# Patient Record
Sex: Male | Born: 1935 | Race: White | Hispanic: No | Smoking: Current every day smoker
Health system: Southern US, Community
[De-identification: ages and names within clinical notes are randomized; demographics above are authoritative.]

## PROBLEM LIST (undated history)

## (undated) DIAGNOSIS — M1990 Unspecified osteoarthritis, unspecified site: Secondary | ICD-10-CM

## (undated) DIAGNOSIS — I10 Essential (primary) hypertension: Secondary | ICD-10-CM

## (undated) DIAGNOSIS — K219 Gastro-esophageal reflux disease without esophagitis: Secondary | ICD-10-CM

## (undated) DIAGNOSIS — F449 Dissociative and conversion disorder, unspecified: Secondary | ICD-10-CM

## (undated) DIAGNOSIS — F442 Dissociative stupor: Secondary | ICD-10-CM

## (undated) DIAGNOSIS — F419 Anxiety disorder, unspecified: Secondary | ICD-10-CM

## (undated) DIAGNOSIS — I2510 Atherosclerotic heart disease of native coronary artery without angina pectoris: Secondary | ICD-10-CM

## (undated) DIAGNOSIS — G4730 Sleep apnea, unspecified: Secondary | ICD-10-CM

## (undated) DIAGNOSIS — R569 Unspecified convulsions: Secondary | ICD-10-CM

## (undated) DIAGNOSIS — I639 Cerebral infarction, unspecified: Secondary | ICD-10-CM

## (undated) HISTORY — PX: TONSILLECTOMY: SUR1361

## (undated) HISTORY — PX: OTHER SURGICAL HISTORY: SHX169

---

## 1992-02-17 DIAGNOSIS — G47411 Narcolepsy with cataplexy: Secondary | ICD-10-CM | POA: Insufficient documentation

## 1992-02-17 DIAGNOSIS — F602 Antisocial personality disorder: Secondary | ICD-10-CM | POA: Insufficient documentation

## 2000-03-03 DIAGNOSIS — M779 Enthesopathy, unspecified: Secondary | ICD-10-CM | POA: Insufficient documentation

## 2011-10-07 ENCOUNTER — Encounter (HOSPITAL_COMMUNITY): Payer: Self-pay | Admitting: Emergency Medicine

## 2011-10-07 ENCOUNTER — Emergency Department (HOSPITAL_COMMUNITY)
Admission: EM | Admit: 2011-10-07 | Discharge: 2011-10-08 | Disposition: A | Discharge status: Home / Self Care | Payer: Medicare Other | Attending: Emergency Medicine | Admitting: Emergency Medicine

## 2011-10-07 DIAGNOSIS — R10819 Abdominal tenderness, unspecified site: Secondary | ICD-10-CM | POA: Insufficient documentation

## 2011-10-07 DIAGNOSIS — R339 Retention of urine, unspecified: Secondary | ICD-10-CM | POA: Insufficient documentation

## 2011-10-07 HISTORY — DX: Dissociative and conversion disorder, unspecified: F44.9

## 2011-10-07 HISTORY — DX: Gastro-esophageal reflux disease without esophagitis: K21.9

## 2011-10-07 HISTORY — DX: Dissociative stupor: F44.2

## 2011-10-07 HISTORY — DX: Sleep apnea, unspecified: G47.30

## 2011-10-07 HISTORY — DX: Anxiety disorder, unspecified: F41.9

## 2011-10-07 LAB — RAPID URINE DRUG SCREEN, HOSP PERFORMED
Amphetamines: NOT DETECTED
Barbiturates: NOT DETECTED
Benzodiazepines: NOT DETECTED
Cocaine: NOT DETECTED
Opiates: NOT DETECTED
Tetrahydrocannabinol: NOT DETECTED

## 2011-10-07 MED ORDER — TAMSULOSIN HCL 0.4 MG PO CAPS: 0.4000 mg | ORAL_CAPSULE | Freq: Every day | ORAL | Status: DC

## 2011-10-07 NOTE — ED Notes (Signed)
Pt stated that in his urine is cloudy and it is d/t the particles in his couch where his dog sleeps.  States he should just shoot his dog, but won't b/c he is closer to his dog than any human being.

## 2011-10-07 NOTE — ED Notes (Signed)
Pt acting very angry, verbally abusing the undersigned about not knowing about the history he is describing. This includes "cataplectic attacks", narcolepsy, and sleep apnea.  Apparently the "cataplectic attack" is what the pt was having when he was being assessed.  Pt seems to be oriented but goes into describing how he is a disabled vet, how he has been put through so much, and that "no one gives a damn"  That "my penis has been put through this so much and that the [place] and everyone else in the medical field is just there to get a paycheck"

## 2011-10-07 NOTE — Discharge Instructions (Signed)
Acute Urinary Retention, Male You have been seen by a caregiver today because of your inability to urinate (pass your water). This is a common problem in elderly males. As men age their prostates become larger and block the flow of urine from the bladder. This is usually a problem that has come on gradually. It is often first noticed by having to get up at night to urinate. This is because as the prostate enlarges it is more difficult to empty the bladder completely. Treatment may involve a one time catheterization to empty the bladder. This is putting in a tube to drain your urine. Then you and your personal caregiver can decide at your earliest convenience how to handle this problem in the future. It may also be a problem that may not recur for years. Sometimes this problem can be caused by medications. In this case, all that is often necessary is to discontinue the offending agent. If you are to leave the foley catheter (a long, narrow, hollow tube) in and go home with a drainage system, you will need to discuss the best course of action with your caregiver. While the catheter is in, maintain a good intake of fluids. Keep the drainage bag emptied and lower than your catheter. This is so contaminated (infected) urine will not be flowing back into your bladder. This could lead to a urinary tract infection. Only take over-the-counter or prescription medicines for pain, discomfort, or fever as directed by your caregiver.  SEEK IMMEDIATE MEDICAL CARE IF:  You develop chills, fever, or show signs of generalized illness that occurs prior to seeing your caregiver. Document Released: 10/06/2000 Document Revised: 06/19/2011 Document Reviewed: 06/21/2008 Exit[employer] Patient Information 2012 [employer]. 

## 2011-10-07 NOTE — ED Provider Notes (Signed)
History    46 male with urinary retention. Ongoing issue for past 2 weeks. Has had foley catheter placed at times. Most recently removed himself today around 1900 because he was frustrated with it. Pt states that he has had care for this at multiple different facilities including ER in [place], [place] [facility] and  [facility]. Has not noticed any blood in urine. No back pain. No fever or chills. No n/v.  CSN: [id number]  Arrival date & time 10/07/11  2131   First MD Initiated Contact with Patient 10/07/11 2233      Chief Complaint  Patient presents with  . Urinary Retention    (Consider location/radiation/quality/duration/timing/severity/associated sxs/prior treatment) HPI  Past Medical History  Diagnosis Date  . Acid reflux   . Anxiety   . Sleep apnea   . Narcolepsy     Past Surgical History  Procedure Date  . Arm surgery      History reviewed. No pertinent family history.  History  Substance Use Topics  . Smoking status: Never Smoker   . Smokeless tobacco: Not on file  . Alcohol Use: No      Review of Systems   Review of symptoms negative unless otherwise noted in HPI.   Allergies  Review of patient's allergies indicates no known allergies.  Home Medications  No current outpatient prescriptions on file.  BP 149/93  Pulse 110  Temp(Src) 98.1 F (36.7 C) (Oral)  Resp 20  SpO2 98%  Physical Exam  Nursing note and vitals reviewed. Constitutional: He appears well-developed and well-nourished. No distress.  HENT:  Head: Normocephalic and atraumatic.  Eyes: Conjunctivae are normal. Right eye exhibits no discharge. Left eye exhibits no discharge.  Neck: Neck supple.  Cardiovascular: Normal rate, regular rhythm and normal heart sounds.  Exam reveals no gallop and no friction rub.   No murmur heard. Pulmonary/Chest: Effort normal and breath sounds normal. No respiratory distress.  Abdominal: Soft. He exhibits no distension. There is tenderness.        Mild lower abdominal tenderness without rebound or guarding.  Genitourinary:       Normal appearing external male genitalia. No penile discharge. No inguinal adenopathy. No external lesions noted. No testicular tenderness. No scrotal edema.  Musculoskeletal: He exhibits no edema and no tenderness.  Neurological: He is alert.  Skin: Skin is warm and dry.  Psychiatric:       Very bizarre affect. Will become very quiet at times and speak softly. Says having "cataplectic attacks."    ED Course  Procedures (including critical care time)   Labs Reviewed  URINALYSIS, ROUTINE W REFLEX MICROSCOPIC  URINE RAPID DRUG SCREEN (HOSP PERFORMED)   No results found.   1. Urinary retention       MDM  76yM with urinary retention. Foley placed. Pt reports seeing [facility] 2 weeks ago although no recent notes seen in system. Question reliability of pt as historian. Catheter care discussed. Referral for urology provided. Tamsulosin. If UA suggestive of infection will also tx with abx.         Raeford Razor, MD 10/07/11 2351

## 2011-10-07 NOTE — ED Notes (Signed)
Pt states he is having urinary retention  Pt states he had a catheter in place but it was not draining so he removed it himself and has not been able to urinate since

## 2011-10-08 LAB — URINALYSIS, ROUTINE W REFLEX MICROSCOPIC
Bilirubin Urine: NEGATIVE
Glucose, UA: NEGATIVE mg/dL
Hgb urine dipstick: NEGATIVE
Ketones, ur: NEGATIVE mg/dL
Nitrite: NEGATIVE
Protein, ur: NEGATIVE mg/dL
Specific Gravity, Urine: 1.024 (ref 1.005–1.030)
Urobilinogen, UA: 1 mg/dL (ref 0.0–1.0)
pH: 6 (ref 5.0–8.0)

## 2011-10-08 LAB — URINE MICROSCOPIC-ADD ON

## 2013-02-11 HISTORY — PX: ABDOMINAL AORTIC ANEURYSM REPAIR: SUR1152

## 2013-08-27 ENCOUNTER — Emergency Department (HOSPITAL_COMMUNITY): Payer: Medicare Other

## 2013-08-27 ENCOUNTER — Inpatient Hospital Stay (HOSPITAL_COMMUNITY)
Admission: EM | Admit: 2013-08-27 | Discharge: 2013-09-01 | DRG: 981 | Disposition: A | Discharge status: Home Health | Payer: Medicare Other | Attending: Family Medicine | Admitting: Family Medicine

## 2013-08-27 ENCOUNTER — Encounter (HOSPITAL_COMMUNITY): Payer: Self-pay | Admitting: Emergency Medicine

## 2013-08-27 DIAGNOSIS — H35039 Hypertensive retinopathy, unspecified eye: Secondary | ICD-10-CM | POA: Diagnosis present

## 2013-08-27 DIAGNOSIS — G47419 Narcolepsy without cataplexy: Secondary | ICD-10-CM | POA: Diagnosis present

## 2013-08-27 DIAGNOSIS — H3410 Central retinal artery occlusion, unspecified eye: Principal | ICD-10-CM | POA: Diagnosis present

## 2013-08-27 DIAGNOSIS — H5319 Other subjective visual disturbances: Secondary | ICD-10-CM | POA: Diagnosis present

## 2013-08-27 DIAGNOSIS — F419 Anxiety disorder, unspecified: Secondary | ICD-10-CM | POA: Diagnosis present

## 2013-08-27 DIAGNOSIS — F17200 Nicotine dependence, unspecified, uncomplicated: Secondary | ICD-10-CM | POA: Diagnosis present

## 2013-08-27 DIAGNOSIS — K219 Gastro-esophageal reflux disease without esophagitis: Secondary | ICD-10-CM | POA: Diagnosis present

## 2013-08-27 DIAGNOSIS — I2510 Atherosclerotic heart disease of native coronary artery without angina pectoris: Secondary | ICD-10-CM | POA: Diagnosis present

## 2013-08-27 DIAGNOSIS — I4891 Unspecified atrial fibrillation: Secondary | ICD-10-CM | POA: Diagnosis present

## 2013-08-27 DIAGNOSIS — Z9889 Other specified postprocedural states: Secondary | ICD-10-CM

## 2013-08-27 DIAGNOSIS — I639 Cerebral infarction, unspecified: Secondary | ICD-10-CM

## 2013-08-27 DIAGNOSIS — Z79899 Other long term (current) drug therapy: Secondary | ICD-10-CM

## 2013-08-27 DIAGNOSIS — I6350 Cerebral infarction due to unspecified occlusion or stenosis of unspecified cerebral artery: Secondary | ICD-10-CM | POA: Diagnosis present

## 2013-08-27 DIAGNOSIS — H3530 Unspecified macular degeneration: Secondary | ICD-10-CM | POA: Diagnosis present

## 2013-08-27 DIAGNOSIS — H5460 Unqualified visual loss, one eye, unspecified: Secondary | ICD-10-CM | POA: Diagnosis present

## 2013-08-27 DIAGNOSIS — E669 Obesity, unspecified: Secondary | ICD-10-CM | POA: Diagnosis present

## 2013-08-27 DIAGNOSIS — I161 Hypertensive emergency: Secondary | ICD-10-CM | POA: Diagnosis present

## 2013-08-27 DIAGNOSIS — I10 Essential (primary) hypertension: Secondary | ICD-10-CM | POA: Diagnosis present

## 2013-08-27 DIAGNOSIS — F411 Generalized anxiety disorder: Secondary | ICD-10-CM | POA: Diagnosis present

## 2013-08-27 DIAGNOSIS — Q211 Atrial septal defect: Secondary | ICD-10-CM

## 2013-08-27 DIAGNOSIS — H3411 Central retinal artery occlusion, right eye: Secondary | ICD-10-CM | POA: Diagnosis present

## 2013-08-27 DIAGNOSIS — Q2111 Secundum atrial septal defect: Secondary | ICD-10-CM

## 2013-08-27 DIAGNOSIS — E785 Hyperlipidemia, unspecified: Secondary | ICD-10-CM | POA: Diagnosis present

## 2013-08-27 DIAGNOSIS — H2510 Age-related nuclear cataract, unspecified eye: Secondary | ICD-10-CM | POA: Diagnosis present

## 2013-08-27 DIAGNOSIS — G4730 Sleep apnea, unspecified: Secondary | ICD-10-CM

## 2013-08-27 DIAGNOSIS — G4733 Obstructive sleep apnea (adult) (pediatric): Secondary | ICD-10-CM | POA: Diagnosis present

## 2013-08-27 HISTORY — DX: Cerebral infarction, unspecified: I63.9

## 2013-08-27 HISTORY — DX: Atherosclerotic heart disease of native coronary artery without angina pectoris: I25.10

## 2013-08-27 HISTORY — DX: Unspecified convulsions: R56.9

## 2013-08-27 HISTORY — DX: Unspecified osteoarthritis, unspecified site: M19.90

## 2013-08-27 HISTORY — DX: Essential (primary) hypertension: I10

## 2013-08-27 LAB — COMPREHENSIVE METABOLIC PANEL
ALBUMIN: 3.7 g/dL (ref 3.5–5.2)
ALT: 20 U/L (ref 0–53)
AST: 17 U/L (ref 0–37)
Alkaline Phosphatase: 107 U/L (ref 39–117)
BUN: 12 mg/dL (ref 6–23)
CALCIUM: 9 mg/dL (ref 8.4–10.5)
CHLORIDE: 106 meq/L (ref 96–112)
CO2: 26 mEq/L (ref 19–32)
CREATININE: 0.86 mg/dL (ref 0.50–1.35)
GFR calc Af Amer: >90 mL/min (ref >90)
GFR calc non Af Amer: 82 mL/min — ABNORMAL LOW (ref >90)
Glucose, Bld: 106 mg/dL — ABNORMAL HIGH (ref 70–99)
Potassium: 3.9 mEq/L (ref 3.7–5.3)
SODIUM: 144 meq/L (ref 137–147)
Total Bilirubin: 0.3 mg/dL (ref 0.3–1.2)
Total Protein: 6.4 g/dL (ref 6.0–8.3)

## 2013-08-27 LAB — CBC WITH DIFFERENTIAL/PLATELET
BASOS ABS: 0 10*3/uL (ref 0.0–0.1)
BASOS PCT: 0 % (ref 0–1)
EOS PCT: 3 % (ref 0–5)
Eosinophils Absolute: 0.2 10*3/uL (ref 0.0–0.7)
HEMATOCRIT: 38.8 % — AB (ref 39.0–52.0)
Hemoglobin: 13.5 g/dL (ref 13.0–17.0)
LYMPHS PCT: 25 % (ref 12–46)
Lymphs Abs: 1.5 10*3/uL (ref 0.7–4.0)
MCH: 29.8 pg (ref 26.0–34.0)
MCHC: 34.8 g/dL (ref 30.0–36.0)
MCV: 85.7 fL (ref 78.0–100.0)
MONO ABS: 0.4 10*3/uL (ref 0.1–1.0)
Monocytes Relative: 6 % (ref 3–12)
NEUTROS ABS: 4 10*3/uL (ref 1.7–7.7)
Neutrophils Relative %: 66 % (ref 43–77)
PLATELETS: 217 10*3/uL (ref 150–400)
RBC: 4.53 MIL/uL (ref 4.22–5.81)
RDW: 14.3 % (ref 11.5–15.5)
WBC: 6.1 10*3/uL (ref 4.0–10.5)

## 2013-08-27 LAB — PROTIME-INR
INR: 1.06 (ref 0.00–1.49)
Prothrombin Time: 13.6 seconds (ref 11.6–15.2)

## 2013-08-27 MED ORDER — LABETALOL HCL 5 MG/ML IV SOLN
10.0000 mg | Freq: Once | INTRAVENOUS | Status: AC
  Administered 2013-08-27: 10 mg via INTRAVENOUS
  Filled 2013-08-27: qty 4

## 2013-08-27 MED ORDER — HYDRALAZINE HCL 25 MG PO TABS
25.0000 mg | ORAL_TABLET | Freq: Four times a day (QID) | ORAL | Status: DC
  Administered 2013-08-28 (×2): 25 mg via ORAL
  Filled 2013-08-27 (×6): qty 1

## 2013-08-27 MED ORDER — HYDRALAZINE HCL 20 MG/ML IJ SOLN
10.0000 mg | Freq: Four times a day (QID) | INTRAMUSCULAR | Status: DC | PRN
  Administered 2013-08-28: 10 mg via INTRAVENOUS
  Filled 2013-08-27: qty 1

## 2013-08-27 MED ORDER — VENLAFAXINE HCL ER 37.5 MG PO CP24
37.5000 mg | ORAL_CAPSULE | Freq: Every day | ORAL | Status: DC
Start: 2013-08-28 — End: 2013-08-29
  Administered 2013-08-28 (×2): 37.5 mg via ORAL
  Filled 2013-08-27 (×3): qty 1

## 2013-08-27 MED ORDER — NITROGLYCERIN 0.4 MG SL SUBL: 0.4000 mg | SUBLINGUAL_TABLET | SUBLINGUAL | Status: DC | PRN

## 2013-08-27 MED ORDER — TAMSULOSIN HCL 0.4 MG PO CAPS
0.4000 mg | ORAL_CAPSULE | Freq: Every day | ORAL | Status: DC
  Administered 2013-08-28 – 2013-08-31 (×6): 0.4 mg via ORAL
  Filled 2013-08-27 (×8): qty 1

## 2013-08-27 MED ORDER — BRIMONIDINE TARTRATE 0.2 % OP SOLN
1.0000 [drp] | Freq: Two times a day (BID) | OPHTHALMIC | Status: DC
  Administered 2013-08-27: 1 [drp] via OPHTHALMIC
  Filled 2013-08-27: qty 5

## 2013-08-27 NOTE — ED Notes (Addendum)
Pt in c/o vision changes, states he lost most of his vision in his right eye last night and is having vision changes to his left eye, denies any other symptoms, denies any pain with this.

## 2013-08-27 NOTE — ED Notes (Signed)
Patient very blunt and to the point.

## 2013-08-27 NOTE — ED Notes (Signed)
No change in pt.s vision.

## 2013-08-27 NOTE — ED Provider Notes (Signed)
CSN: [id number]     Arrival date & time 08/27/13  1558 History   First MD Initiated Contact with Patient 08/27/13 1607     Chief Complaint  Patient presents with  . Vision changes     HPI Comments: 79 yo M hx of macular degeneration, narcolepsy, anxiety, CAD, presents with CC of vision loss.  Symptoms started yesterday evening.  Pt had sudden onset loss of vision in right eye, initially painless, with description of flashing colors, undulating form in visual field.  Pt states he has only preserved vision in extreme right periphery.  Symptoms did not improve over the course of the day, and decided to come to ED for evaluation.  Pt states he has a little pain behind right eye currently, and that his left eye was blurry this morning, although that has now resolved.  Pt denies previous occurrence.  He denies headache, fever, paresthesias, or focal deficits.  He denies recent illness.  He mentions hx of macular degeneration, but states he does not take any medications for this.    The history is provided by the patient. No language interpreter was used.    Past Medical History  Diagnosis Date  . Acid reflux   . Anxiety   . Sleep apnea   . Narcolepsy   . Catalepsy   . Narcolepsy   . Sleep apnea    Past Surgical History  Procedure Laterality Date  . Arm surgery      History reviewed. No pertinent family history. History  Substance Use Topics  . Smoking status: Never Smoker   . Smokeless tobacco: Not on file  . Alcohol Use: No    Review of Systems  Constitutional: Negative for fever and chills.  Eyes: Positive for pain and visual disturbance. Negative for photophobia, discharge, redness and itching.  Respiratory: Negative for cough and shortness of breath.   Cardiovascular: Negative for chest pain.  Gastrointestinal: Negative for nausea, vomiting, abdominal pain, diarrhea and constipation.  Musculoskeletal: Negative for myalgias.  Skin: Negative for rash.  Neurological: Negative for  dizziness, weakness, light-headedness, numbness and headaches.  Hematological: Negative for adenopathy. Does not bruise/bleed easily.  All other systems reviewed and are negative.      Allergies  Review of patient's allergies indicates no known allergies.  Home Medications  No current outpatient prescriptions on file. BP 208/100  Pulse 77  Temp(Src) 97.3 F (36.3 C) (Oral)  Resp 20  SpO2 98% Physical Exam  Nursing note and vitals reviewed. Constitutional: He is oriented to person, place, and time. He appears well-developed and well-nourished.  HENT:  Head: Normocephalic and atraumatic.  Right Ear: External ear normal.  Left Ear: External ear normal.  Nose: Nose normal.  Mouth/Throat: Oropharynx is clear and moist.  Eyes: Conjunctivae and EOM are normal. Pupils are equal, round, and reactive to light.  OD absent vision.  OS 20/30.  Pupils are 3 mm, equal, round, reactive bilaterally.  Fundoscopic exam is limited at bedside.  No conjunctival injection, proptosis, or signs of infection.  Neck: Normal range of motion. Neck supple.  Cardiovascular: Normal rate, normal heart sounds and intact distal pulses.   Pulmonary/Chest: Effort normal and breath sounds normal. No respiratory distress. He has no wheezes. He has no rales. He exhibits no tenderness.  Abdominal: Soft. Bowel sounds are normal. He exhibits no distension and no mass. There is no tenderness. There is no rebound and no guarding.  Musculoskeletal: Normal range of motion.  Neurological: He is alert and oriented  to person, place, and time.  No sensory or motor deficits on exam.  With exception of right eye visual loss, CNs are grossly intact.  Skin: Skin is warm and dry.    ED Course  Procedures (including critical care time) Labs Review Labs Reviewed  CBC WITH DIFFERENTIAL - Abnormal; Notable for the following:    HCT 38.8 (*)    All other components within normal limits  COMPREHENSIVE METABOLIC PANEL - Abnormal;  Notable for the following:    Glucose, Bld 106 (*)    GFR calc non Af Amer 82 (*)    All other components within normal limits  PROTIME-INR   Imaging Review Ct Head Wo Contrast  08/27/2013   CLINICAL DATA:  Vision loss involving the right eye  EXAM: CT HEAD WITHOUT CONTRAST  TECHNIQUE: Contiguous axial images were obtained from the base of the skull through the vertex without intravenous contrast.  COMPARISON:  CT HEAD W/O CM dated 02/12/2012; CT HEAD W/O CM dated 09/19/2011  FINDINGS: Mild atrophy with sulcal prominence. Scattered periventricular hypodensities compatible of microvascular ischemic disease. Given background parenchymal abnormalities, there is no CT evidence of acute large territory infarct. No intraparenchymal or extra-axial mass or hemorrhage. Unchanged size and configuration of the ventricles and basilar cisterns. No midline shift. Intracranial atherosclerosis. Limited visualization of the paranasal sinuses and mastoid air cells is normal. Regional soft tissues appears normal. No displaced calvarial fracture.  IMPRESSION: Mild atrophy and microvascular ischemic disease without acute intracranial process.   Electronically Signed   By: Sandi Mariscal M.D.   On: 08/27/2013 17:52    EKG Interpretation   None       MDM   Final diagnoses:  None   79 yo M hx of macular degeneration, narcolepsy, anxiety, CAD, presents with CC of vision loss.  Filed Vitals:   08/28/13 0030  BP: 126/59  Pulse: 73  Temp:   Resp: 17   Physical exam as above.  Pt with visual loss in right eye, with only peripheral sparing.  Concern for retinal detachment, CRAO, CRVO, vitreous hemorrhage.  Fundoscopic exam limited with bedside ophthalmoscope, nondilated eyes.  Right eye [place] did not reveal obvious retinal detachment, normal lens.  CT head demonstrated mild atrophy, microvascular ischemic disease, without acute intracranial process.  Labs WNL.  Opthalmology c/s, and examined pt at bedside.  BP high of 161  systolic in ED.  Diagnosis of CRAO 2/2 HTN emergency.  No acute intervention required, f/u schedule for pt in outpatient ophthalmology clinic.    Pt with CRAO 2/2 hypertensive emergency.  Pt given labetalol 10 mg IV.  Medicine consulted, and pt to be admitted to hospital for hypertensive emergency, blood pressure control.  Pt and family understand and agree with plan.  Sinda Du, MD     Sinda Du, MD 08/28/13 [phone]

## 2013-08-27 NOTE — Consult Note (Signed)
Reason for Consult: Acute VA loss od  Referring Physician: Alessander Sikorski is an 79 y.o. male.  HPI: CC: Acute painless VA loss OD c Photopsia and residual peripheral VA .  Past Medical History  Diagnosis Date  . Acid reflux   . Anxiety   . Sleep apnea   . Narcolepsy   . Catalepsy   . Narcolepsy   . Sleep apnea     Past Surgical History  Procedure Laterality Date  . Arm surgery       History reviewed. No pertinent family history.  Social History:  reports that he has never smoked. He does not have any smokeless tobacco history on file. He reports that he does not drink alcohol or use illicit drugs.  Allergies: No Known Allergies  Medications: I have reviewed the patient's current medications.  Results for orders placed during the hospital encounter of 08/27/13 (from the past 48 hour(s))  CBC WITH DIFFERENTIAL     Status: Abnormal   Collection Time    08/27/13  5:23 PM      Result Value Ref Range   WBC 6.1  4.0 - 10.5 K/uL   RBC 4.53  4.22 - 5.81 MIL/uL   Hemoglobin 13.5  13.0 - 17.0 g/dL   HCT 38.8 (*) 39.0 - 52.0 %   MCV 85.7  78.0 - 100.0 fL   MCH 29.8  26.0 - 34.0 pg   MCHC 34.8  30.0 - 36.0 g/dL   RDW 14.3  11.5 - 15.5 %   Platelets 217  150 - 400 K/uL   Neutrophils Relative % 66  43 - 77 %   Neutro Abs 4.0  1.7 - 7.7 K/uL   Lymphocytes Relative 25  12 - 46 %   Lymphs Abs 1.5  0.7 - 4.0 K/uL   Monocytes Relative 6  3 - 12 %   Monocytes Absolute 0.4  0.1 - 1.0 K/uL   Eosinophils Relative 3  0 - 5 %   Eosinophils Absolute 0.2  0.0 - 0.7 K/uL   Basophils Relative 0  0 - 1 %   Basophils Absolute 0.0  0.0 - 0.1 K/uL  COMPREHENSIVE METABOLIC PANEL     Status: Abnormal   Collection Time    08/27/13  5:23 PM      Result Value Ref Range   Sodium 144  137 - 147 mEq/L   Potassium 3.9  3.7 - 5.3 mEq/L   Chloride 106  96 - 112 mEq/L   CO2 26  19 - 32 mEq/L   Glucose, Bld 106 (*) 70 - 99 mg/dL   BUN 12  6 - 23 mg/dL   Creatinine, Ser 0.86  0.50 - 1.35 mg/dL     Calcium 9.0  8.4 - 10.5 mg/dL   Total Protein 6.4  6.0 - 8.3 g/dL   Albumin 3.7  3.5 - 5.2 g/dL   AST 17  0 - 37 U/L   ALT 20  0 - 53 U/L   Alkaline Phosphatase 107  39 - 117 U/L   Total Bilirubin 0.3  0.3 - 1.2 mg/dL   GFR calc non Af Amer 82 (*) >90 mL/min   GFR calc Af Amer >90  >90 mL/min   Comment: (NOTE)     The eGFR has been calculated using the CKD EPI equation.     This calculation has not been validated in all clinical situations.     eGFR's persistently <90 mL/min signify possible Chronic Kidney  Disease.  PROTIME-INR     Status: None   Collection Time    08/27/13  5:23 PM      Result Value Ref Range   Prothrombin Time 13.6  11.6 - 15.2 seconds   INR 1.06  0.00 - 1.49    Ct Head Wo Contrast  08/27/2013   CLINICAL DATA:  Vision loss involving the right eye  EXAM: CT HEAD WITHOUT CONTRAST  TECHNIQUE: Contiguous axial images were obtained from the base of the skull through the vertex without intravenous contrast.  COMPARISON:  CT HEAD W/O CM dated 02/12/2012; CT HEAD W/O CM dated 09/19/2011  FINDINGS: Mild atrophy with sulcal prominence. Scattered periventricular hypodensities compatible of microvascular ischemic disease. Given background parenchymal abnormalities, there is no CT evidence of acute large territory infarct. No intraparenchymal or extra-axial mass or hemorrhage. Unchanged size and configuration of the ventricles and basilar cisterns. No midline shift. Intracranial atherosclerosis. Limited visualization of the paranasal sinuses and mastoid air cells is normal. Regional soft tissues appears normal. No displaced calvarial fracture.  IMPRESSION: Mild atrophy and microvascular ischemic disease without acute intracranial process.   Electronically Signed   By: Sandi Mariscal M.D.   On: 08/27/2013 17:52    Review of Systems  Constitutional: Negative for fever and chills.  HENT: Positive for hearing loss.   Eyes: Positive for blurred vision. Negative for pain.       VA :  OD  : HM temp peripheral           OS :  20/80  N cc :   Pupils:  +ve  APD od:  Respiratory: Negative for cough, shortness of breath and wheezing.   Cardiovascular: Negative for chest pain.  Gastrointestinal: Negative for heartburn, vomiting and diarrhea.  Genitourinary: Positive for urgency and frequency.  Musculoskeletal: Negative for myalgias.  Skin: Negative for rash.  Neurological: Positive for dizziness. Negative for tingling, sensory change, seizures, weakness and headaches.  Endo/Heme/Allergies: Negative.    Blood pressure 187/105, pulse 72, temperature 98 F (36.7 C), temperature source Oral, resp. rate 19, SpO2 94.00%. Physical Exam  Constitutional: He is oriented to person, place, and time. He appears well-developed.  HENT:  Head: Normocephalic.  Eyes: Conjunctivae and EOM are normal. Pupils are equal, round, and reactive to light.    Neurological: He is alert and oriented to person, place, and time. No cranial nerve deficit.  Skin: Skin is warm.  Psychiatric: He has a normal mood and affect.    Assessment/Plan:  1: CRAO od :   Uncontrolled HTN :  2: HTN retinopathy ou   3: ARMD ou :  Macular chorioretinopathy  Ou:  4: Nuclear sclerotic cataracts ou; Plan:  Systemic  BP control  [place] :0.1% : sig :1gtt od BID.  F/U @ [facility] d/c.  Tyrianna Lightle A 08/27/2013, 8:54 PM

## 2013-08-27 NOTE — ED Notes (Signed)
MD Vanita Panda notified of symptoms

## 2013-08-28 DIAGNOSIS — H3411 Central retinal artery occlusion, right eye: Secondary | ICD-10-CM | POA: Diagnosis present

## 2013-08-28 DIAGNOSIS — F411 Generalized anxiety disorder: Secondary | ICD-10-CM

## 2013-08-28 DIAGNOSIS — K219 Gastro-esophageal reflux disease without esophagitis: Secondary | ICD-10-CM | POA: Diagnosis present

## 2013-08-28 DIAGNOSIS — I10 Essential (primary) hypertension: Secondary | ICD-10-CM

## 2013-08-28 DIAGNOSIS — I6350 Cerebral infarction due to unspecified occlusion or stenosis of unspecified cerebral artery: Secondary | ICD-10-CM

## 2013-08-28 DIAGNOSIS — G4730 Sleep apnea, unspecified: Secondary | ICD-10-CM

## 2013-08-28 DIAGNOSIS — H3410 Central retinal artery occlusion, unspecified eye: Principal | ICD-10-CM

## 2013-08-28 DIAGNOSIS — F419 Anxiety disorder, unspecified: Secondary | ICD-10-CM | POA: Diagnosis present

## 2013-08-28 DIAGNOSIS — I517 Cardiomegaly: Secondary | ICD-10-CM

## 2013-08-28 LAB — CBC
HEMATOCRIT: 38.1 % — AB (ref 39.0–52.0)
Hemoglobin: 13.2 g/dL (ref 13.0–17.0)
MCH: 29.6 pg (ref 26.0–34.0)
MCHC: 34.6 g/dL (ref 30.0–36.0)
MCV: 85.4 fL (ref 78.0–100.0)
Platelets: 218 10*3/uL (ref 150–400)
RBC: 4.46 MIL/uL (ref 4.22–5.81)
RDW: 14.3 % (ref 11.5–15.5)
WBC: 5.2 10*3/uL (ref 4.0–10.5)

## 2013-08-28 LAB — BASIC METABOLIC PANEL
BUN: 11 mg/dL (ref 6–23)
CHLORIDE: 108 meq/L (ref 96–112)
CO2: 25 mEq/L (ref 19–32)
Calcium: 9 mg/dL (ref 8.4–10.5)
Creatinine, Ser: 0.9 mg/dL (ref 0.50–1.35)
GFR calc Af Amer: >90 mL/min (ref >90)
GFR calc non Af Amer: 81 mL/min — ABNORMAL LOW (ref >90)
Glucose, Bld: 103 mg/dL — ABNORMAL HIGH (ref 70–99)
Potassium: 4.1 mEq/L (ref 3.7–5.3)
SODIUM: 144 meq/L (ref 137–147)

## 2013-08-28 MED ORDER — HYDROMORPHONE HCL PF 1 MG/ML IJ SOLN
0.5000 mg | INTRAMUSCULAR | Status: DC | PRN
  Administered 2013-09-01: 1 mg via INTRAVENOUS
  Filled 2013-08-28: qty 1

## 2013-08-28 MED ORDER — OXYCODONE HCL 5 MG PO TABS
5.0000 mg | ORAL_TABLET | ORAL | Status: DC | PRN
  Administered 2013-08-31 – 2013-09-01 (×3): 5 mg via ORAL
  Filled 2013-08-28 (×3): qty 1

## 2013-08-28 MED ORDER — SENNOSIDES-DOCUSATE SODIUM 8.6-50 MG PO TABS
1.0000 | ORAL_TABLET | Freq: Every evening | ORAL | Status: DC | PRN
  Filled 2013-08-28: qty 1

## 2013-08-28 MED ORDER — BRIMONIDINE TARTRATE 0.2 % OP SOLN
1.0000 [drp] | Freq: Two times a day (BID) | OPHTHALMIC | Status: DC
Start: 2013-08-28 — End: 2013-09-01
  Administered 2013-08-28 – 2013-08-31 (×8): 1 [drp] via OPHTHALMIC
  Filled 2013-08-28: qty 5

## 2013-08-28 MED ORDER — ACETAMINOPHEN 650 MG RE SUPP: 650.0000 mg | Freq: Four times a day (QID) | RECTAL | Status: DC | PRN

## 2013-08-28 MED ORDER — SODIUM CHLORIDE 0.9 % IJ SOLN
3.0000 mL | Freq: Two times a day (BID) | INTRAMUSCULAR | Status: DC
  Administered 2013-08-28 – 2013-08-31 (×7): 3 mL via INTRAVENOUS

## 2013-08-28 MED ORDER — ACETAMINOPHEN 325 MG PO TABS
650.0000 mg | ORAL_TABLET | Freq: Four times a day (QID) | ORAL | Status: DC | PRN
  Administered 2013-08-28 (×2): 650 mg via ORAL
  Filled 2013-08-28 (×3): qty 2

## 2013-08-28 MED ORDER — ALUM & MAG HYDROXIDE-SIMETH 200-200-20 MG/5ML PO SUSP: 30.0000 mL | Freq: Four times a day (QID) | ORAL | Status: DC | PRN

## 2013-08-28 MED ORDER — ONDANSETRON HCL 4 MG/2ML IJ SOLN: 4.0000 mg | Freq: Four times a day (QID) | INTRAMUSCULAR | Status: DC | PRN

## 2013-08-28 MED ORDER — PANTOPRAZOLE SODIUM 40 MG PO TBEC
40.0000 mg | DELAYED_RELEASE_TABLET | Freq: Every day | ORAL | Status: DC
  Administered 2013-08-29 – 2013-08-30 (×2): 40 mg via ORAL
  Filled 2013-08-28 (×2): qty 1

## 2013-08-28 MED ORDER — ONDANSETRON HCL 4 MG PO TABS: 4.0000 mg | ORAL_TABLET | Freq: Four times a day (QID) | ORAL | Status: DC | PRN

## 2013-08-28 MED ORDER — SODIUM CHLORIDE 0.9 % IJ SOLN: 3.0000 mL | INTRAMUSCULAR | Status: DC | PRN

## 2013-08-28 MED ORDER — SODIUM CHLORIDE 0.9 % IV SOLN
250.0000 mL | INTRAVENOUS | Status: DC | PRN
Start: 2013-08-28 — End: 2013-09-01

## 2013-08-28 MED ORDER — ASPIRIN 325 MG PO TABS
325.0000 mg | ORAL_TABLET | Freq: Every day | ORAL | Status: DC
  Administered 2013-08-28 – 2013-09-01 (×5): 325 mg via ORAL
  Filled 2013-08-28 (×5): qty 1

## 2013-08-28 MED ORDER — NICOTINE 14 MG/24HR TD PT24
14.0000 mg | MEDICATED_PATCH | Freq: Every day | TRANSDERMAL | Status: DC
  Administered 2013-08-29 – 2013-08-31 (×2): 14 mg via TRANSDERMAL
  Filled 2013-08-28 (×5): qty 1

## 2013-08-28 MED ORDER — ALPRAZOLAM 0.25 MG PO TABS
0.5000 mg | ORAL_TABLET | Freq: Three times a day (TID) | ORAL | Status: DC | PRN
  Administered 2013-08-31: 0.5 mg via ORAL
  Filled 2013-08-28: qty 2

## 2013-08-28 MED ORDER — HYDRALAZINE HCL 25 MG PO TABS
25.0000 mg | ORAL_TABLET | Freq: Three times a day (TID) | ORAL | Status: DC
  Administered 2013-08-28 – 2013-08-29 (×4): 25 mg via ORAL
  Filled 2013-08-28 (×6): qty 1

## 2013-08-28 NOTE — H&P (Addendum)
[facility] History and Physical  Luke Allen [id number] DOB: 08/30/1934 DOA: 08/27/2013  Referring physician:  PCP: Pcp Not In System,  [place] in [place], Dr. Arsenio Loader Specialists:   Chief Complaint: Vision Loss in Right Eye  HPI: Luke Allen is a 79 y.o. male with a history of HTN who presents to the ED with complaints of vision los in his right eye which occurred at 6:30 pm.   He denied having any headaches or chest pain, and also denied having any Decreased LOC, or syncope.   He was evaluated ain the ED and Ophthalmology was consulted and saw him as well in the ED and prescribed Alphagan Ophthalmic drops BID, and he was diagnosed with Central Retinal Artery Occlusion and cataracts were seen in both eyes.   His blood pressures were found to be [id number] systolic and he was administered IV Labetalol x 1 with improvement in his blood pressure.   He was referred for medical admission.      Review of Systems:  Constitutional: No Weight Loss, No Weight Gain, Night Sweats, Fevers, Chills, Fatigue, or Generalized Weakness HEENT: No Headaches, Difficulty Swallowing,Tooth/Dental Problems,Sore Throat,  No Sneezing, Rhinitis, Ear Ache, Nasal Congestion, or Post Nasal Drip,  Cardio-vascular:  No Chest pain, Orthopnea, PND, Edema in lower extremities, Anasarca, +Dizziness, Palpitations  Resp: No Dyspnea, No DOE, No Productive Cough, No Non-Productive Cough, No Hemoptysis, No Change in Color of Mucus,  No Wheezing.    GI: No Heartburn, Indigestion, Abdominal Pain, Nausea, Vomiting, Diarrhea, Change in Bowel Habits,  Loss of Appetite  GU: No Dysuria, Change in Color of Urine, No Urgency or Frequency.  No flank pain.  Musculoskeletal: No Joint Pain or Swelling.  No Decreased Range of Motion. No Back Pain.  Neurologic: No Syncope, No Seizures, Muscle Weakness, Paresthesia, +Vision Disturbance or Loss, No Diplopia, No Vertigo, No Difficulty Walking,  Skin: No Rash or Lesions. Psych: No Change in Mood  or Affect. No Depression or Anxiety. No Memory loss. No Confusion or Hallucinations   Past Medical History  Diagnosis Date  . Acid reflux   . Anxiety   . Sleep apnea   . Narcolepsy   . Catalepsy   . Narcolepsy   . Sleep apnea   . Hypertension      Past Surgical History  Procedure Laterality Date  . Arm surgery          Prior to Admission medications   Medication Sig Start Date End Date Taking? Authorizing Provider  nitroGLYCERIN (NITROSTAT) 0.4 MG SL tablet Place 0.4 mg under the tongue every 5 (five) minutes as needed for chest pain.   Yes Historical Provider, MD  PRESCRIPTION MEDICATION Take 0.5 tablets by mouth 2 (two) times daily. Heart medication   Yes Historical Provider, MD  PRESCRIPTION MEDICATION Take 2 mg by mouth 2 (two) times daily. Heart medication   Yes Historical Provider, MD  tamsulosin (FLOMAX) 0.4 MG CAPS capsule Take 0.4 mg by mouth at bedtime.   Yes Historical Provider, MD  VENLAFAXINE HCL PO Take 150 mg by mouth 2 (two) times daily.   Yes Historical Provider, MD  VENLAFAXINE HCL PO Take 37.5 mg by mouth at bedtime. Take with 150 mg dose   Yes Historical Provider, MD      No Known Allergies   Social History:  reports that he has never smoked. He does not have any smokeless tobacco history on file. He reports that he does not drink alcohol or use illicit drugs.  Family History:          Father Died of [place]   Physical Exam:  GEN:  Elderly Obese 79 y.o. Caucasian male  examined  and in no acute distress; cooperative with exam Filed Vitals:   08/28/13 0000 08/28/13 0030 08/28/13 0107 08/28/13 0446  BP: 137/66 126/59 132/85 137/70  Pulse: 68 73 70 72  Temp:   98 F (36.7 C) 98.7 F (37.1 C)  TempSrc:   Oral Oral  Resp: 21 17 18 19   Weight:   108.999 kg (240 lb 4.8 oz)   SpO2: 92% 91% 90% 94%   Blood pressure 137/70, pulse 72, temperature 98.7 F (37.1 C), temperature source Oral, resp. rate 19, weight 108.999 kg (240 lb 4.8 oz), SpO2  94.00%. PSYCH: He is alert and oriented x4; does not appear anxious does not appear depressed; affect is normal HEENT: Normocephalic and Atraumatic, Mucous membranes pink; PERRLA; EOM intact; Fundi:  + Cataract Formation Bilaterally;  No scleral icterus, Nares: Patent, Oropharynx: Clear, Edentulous;  Neck:  FROM, no cervical lymphadenopathy nor thyromegaly or carotid bruit; no JVD; Breasts:: Not examined CHEST WALL: No tenderness CHEST: Normal respiration, clear to auscultation bilaterally HEART: Regular rate and rhythm; no murmurs rubs or gallops BACK: No kyphosis or scoliosis; no CVA tenderness ABDOMEN: Positive Bowel Sounds,  Obese, soft non-tender; no masses, no organomegaly, no pannus; no intertriginous candida. Rectal Exam: Not done EXTREMITIES: No cyanosis, clubbing or edema; no ulcerations. Genitalia: not examined PULSES: 2+ and symmetric SKIN: Normal hydration no rash or ulceration CNS:   Mental Status:  Alert, oriented, thought content appropriate. Speech fluent without evidence of aphasia. Able to follow 3 step commands without difficulty. In No obvious pain.  Cranial Nerves: Decreased Vision in Right Eye,  And Poor Hearing Bilaterally,  otherwise Cranial Nerves Intact,  Sensory and Motor Function are Intact.   Gait not Assessed.   Gait: deferred  Vascular: pulses palpable throughout    Labs on Admission:  Basic Metabolic Panel:  Recent Labs Lab 08/27/13 1723  NA 144  K 3.9  CL 106  CO2 26  GLUCOSE 106*  BUN 12  CREATININE 0.86  CALCIUM 9.0   Liver Function Tests:  Recent Labs Lab 08/27/13 1723  AST 17  ALT 20  ALKPHOS 107  BILITOT 0.3  PROT 6.4  ALBUMIN 3.7   No results found for this basename: LIPASE, AMYLASE,  in the last 168 hours No results found for this basename: AMMONIA,  in the last 168 hours CBC:  Recent Labs Lab 08/27/13 1723  WBC 6.1  NEUTROABS 4.0  HGB 13.5  HCT 38.8*  MCV 85.7  PLT 217   Cardiac Enzymes: No results found for  this basename: CKTOTAL, CKMB, CKMBINDEX, TROPONINI,  in the last 168 hours  BNP (last 3 results) No results found for this basename: PROBNP,  in the last 8760 hours CBG: No results found for this basename: GLUCAP,  in the last 168 hours  Radiological Exams on Admission: Ct Head Wo Contrast  08/27/2013   CLINICAL DATA:  Vision loss involving the right eye  EXAM: CT HEAD WITHOUT CONTRAST  TECHNIQUE: Contiguous axial images were obtained from the base of the skull through the vertex without intravenous contrast.  COMPARISON:  CT HEAD W/O CM dated 02/12/2012; CT HEAD W/O CM dated 09/19/2011  FINDINGS: Mild atrophy with sulcal prominence. Scattered periventricular hypodensities compatible of microvascular ischemic disease. Given background parenchymal abnormalities, there is no CT evidence of acute large territory infarct. No  intraparenchymal or extra-axial mass or hemorrhage. Unchanged size and configuration of the ventricles and basilar cisterns. No midline shift. Intracranial atherosclerosis. Limited visualization of the paranasal sinuses and mastoid air cells is normal. Regional soft tissues appears normal. No displaced calvarial fracture.  IMPRESSION: Mild atrophy and microvascular ischemic disease without acute intracranial process.   Electronically Signed   By: Sandi Mariscal M.D.   On: 08/27/2013 17:52     Assessment/Plan:   79 y.o. male with  Principal Problem:   Central retinal artery occlusion of right eye Active Problems:   Hypertensive emergency   Acid reflux   Anxiety   Sleep apnea   Cataracts    Tobacco Use Disorder.     1.  Central Retinal Artery Occlusion- Seen by Ophthalmologist and orders and recommendations given.  Placed on Alphagan eye Drops BID.  Monitor for further changes, control HTN.   CVA workup.    2.  HTN Emergency-  Given IV Labetolol x1, now has Hydralazine 25 mg PO q 6 hours and IV Hydralazine PRN.  Moniotr BPs.  Medications Need further verification.     3.  Acid  REflux-  Prtonix daily while in hospital.    4.  Anxiety-  Alprazolam 0.5 mg PO BID PRN.    5.  Sleep Apnea Hx  6. Tobacco Use Disorder- Nicotine patch while in hospital.  Counseled RE: Tobacco Cessation.    7.  Cataracts-  Follow up as Outpt for further evalauation and Rx of cataracts.     8.  DVT prophylaxis with SCDs.       Code Status:  FULL CODE  Family Communication:    No Family Present Disposition Plan:       Inpatient  Time spent:  [place] [facility] Pager [phone]  If 7PM-7AM, please contact night-coverage [email or web address] Password [email or web address] 08/28/2013, 5:49 AM

## 2013-08-28 NOTE — Progress Notes (Signed)
Echocardiogram 2D Echocardiogram has been performed.  Joelene Millin 08/28/2013, 10:45 AM

## 2013-08-28 NOTE — ED Provider Notes (Signed)
This patient was seen / evaluated in conjunction with the resident physician, Dr. Justin Mend.  The documentation is an accurate reflection of the encounter, and our thoughts / the patient's course.  On my exam, the patient was in no distress, but with no vision loss of the right eye, I initially conducted bedside ultrasound.  Bedside ultrasound procedure note Limited US of R eye performed w linear probe Consent verbal Indication vision loss Findings, cleansed in appropriate location, with appropriate motion, globally intact, no notable posterior globe findings. Images archived. Procedure well tolerated.  After the initial evaluation, ultrasound, with persistently altered vision we discussed the patient's case with our ophthalmologist, who consult in the emergency department. Patient was found to have a central retinal artery occlusion. With persistent hypertension patient received beta blocker, required admission for further evaluation and management.  Carmin Muskrat, MD 08/28/13 [phone]

## 2013-08-28 NOTE — Progress Notes (Signed)
Pt pass bedside swallow screen.

## 2013-08-28 NOTE — Progress Notes (Signed)
[facility] PROGRESS NOTE  Luke Allen WGN:[id number] DOB: [birth year] DOA: 08/27/2013 PCP: Pcp Not In System  Assessment/Plan: 1. CRAO -BP control -ophthalmology Consulted, appreciate input -continue Alphagan eye Drops  -MRI/MRA ordered on admission will FU  2. Hypertensive emergency -improved with IV labetalol -continue hydralazine TID  -start norvasc at DC  3. H/o CAD/PCI -home meds yet to be reconciled -start ASA, need to resume BB and other meds once reconciled  4. Anxiety -continue xanax PRN  5. Tobacco use -counseled  DVT proph: SCDs  Code Status: Full Code Family Communication: no family at bedside Disposition Plan: home tomorrow   Consultants:  Ophthalmology  HPI/Subjective: Still with Visual loss in R eye  Objective: Filed Vitals:   08/28/13 0446  BP: 137/70  Pulse: 72  Temp: 98.7 F (37.1 C)  Resp: 19    Intake/Output Summary (Last 24 hours) at 08/28/13 1306 Last data filed at 08/28/13 0451  Gross per 24 hour  Intake      0 ml  Output    200 ml  Net   -200 ml   Filed Weights   08/28/13 0107  Weight: 108.999 kg (240 lb 4.8 oz)    Exam:   General:  AAO  HEENT: PERRLA  Cardiovascular: S1S2/RRR  Respiratory: CTAB  Abdomen: soft, NT, BS present  Musculoskeletal: no edema c/c   Data Reviewed: Basic Metabolic Panel:  Recent Labs Lab 08/27/13 1723 08/28/13 0600  NA 144 144  K 3.9 4.1  CL 106 108  CO2 26 25  GLUCOSE 106* 103*  BUN 12 11  CREATININE 0.86 0.90  CALCIUM 9.0 9.0   Liver Function Tests:  Recent Labs Lab 08/27/13 1723  AST 17  ALT 20  ALKPHOS 107  BILITOT 0.3  PROT 6.4  ALBUMIN 3.7   No results found for this basename: LIPASE, AMYLASE,  in the last 168 hours No results found for this basename: AMMONIA,  in the last 168 hours CBC:  Recent Labs Lab 08/27/13 1723 08/28/13 0600  WBC 6.1 5.2  NEUTROABS 4.0  --   HGB 13.5 13.2  HCT 38.8* 38.1*  MCV 85.7 85.4  PLT 217 218   Cardiac  Enzymes: No results found for this basename: CKTOTAL, CKMB, CKMBINDEX, TROPONINI,  in the last 168 hours BNP (last 3 results) No results found for this basename: PROBNP,  in the last 8760 hours CBG: No results found for this basename: GLUCAP,  in the last 168 hours  No results found for this or any previous visit (from the past 240 hour(s)).   Studies: Ct Head Wo Contrast  08/27/2013   CLINICAL DATA:  Vision loss involving the right eye  EXAM: CT HEAD WITHOUT CONTRAST  TECHNIQUE: Contiguous axial images were obtained from the base of the skull through the vertex without intravenous contrast.  COMPARISON:  CT HEAD W/O CM dated 02/12/2012; CT HEAD W/O CM dated 09/19/2011  FINDINGS: Mild atrophy with sulcal prominence. Scattered periventricular hypodensities compatible of microvascular ischemic disease. Given background parenchymal abnormalities, there is no CT evidence of acute large territory infarct. No intraparenchymal or extra-axial mass or hemorrhage. Unchanged size and configuration of the ventricles and basilar cisterns. No midline shift. Intracranial atherosclerosis. Limited visualization of the paranasal sinuses and mastoid air cells is normal. Regional soft tissues appears normal. No displaced calvarial fracture.  IMPRESSION: Mild atrophy and microvascular ischemic disease without acute intracranial process.   Electronically Signed   By: Sandi Mariscal M.D.   On: 08/27/2013 17:52  Scheduled Meds: . brimonidine  1 drop Right Eye BID  . hydrALAZINE  25 mg Oral TID  . nicotine  14 mg Transdermal Daily  . pantoprazole  40 mg Oral Daily  . sodium chloride  3 mL Intravenous Q12H  . tamsulosin  0.4 mg Oral QHS  . venlafaxine XR  37.5 mg Oral QHS   Continuous Infusions:   Principal Problem:   Central retinal artery occlusion of right eye Active Problems:   Hypertensive emergency   Acid reflux   Anxiety   Sleep apnea    Time spent: 1min    [facility] Pager  [phone]. If 7PM-7AM, please contact night-coverage at [email or web address], password [facility] 08/28/2013, 1:06 PM  LOS: 1 day

## 2013-08-28 NOTE — Progress Notes (Signed)
VASCULAR LAB PRELIMINARY  PRELIMINARY  PRELIMINARY  PRELIMINARY  Carotid Dopplers completed.    Preliminary report:  1-39% ICA stenosis.  Vertebral artery flow is antegrade.  Tishia Maestre, RVT 08/28/2013, 11:57 AM

## 2013-08-29 ENCOUNTER — Inpatient Hospital Stay (HOSPITAL_COMMUNITY): Payer: Medicare Other

## 2013-08-29 DIAGNOSIS — I639 Cerebral infarction, unspecified: Secondary | ICD-10-CM | POA: Diagnosis present

## 2013-08-29 DIAGNOSIS — I6350 Cerebral infarction due to unspecified occlusion or stenosis of unspecified cerebral artery: Secondary | ICD-10-CM

## 2013-08-29 LAB — HEMOGLOBIN A1C
Hgb A1c MFr Bld: 5.6 % (ref <5.7)
Mean Plasma Glucose: 114 mg/dL (ref <117)

## 2013-08-29 LAB — LIPID PANEL
CHOL/HDL RATIO: 4.7 ratio
CHOLESTEROL: 182 mg/dL (ref 0–200)
HDL: 39 mg/dL — ABNORMAL LOW (ref >39)
LDL CALC: 108 mg/dL — AB (ref 0–99)
Triglycerides: 175 mg/dL — ABNORMAL HIGH (ref <150)
VLDL: 35 mg/dL (ref 0–40)

## 2013-08-29 MED ORDER — VENLAFAXINE HCL ER 150 MG PO CP24
150.0000 mg | ORAL_CAPSULE | Freq: Every day | ORAL | Status: DC
  Filled 2013-08-29: qty 1

## 2013-08-29 MED ORDER — ATORVASTATIN CALCIUM 20 MG PO TABS
20.0000 mg | ORAL_TABLET | Freq: Every day | ORAL | Status: DC
  Administered 2013-08-30 – 2013-08-31 (×2): 20 mg via ORAL
  Filled 2013-08-29 (×3): qty 1

## 2013-08-29 MED ORDER — METHYLPHENIDATE HCL 5 MG PO TABS
10.0000 mg | ORAL_TABLET | Freq: Every morning | ORAL | Status: DC
  Administered 2013-08-29 – 2013-09-01 (×4): 10 mg via ORAL
  Filled 2013-08-29 (×4): qty 2

## 2013-08-29 MED ORDER — AMLODIPINE BESYLATE 5 MG PO TABS
5.0000 mg | ORAL_TABLET | Freq: Every day | ORAL | Status: DC
  Administered 2013-08-29 – 2013-09-01 (×4): 5 mg via ORAL
  Filled 2013-08-29 (×4): qty 1

## 2013-08-29 MED ORDER — METOPROLOL TARTRATE 25 MG PO TABS
25.0000 mg | ORAL_TABLET | Freq: Two times a day (BID) | ORAL | Status: DC
  Administered 2013-08-29 – 2013-09-01 (×7): 25 mg via ORAL
  Filled 2013-08-29 (×9): qty 1

## 2013-08-29 MED ORDER — VENLAFAXINE HCL ER 37.5 MG PO CP24
37.5000 mg | ORAL_CAPSULE | Freq: Every day | ORAL | Status: DC
  Administered 2013-08-29 – 2013-08-31 (×3): 37.5 mg via ORAL
  Filled 2013-08-29 (×4): qty 1

## 2013-08-29 MED ORDER — METHYLPHENIDATE HCL 10 MG PO TABS: 10.0000 mg | ORAL_TABLET | Freq: Every morning | ORAL | Status: DC

## 2013-08-29 MED ORDER — VENLAFAXINE HCL ER 150 MG PO CP24
150.0000 mg | ORAL_CAPSULE | ORAL | Status: DC
  Administered 2013-08-29 – 2013-09-01 (×7): 150 mg via ORAL
  Filled 2013-08-29 (×8): qty 1

## 2013-08-29 MED ORDER — VENLAFAXINE HCL ER 150 MG PO CP24: 150.0000 mg | ORAL_CAPSULE | Freq: Two times a day (BID) | ORAL | Status: DC

## 2013-08-29 MED ORDER — HYDRALAZINE HCL 25 MG PO TABS
25.0000 mg | ORAL_TABLET | Freq: Two times a day (BID) | ORAL | Status: DC
  Administered 2013-08-29 – 2013-09-01 (×5): 25 mg via ORAL
  Filled 2013-08-29 (×9): qty 1

## 2013-08-29 MED ORDER — VENLAFAXINE HCL ER 150 MG PO CP24
150.0000 mg | ORAL_CAPSULE | Freq: Two times a day (BID) | ORAL | Status: DC
  Administered 2013-08-29: 150 mg via ORAL
  Filled 2013-08-29 (×2): qty 1

## 2013-08-29 NOTE — Consult Note (Signed)
Reason for Consult:Stroke Referring Physician: Rai  CC: Loss of vision  HPI: Luke Allen is an 79 y.o. male who presents to the ED on 08/27/2013 with complaints of vision los in his right eye which occurred at 6:30 pm. He denied having any headaches or chest pain, and also denied having any Decreased LOC, or syncope. He was evaluated ain the ED and Ophthalmology was consulted and saw him as well in the ED and prescribed Alphagan Ophthalmic drops BID, and he was diagnosed with Central Retinal Artery Occlusion and cataracts were seen in both eyes. His blood pressures were found to be 614'E systolic and he was administered IV Labetalol x 1 with improvement in his blood pressure. He was referred for medical admission.  Work has included a carotid doppler that shows no significant stenosis and an echocardiogram that was unremarkable.     Past Medical History  Diagnosis Date  . Acid reflux   . Anxiety   . Sleep apnea   . Narcolepsy   . Catalepsy   . Narcolepsy   . Sleep apnea   . Hypertension     Past Surgical History  Procedure Laterality Date  . Arm surgery       History reviewed. No pertinent family history.  Social History:  reports that he has never smoked. He does not have any smokeless tobacco history on file. He reports that he does not drink alcohol or use illicit drugs.  No Known Allergies  Medications:  I have reviewed the patient's current medications. Prior to Admission:  Prescriptions prior to admission  Medication Sig Dispense Refill  . bisacodyl (DULCOLAX) 5 MG EC tablet Take 5 mg by mouth daily as needed for moderate constipation.      . methylphenidate (RITALIN) 10 MG tablet Take 10 mg by mouth daily as needed (Seizure).      . metoprolol tartrate (LOPRESSOR) 25 MG tablet Take 25 mg by mouth 2 (two) times daily.      . nitroGLYCERIN (NITROSTAT) 0.4 MG SL tablet Place 0.4 mg under the tongue every 5 (five) minutes as needed for chest pain.      . tamsulosin (FLOMAX)  0.4 MG CAPS capsule Take 0.4 mg by mouth at bedtime.      Marland Kitchen venlafaxine XR (EFFEXOR-XR) 150 MG 24 hr capsule Take 150 mg by mouth 2 (two) times daily.      Marland Kitchen venlafaxine XR (EFFEXOR-XR) 37.5 MG 24 hr capsule Take 37.5 mg by mouth daily with breakfast.       Scheduled: . amLODipine  5 mg Oral Daily  . aspirin  325 mg Oral Daily  . brimonidine  1 drop Right Eye BID  . hydrALAZINE  25 mg Oral BID  . methylphenidate  10 mg Oral q morning - 10a  . metoprolol tartrate  25 mg Oral BID  . nicotine  14 mg Transdermal Daily  . pantoprazole  40 mg Oral Daily  . sodium chloride  3 mL Intravenous Q12H  . tamsulosin  0.4 mg Oral QHS  . venlafaxine XR  150 mg Oral BID    ROS: History obtained from the patient  General ROS: negative for - chills, fatigue, fever, night sweats, weight gain or weight loss Psychological ROS: negative for - behavioral disorder, hallucinations, memory difficulties, mood swings or suicidal ideation Ophthalmic ROS:  loss of vision in right eye, decreased vision intermittently in left eye ENT ROS: negative for - epistaxis, nasal discharge, oral lesions, sore throat, tinnitus or vertigo Allergy and  Immunology ROS: negative for - hives or itchy/watery eyes Hematological and Lymphatic ROS: negative for - bleeding problems, bruising or swollen lymph nodes Endocrine ROS: negative for - galactorrhea, hair pattern changes, polydipsia/polyuria or temperature intolerance Respiratory ROS: negative for - cough, hemoptysis, shortness of breath or wheezing Cardiovascular ROS: negative for - chest pain, dyspnea on exertion, edema or irregular heartbeat Gastrointestinal ROS: negative for - abdominal pain, diarrhea, hematemesis, nausea/vomiting or stool incontinence Genito-Urinary ROS: negative for - dysuria, hematuria, incontinence or urinary frequency/urgency Musculoskeletal ROS: negative for - joint swelling or muscular weakness Neurological ROS: as noted in HPI Dermatological ROS:  negative for rash and skin lesion changes  Physical Examination: Blood pressure 155/82, pulse 80, temperature 97.9 F (36.6 C), temperature source Oral, resp. rate 18, weight 108.999 kg (240 lb 4.8 oz), SpO2 94.00%.  Neurologic Examination Mental Status: Alert, oriented, thought content appropriate.  Speech fluent without evidence of aphasia.  Able to follow 3 step commands without difficulty. Cranial Nerves: II: Discs flat bilaterally; Visual fields grossly normal in the left eye, pupils equal, round, reactive to light and accommodation III,IV, VI: ptosis not present, extra-ocular motions intact bilaterally V,VII: smile symmetric, facial light touch sensation normal bilaterally VIII: hearing normal bilaterally IX,X: gag reflex present XI: bilateral shoulder shrug XII: midline tongue extension Motor: Right : Upper extremity   5/5    Left:     Upper extremity   5/5  Lower extremity   5/5     Lower extremity   5/5 Tone and bulk:normal tone throughout; no atrophy noted Sensory: Pinprick and light touch intact throughout, bilaterally Deep Tendon Reflexes: 2+ in the upper extremities and at the right knee.  DTR's absent otherwise.   Plantars: Right: downgoing   Left: downgoing Cerebellar: normal finger-to-nose and normal heel-to-shin test Gait: Unable to test CV: pulses palpable throughout     Laboratory Studies:   Basic Metabolic Panel:  Recent Labs Lab 08/27/13 1723 08/28/13 0600  NA 144 144  K 3.9 4.1  CL 106 108  CO2 26 25  GLUCOSE 106* 103*  BUN 12 11  CREATININE 0.86 0.90  CALCIUM 9.0 9.0    Liver Function Tests:  Recent Labs Lab 08/27/13 1723  AST 17  ALT 20  ALKPHOS 107  BILITOT 0.3  PROT 6.4  ALBUMIN 3.7   No results found for this basename: LIPASE, AMYLASE,  in the last 168 hours No results found for this basename: AMMONIA,  in the last 168 hours  CBC:  Recent Labs Lab 08/27/13 1723 08/28/13 0600  WBC 6.1 5.2  NEUTROABS 4.0  --   HGB 13.5  13.2  HCT 38.8* 38.1*  MCV 85.7 85.4  PLT 217 218    Cardiac Enzymes: No results found for this basename: CKTOTAL, CKMB, CKMBINDEX, TROPONINI,  in the last 168 hours  BNP: No components found with this basename: POCBNP,   CBG: No results found for this basename: GLUCAP,  in the last 168 hours  Microbiology: No results found for this or any previous visit.  Coagulation Studies:  Recent Labs  08/27/13 1723  LABPROT 13.6  INR 1.06    Urinalysis: No results found for this basename: COLORURINE, APPERANCEUR, LABSPEC, PHURINE, GLUCOSEU, HGBUR, BILIRUBINUR, KETONESUR, PROTEINUR, UROBILINOGEN, NITRITE, LEUKOCYTESUR,  in the last 168 hours  Lipid Panel:     Component Value Date/Time   CHOL 182 08/29/2013 0525   TRIG 175* 08/29/2013 0525   HDL 39* 08/29/2013 0525   CHOLHDL 4.7 08/29/2013 0525   VLDL 35  08/29/2013 0525   LDLCALC 108* 08/29/2013 0525    HgbA1C:  Lab Results  Component Value Date   HGBA1C 5.6 08/29/2013    Urine Drug Screen:     Component Value Date/Time   LABOPIA NONE DETECTED 10/07/2011 2330   COCAINSCRNUR NONE DETECTED 10/07/2011 2330   LABBENZ NONE DETECTED 10/07/2011 2330   AMPHETMU NONE DETECTED 10/07/2011 2330   THCU NONE DETECTED 10/07/2011 2330   LABBARB NONE DETECTED 10/07/2011 2330    Alcohol Level: No results found for this basename: ETH,  in the last 168 hours   Imaging: Ct Head Wo Contrast  08/27/2013   CLINICAL DATA:  Vision loss involving the right eye  EXAM: CT HEAD WITHOUT CONTRAST  TECHNIQUE: Contiguous axial images were obtained from the base of the skull through the vertex without intravenous contrast.  COMPARISON:  CT HEAD W/O CM dated 02/12/2012; CT HEAD W/O CM dated 09/19/2011  FINDINGS: Mild atrophy with sulcal prominence. Scattered periventricular hypodensities compatible of microvascular ischemic disease. Given background parenchymal abnormalities, there is no CT evidence of acute large territory infarct. No intraparenchymal or extra-axial mass  or hemorrhage. Unchanged size and configuration of the ventricles and basilar cisterns. No midline shift. Intracranial atherosclerosis. Limited visualization of the paranasal sinuses and mastoid air cells is normal. Regional soft tissues appears normal. No displaced calvarial fracture.  IMPRESSION: Mild atrophy and microvascular ischemic disease without acute intracranial process.   Electronically Signed   By: Sandi Mariscal M.D.   On: 08/27/2013 17:52   [name]  08/29/2013   CLINICAL DATA:  Hypertensive emergency. Right eye visual loss with suspected central retinal artery occlusion.  EXAM: MRI HEAD WITHOUT CONTRAST  MRA HEAD WITHOUT CONTRAST  TECHNIQUE: Multiplanar, multiecho pulse sequences of the brain and surrounding structures were obtained without intravenous contrast. Angiographic images of the head were obtained using MRA technique without contrast.  COMPARISON:  CT HEAD W/O CM dated 08/27/2013; CT HEAD W/O CM dated 02/12/2012  FINDINGS: MRI HEAD FINDINGS  Patient was unable to complete the full exam. No FLAIR, gradient, or T1 weighted images are obtained as part of the MR brain study.  Subcentimeter focus of restricted diffusion affects the left anterior parietal subcortical white matter consistent with acute infarction. Lower level restricted diffusion with beginning normalization of ADC signal, right posterior frontal subcortical white matter, consistent with subacute infarction.  Within limits of detection on this nonstandard exam, no visible hemorrhage, mass lesion, or extra-axial fluid.  Moderate ventriculomegaly without corresponding cortical atrophy. Normal pressure hydrocephalus not excluded, versus central atrophy. Increased T2 bright signal in the periventricular and subcortical white matter likely chronic microvascular ischemic change.  MRA HEAD FINDINGS  Dolichoectatic and widely patent internal carotid arteries and basilar artery. Vertebrals codominant without distal disease. No  proximal intracranial stenosis or aneurysm. Right anterior cerebral artery dominant. Mild irregularity of the distal MCA and PCA branches, likely intracranial atherosclerotic change.  IMPRESSION: Bilateral subcentimeter supratentorial infarcts, acute on the left affecting the left parietal subcortical white matter, and subacute on the right affecting the posterior frontal subcortical region.  No proximal intracranial stenosis or vascular occlusion.  Prematurely truncated exam reveals moderate ventriculomegaly ; normal-pressure hydrocephalus is a consideration versus central atrophy. Moderate chronic microvascular ischemic change affects the periventricular and subcortical white matter.   Electronically Signed   By: Rolla Flatten M.D.   On: 08/29/2013 09:13   [name] Head/brain Wo Cm  08/29/2013   CLINICAL DATA:  Hypertensive emergency. Right eye visual loss with  suspected central retinal artery occlusion.  EXAM: MRI HEAD WITHOUT CONTRAST  MRA HEAD WITHOUT CONTRAST  TECHNIQUE: Multiplanar, multiecho pulse sequences of the brain and surrounding structures were obtained without intravenous contrast. Angiographic images of the head were obtained using MRA technique without contrast.  COMPARISON:  CT HEAD W/O CM dated 08/27/2013; CT HEAD W/O CM dated 02/12/2012  FINDINGS: MRI HEAD FINDINGS  Patient was unable to complete the full exam. No FLAIR, gradient, or T1 weighted images are obtained as part of the MR brain study.  Subcentimeter focus of restricted diffusion affects the left anterior parietal subcortical white matter consistent with acute infarction. Lower level restricted diffusion with beginning normalization of ADC signal, right posterior frontal subcortical white matter, consistent with subacute infarction.  Within limits of detection on this nonstandard exam, no visible hemorrhage, mass lesion, or extra-axial fluid.  Moderate ventriculomegaly without corresponding cortical atrophy. Normal pressure hydrocephalus  not excluded, versus central atrophy. Increased T2 bright signal in the periventricular and subcortical white matter likely chronic microvascular ischemic change.  MRA HEAD FINDINGS  Dolichoectatic and widely patent internal carotid arteries and basilar artery. Vertebrals codominant without distal disease. No proximal intracranial stenosis or aneurysm. Right anterior cerebral artery dominant. Mild irregularity of the distal MCA and PCA branches, likely intracranial atherosclerotic change.  IMPRESSION: Bilateral subcentimeter supratentorial infarcts, acute on the left affecting the left parietal subcortical white matter, and subacute on the right affecting the posterior frontal subcortical region.  No proximal intracranial stenosis or vascular occlusion.  Prematurely truncated exam reveals moderate ventriculomegaly ; normal-pressure hydrocephalus is a consideration versus central atrophy. Moderate chronic microvascular ischemic change affects the periventricular and subcortical white matter.   Electronically Signed   By: Rolla Flatten M.D.   On: 08/29/2013 09:13     Assessment/Plan: 79 year old male presenting after loss of vision.  Central retinal artery occlusion diagnosed.  Further work has included an MRI of the brain. It has been reviewed and reveals a small left parietal and right posterior frontal acute infarcts.  Embolic etiology suspected.  Echo and carotid dopplers are unremarkable.  A1c is normal.  LDL elevated.  Recommendations: 1.  TEE 2.  Continue ASA 3.  Continue telemetry 4.  Stroke team to see in the morning.   5.  Frequent neuro checks  Alexis Goodell, MD [facility] [phone] 08/29/2013, 4:23 PM

## 2013-08-29 NOTE — Progress Notes (Signed)
Occupational Therapy Evaluation Patient Details Name: Luke Allen MRN: [id number] DOB: [birth year] Today's Date: 08/29/2013 Time: 8527-7824 OT Time Calculation (min): 37 min  OT Assessment / Plan / Recommendation History of present illness Pt adm due to loss of vision Rt eye. Diagnosed with Central Retinal Artery Occlusion and admitted due to SBP's >200. MRI Brain-Bilateral subcentimeter supratentorial infarcts, acute on the left. PMHx includes HTN, cataracts, and narcolepsy   Clinical Impression   PTA, pt lived alone and was independent with ADL and mobility. Difficult to complete eval, as pt falling asleep during eval due to apparent narcolepsy. Pt with poor vision R eye. Pt reports decreased vision L eye also 20/80) per Dr. Sunday Corn note. Pt will most likely perform better in his own home, however, unsure of support system to assist with IADL, shopping, cooking, driving etc. [place] to modifiy home due to visual deficits. Will follow and continue to monitor D/C disposition.     OT Assessment  Patient needs continued OT Services    Follow Up Recommendations  Other (comment) (May beneit from ALF)  Needs 24/7 A due to poor vision   Barriers to Discharge Decreased caregiver support    Equipment Recommendations  Tub/shower seat    Recommendations for Other Services    Frequency  Min 2X/week    Precautions / Restrictions Precautions Precautions: Fall Precaution Comments: fall risk due to decr vision   Pertinent Vitals/Pain no apparent distress     ADL  Toilet Transfer: Min Psychiatric nurse Method: Other (comment) (ambulating) Toileting - Clothing Manipulation and Hygiene: Supervision/safety (foley) Where Assessed - Toileting Clothing Manipulation and Hygiene: Sit to stand from 3-in-1 or toilet Transfers/Ambulation Related to ADLs: S ADL Comments: Pt slower with ADL due to loss of vision.    OT Diagnosis: Disturbance of vision;Blindness and low vision  OT  Problem List: Impaired vision/perception;Decreased safety awareness;Decreased knowledge of use of DME or AE OT Treatment Interventions: Self-care/ADL training;DME and/or AE instruction;Visual/perceptual remediation/compensation;Patient/family education;Therapeutic activities   OT Goals(Current goals can be found in the care plan section) Acute Rehab OT Goals Patient Stated Goal: to be ble to see OT Goal Formulation: With patient Time For Goal Achievement: 09/12/13 Potential to Achieve Goals: Good  Visit Information  Last OT Received On: 08/22/13 Assistance Needed: +1 History of Present Illness: Pt adm due to loss of vision Rt eye. Diagnosed with Central Retinal Artery Occlusion and admitted due to SBP's >200. MRI Brain-Bilateral subcentimeter supratentorial infarcts, acute on the left. PMHx includes HTN, cataracts, and narcolepsy       Prior Functioning     Home Living Family/patient expects to be discharged to:: Other (Comment) ([place]) Living Arrangements: Alone Available Help at Discharge: Friend(s);Neighbor;Available PRN/intermittently Type of Home: House Home Access: Stairs to enter [employer] of Steps: 5 Entrance Stairs-Rails: Right Home Layout: Multi-level;Able to live on main level with bedroom/bathroom Home Equipment: Other (comment) (walking stick) Prior Function Level of Independence: Independent with assistive device(s) Comments: reports balance has been getting worse over past several months; recently (?) discharged from an ALF where he received therapy and improved his balance Communication Communication: HOH Dominant Hand: Right         Vision/Perception Vision - History Baseline Vision: No visual deficits Patient Visual Report: Blurring of vision Vision - Assessment Eye Alignment: Within Functional Limits Vision Assessment: Vision impaired - to be further tested in functional context Additional Comments: Unable to completly assess  vision in funcitonal setting due to pt falling asleep  Cognition  Cognition Arousal/Alertness: Awake/alert Behavior During Therapy: WFL for tasks assessed/performed Overall Cognitive Status: Within Functional Limits for tasks assessed    Extremity/Trunk Assessment Upper Extremity Assessment Upper Extremity Assessment: Overall WFL for tasks assessed Lower Extremity Assessment Lower Extremity Assessment: Generalized weakness Cervical / Trunk Assessment Cervical / Trunk Assessment: Normal     Mobility Bed Mobility Overal bed mobility: Independent Transfers Overall transfer level: Needs assistance Equipment used: None;1 person hand held assist General transfer comment: Pt with posterior sway in standing at sink     Exercise     Balance Balance Overall balance assessment: Modified Independent Standardized Balance Assessment Standardized Balance Assessment : Berg Balance Test Berg Balance Test Sit to Stand: Able to stand  independently using hands Standing Unsupported: Able to stand safely 2 minutes Sitting with Back Unsupported but Feet Supported on Floor or Stool: Able to sit safely and securely 2 minutes Stand to Sit: Sits safely with minimal use of hands Transfers: Able to transfer safely, minor use of hands Standing Unsupported with Eyes Closed: Able to stand 10 seconds safely Standing Ubsupported with Feet Together: Able to place feet together independently and stand 1 minute safely From Standing, Reach Forward with Outstretched Arm: Can reach confidently >25 cm (10") From Standing Position, Pick up Object from Floor: Able to pick up shoe safely and easily From Standing Position, Turn to Look Behind Over each Shoulder: Looks behind from both sides and weight shifts well Turn 360 Degrees: Able to turn 360 degrees safely in 4 seconds or less General Comments General comments (skin integrity, edema, etc.): Pt began falling asleep on EOB   End of Session OT - End of  Session Equipment Utilized During Treatment: Gait belt Activity Tolerance: Patient limited by lethargy Patient left: in bed;with call bell/phone within reach Nurse Communication: Mobility status;Precautions  GO     [facility] 08/29/2013, 3:44 PM [facility], OTR/L  [phone] 08/29/2013

## 2013-08-29 NOTE — Progress Notes (Signed)
Placed patient on CPAP at 10cm for the night

## 2013-08-29 NOTE — Evaluation (Signed)
Physical Therapy Evaluation Patient Details Name: Luke Allen MRN: [id number] DOB: [birth year] Today's Date: 08/29/2013 Time: 7893-8101 PT Time Calculation (min): 41 min  PT Assessment / Plan / Recommendation History of Present Illness  Pt adm due to loss of vision Rt eye. Diagnosed with Central Retinal Artery Occlusion and admitted due to SBP's >200. MRI Brain-Bilateral subcentimeter supratentorial infarcts, acute on the left. PMHx includes HTN, cataracts, and narcolepsy   Clinical Impression  Patient evaluated by Physical Therapy with no further acute PT needs identified. All education has been completed and the patient has no further questions.  PT is signing off. Thank you for this referral.     PT Assessment  Patent does not need any further PT services    Follow Up Recommendations  No PT follow up    Does the patient have the potential to tolerate intense rehabilitation      Barriers to Discharge        Equipment Recommendations  None recommended by PT    Recommendations for Other Services     Frequency      Precautions / Restrictions Precautions Precautions: Fall Precaution Comments: fall risk due to decr vision   Pertinent Vitals/Pain Rt heel pain due to plantar wart; not rated 0-10      Mobility  Bed Mobility Overal bed mobility: Independent Transfers Overall transfer level: Modified independent Equipment used: None General transfer comment: Pt moves slowly with wide base of support "so I don't lose my balance. This is what they taught me." Ambulation/Gait Ambulation/Gait assistance: Supervision Ambulation Distance (Feet): 350 Feet (50, 100, 250 with seated rests) Assistive device: None Gait Pattern/deviations: Step-through pattern;Wide base of support Gait velocity interpretation: at or above normal speed for age/gender General Gait Details: pt scanning to his right to avoid multiple obstacles in the hallway; no loss of balance; slightly antalgic due to  "plantar wart Rt heel" per pt Modified Rankin (Stroke Patients Only) Pre-Morbid Rankin Score: No significant disability Modified Rankin: Slight disability    Exercises     PT Diagnosis:    PT Problem List:   PT Treatment Interventions:       PT Goals(Current goals can be found in the care plan section) Acute Rehab PT Goals PT Goal Formulation: No goals set, d/c therapy  Visit Information  Last PT Received On: 08/29/13 Assistance Needed: +1 History of Present Illness: Pt adm due to loss of vision Rt eye. Diagnosed with Central Retinal Artery Occlusion and admitted due to SBP's >200. MRI Brain-Bilateral subcentimeter supratentorial infarcts, acute on the left. PMHx includes HTN, cataracts, and narcolepsy       Prior Functioning  Home Living Family/patient expects to be discharged to:: Other (Comment) ([place]) Living Arrangements: Alone Available Help at Discharge: Friend(s);Neighbor;Available PRN/intermittently Type of Home: House Home Access: Stairs to enter [employer] of Steps: 5 Entrance Stairs-Rails: Right Home Layout: Multi-level;Able to live on main level with bedroom/bathroom Home Equipment: Other (comment) (walking stick) Prior Function Level of Independence: Independent with assistive device(s) Comments: reports balance has been getting worse over past several months; recently (?) discharged from an ALF where he received therapy and improved his balance Communication Communication: HOH Dominant Hand: Right    Cognition  Cognition Arousal/Alertness: Lethargic Behavior During Therapy: WFL for tasks assessed/performed Overall Cognitive Status: Within Functional Limits for tasks assessed    Extremity/Trunk Assessment Upper Extremity Assessment Upper Extremity Assessment: Defer to OT evaluation Lower Extremity Assessment Lower Extremity Assessment: Generalized weakness (coordination WNL) Cervical / Trunk Assessment Cervical /  Trunk Assessment:  Normal   Balance Balance Overall balance assessment: Modified Independent Standardized Balance Assessment Standardized Balance Assessment : Berg Balance Test Berg Balance Test Sit to Stand: Able to stand  independently using hands Standing Unsupported: Able to stand safely 2 minutes Sitting with Back Unsupported but Feet Supported on Floor or Stool: Able to sit safely and securely 2 minutes Stand to Sit: Sits safely with minimal use of hands Transfers: Able to transfer safely, minor use of hands Standing Unsupported with Eyes Closed: Able to stand 10 seconds safely Standing Ubsupported with Feet Together: Able to place feet together independently and stand 1 minute safely From Standing, Reach Forward with Outstretched Arm: Can reach confidently >25 cm (10") From Standing Position, Pick up Object from Floor: Able to pick up shoe safely and easily From Standing Position, Turn to Look Behind Over each Shoulder: Looks behind from both sides and weight shifts well Turn 360 Degrees: Able to turn 360 degrees safely in 4 seconds or less  End of Session PT - End of Session Equipment Utilized During Treatment: Gait belt Activity Tolerance: Patient tolerated treatment well Patient left: in bed;with call bell/phone within reach;with bed alarm set  GP     Trinka Keshishyan 08/29/2013, 3:08 PM Pager [phone]

## 2013-08-29 NOTE — Care Management Note (Addendum)
    Page 1 of 2   09/01/2013     3:49:32 PM   CARE MANAGEMENT NOTE 09/01/2013  Patient:  [facility]   Account Number:  [id number]  Date Initiated:  08/29/2013  Documentation initiated by:  Rafal Archuleta  Subjective/Objective Assessment:   PT ADM ON 08/27/13 WITH HTN CRISIS, RETINAL ARTERY OCCLUSION.  PTA, PT INDEPENDENT, LIVES WITH SIGNIFICANT OTHER.     Action/Plan:   [facility] [place] [place]  WILL FOLLOW FOR DC NEEDS AS PT PROGRESSES.   Anticipated DC Date:  09/01/2013   Anticipated DC Plan:  [place]  CM consult      [facility] Choice  HOME HEALTH   Choice offered to / List presented to:  C-1 Patient        [place] arranged  HH-1 RN  HH-3 OT  HH-6 SOCIAL WORKER      Status of service:  Completed, signed off Medicare Important Message given?   (If response is "NO", the following Medicare IM given date fields will be blank) Date Medicare IM given:   Date Additional Medicare IM given:    Discharge Disposition:  [place]  Per UR Regulation:  Reviewed for med. necessity/level of care/duration of stay  If discussed at [place] of Stay Meetings, dates discussed:   09/01/2013    Comments:  09/01/17 Kamerin Grumbine,RN,BSN [phone] PT [place], WHO ASSISTS WITH CARE.  HE DECLINES DC TO ASSISTED LIVING FACILITY.   PT WOULD BENEFIT FROM HH FOLLOW UP AT DISCHARGE, AND HE IS AGREEABLE TO THIS.  REFERRAL TO [facility], PER PT CHOICE.  START OF CARE 24-48H POST DC DATE.  08/29/13 Jalyn Rosero,RN,BSN [phone] NOTIFIED [facility] [place] OF PT'S ADMISSION.

## 2013-08-29 NOTE — Progress Notes (Addendum)
[facility] PROGRESS NOTE  Luke Allen [id number] DOB: Jan 08, 1935 DOA: 08/27/2013 PCP: Pcp Not In System  Assessment/Plan: 1. CRAO -BP control -seen by ophthalmology Dr.Spencer in ER appreciate input, needs FU -continue Alphagan eye Drops  -MRI this am positive for b/l subcentimeter infarcts  2. Bilateral CVA -likely embolic, noted on MRI -will request Neuro eval -continue ASA -ECHO completed and unremarkable -carotid duplex 1-39% stenosis -LDL 108, will start low dose statin -Fu hbaic  3. Hypertensive emergency -improved with IV labetalol/hydralazine on admission -continue hydralazine, change to BID  -start norvasc 5mg   4. H/o CAD/PCI -home meds yet to be reconciled -continue ASA,resumed BB and other meds once reconciled -pt reports getting meds from [place] [facility] and [place] at [facility]  5. Anxiety -continue xanax PRN  6. Tobacco use -counseled  7. OSA -CPAP Qhs  DVT proph: SCDs  Code Status: Full Code Family Communication: no family at bedside Disposition Plan: home tomorrow   Consultants:  Ophthalmology  HPI/Subjective: Still with Visual loss in R eye  Objective: Filed Vitals:   08/29/13 0901  BP: 168/81  Pulse: 72  Temp:   Resp: 16    Intake/Output Summary (Last 24 hours) at 08/29/13 1119 Last data filed at 08/29/13 0453  Gross per 24 hour  Intake    200 ml  Output   2075 ml  Net  -1875 ml   Filed Weights   08/28/13 0107  Weight: 108.999 kg (240 lb 4.8 oz)    Exam:   General:  AAO  HEENT: PERRLA  Cardiovascular: S1S2/RRR  Respiratory: CTAB  Abdomen: soft, NT, BS present  Musculoskeletal: no edema c/c   Data Reviewed: Basic Metabolic Panel:  Recent Labs Lab 08/27/13 1723 08/28/13 0600  NA 144 144  K 3.9 4.1  CL 106 108  CO2 26 25  GLUCOSE 106* 103*  BUN 12 11  CREATININE 0.86 0.90  CALCIUM 9.0 9.0   Liver Function Tests:  Recent Labs Lab 08/27/13 1723  AST 17  ALT 20  ALKPHOS 107  BILITOT 0.3   PROT 6.4  ALBUMIN 3.7   No results found for this basename: LIPASE, AMYLASE,  in the last 168 hours No results found for this basename: AMMONIA,  in the last 168 hours CBC:  Recent Labs Lab 08/27/13 1723 08/28/13 0600  WBC 6.1 5.2  NEUTROABS 4.0  --   HGB 13.5 13.2  HCT 38.8* 38.1*  MCV 85.7 85.4  PLT 217 218   Cardiac Enzymes: No results found for this basename: CKTOTAL, CKMB, CKMBINDEX, TROPONINI,  in the last 168 hours BNP (last 3 results) No results found for this basename: PROBNP,  in the last 8760 hours CBG: No results found for this basename: GLUCAP,  in the last 168 hours  No results found for this or any previous visit (from the past 240 hour(s)).   Studies: Ct Head Wo Contrast  08/27/2013   CLINICAL DATA:  Vision loss involving the right eye  EXAM: CT HEAD WITHOUT CONTRAST  TECHNIQUE: Contiguous axial images were obtained from the base of the skull through the vertex without intravenous contrast.  COMPARISON:  CT HEAD W/O CM dated 02/12/2012; CT HEAD W/O CM dated 09/19/2011  FINDINGS: Mild atrophy with sulcal prominence. Scattered periventricular hypodensities compatible of microvascular ischemic disease. Given background parenchymal abnormalities, there is no CT evidence of acute large territory infarct. No intraparenchymal or extra-axial mass or hemorrhage. Unchanged size and configuration of the ventricles and basilar cisterns. No midline shift. Intracranial atherosclerosis. Limited  visualization of the paranasal sinuses and mastoid air cells is normal. Regional soft tissues appears normal. No displaced calvarial fracture.  IMPRESSION: Mild atrophy and microvascular ischemic disease without acute intracranial process.   Electronically Signed   By: Sandi Mariscal M.D.   On: 08/27/2013 17:52   [name]  08/29/2013   CLINICAL DATA:  Hypertensive emergency. Right eye visual loss with suspected central retinal artery occlusion.  EXAM: MRI HEAD WITHOUT CONTRAST  MRA HEAD  WITHOUT CONTRAST  TECHNIQUE: Multiplanar, multiecho pulse sequences of the brain and surrounding structures were obtained without intravenous contrast. Angiographic images of the head were obtained using MRA technique without contrast.  COMPARISON:  CT HEAD W/O CM dated 08/27/2013; CT HEAD W/O CM dated 02/12/2012  FINDINGS: MRI HEAD FINDINGS  Patient was unable to complete the full exam. No FLAIR, gradient, or T1 weighted images are obtained as part of the MR brain study.  Subcentimeter focus of restricted diffusion affects the left anterior parietal subcortical white matter consistent with acute infarction. Lower level restricted diffusion with beginning normalization of ADC signal, right posterior frontal subcortical white matter, consistent with subacute infarction.  Within limits of detection on this nonstandard exam, no visible hemorrhage, mass lesion, or extra-axial fluid.  Moderate ventriculomegaly without corresponding cortical atrophy. Normal pressure hydrocephalus not excluded, versus central atrophy. Increased T2 bright signal in the periventricular and subcortical white matter likely chronic microvascular ischemic change.  MRA HEAD FINDINGS  Dolichoectatic and widely patent internal carotid arteries and basilar artery. Vertebrals codominant without distal disease. No proximal intracranial stenosis or aneurysm. Right anterior cerebral artery dominant. Mild irregularity of the distal MCA and PCA branches, likely intracranial atherosclerotic change.  IMPRESSION: Bilateral subcentimeter supratentorial infarcts, acute on the left affecting the left parietal subcortical white matter, and subacute on the right affecting the posterior frontal subcortical region.  No proximal intracranial stenosis or vascular occlusion.  Prematurely truncated exam reveals moderate ventriculomegaly ; normal-pressure hydrocephalus is a consideration versus central atrophy. Moderate chronic microvascular ischemic change affects the  periventricular and subcortical white matter.   Electronically Signed   By: Rolla Flatten M.D.   On: 08/29/2013 09:13   [name] Head/brain Wo Cm  08/29/2013   CLINICAL DATA:  Hypertensive emergency. Right eye visual loss with suspected central retinal artery occlusion.  EXAM: MRI HEAD WITHOUT CONTRAST  MRA HEAD WITHOUT CONTRAST  TECHNIQUE: Multiplanar, multiecho pulse sequences of the brain and surrounding structures were obtained without intravenous contrast. Angiographic images of the head were obtained using MRA technique without contrast.  COMPARISON:  CT HEAD W/O CM dated 08/27/2013; CT HEAD W/O CM dated 02/12/2012  FINDINGS: MRI HEAD FINDINGS  Patient was unable to complete the full exam. No FLAIR, gradient, or T1 weighted images are obtained as part of the MR brain study.  Subcentimeter focus of restricted diffusion affects the left anterior parietal subcortical white matter consistent with acute infarction. Lower level restricted diffusion with beginning normalization of ADC signal, right posterior frontal subcortical white matter, consistent with subacute infarction.  Within limits of detection on this nonstandard exam, no visible hemorrhage, mass lesion, or extra-axial fluid.  Moderate ventriculomegaly without corresponding cortical atrophy. Normal pressure hydrocephalus not excluded, versus central atrophy. Increased T2 bright signal in the periventricular and subcortical white matter likely chronic microvascular ischemic change.  MRA HEAD FINDINGS  Dolichoectatic and widely patent internal carotid arteries and basilar artery. Vertebrals codominant without distal disease. No proximal intracranial stenosis or aneurysm. Right anterior cerebral artery dominant. Mild irregularity  of the distal MCA and PCA branches, likely intracranial atherosclerotic change.  IMPRESSION: Bilateral subcentimeter supratentorial infarcts, acute on the left affecting the left parietal subcortical white matter, and subacute on the  right affecting the posterior frontal subcortical region.  No proximal intracranial stenosis or vascular occlusion.  Prematurely truncated exam reveals moderate ventriculomegaly ; normal-pressure hydrocephalus is a consideration versus central atrophy. Moderate chronic microvascular ischemic change affects the periventricular and subcortical white matter.   Electronically Signed   By: Rolla Flatten M.D.   On: 08/29/2013 09:13    Scheduled Meds: . aspirin  325 mg Oral Daily  . brimonidine  1 drop Right Eye BID  . hydrALAZINE  25 mg Oral TID  . methylphenidate  10 mg Oral q morning - 10a  . metoprolol tartrate  25 mg Oral BID  . nicotine  14 mg Transdermal Daily  . pantoprazole  40 mg Oral Daily  . sodium chloride  3 mL Intravenous Q12H  . tamsulosin  0.4 mg Oral QHS  . venlafaxine XR  37.5 mg Oral QHS   Continuous Infusions:   Principal Problem:   Central retinal artery occlusion of right eye Active Problems:   Hypertensive emergency   Acid reflux   Anxiety   Sleep apnea    Time spent: 92min    [place] [facility] Pager [phone]. If 7PM-7AM, please contact night-coverage at [email or web address], password [facility] 08/29/2013, 11:19 AM  LOS: 2 days

## 2013-08-29 NOTE — Progress Notes (Addendum)
PT Cancellation Note  Patient Details Name: Luke Allen MRN: [id number] DOB: [birth year]   Cancelled Treatment:    Reason Eval/Treat Not Completed: Fatigue/lethargy limiting ability to participate. Pt sleeping. Arousable and tells me he didn't sleep well at all last night because he didn't have his CPAP. Quickly back to sleep. Aroused again and stated he could not get up to participate with therapy/balance testing right now. Immediately drifting off to sleep again. Noted h/o narcolepsy. Will attempt eval later this date.   Luke Allen 08/29/2013, 10:09 AM Pager [phone]  Addendum (1115)- Pt again too lethargic and falling asleep mid-sentence. Pt was able to tell me when he is like this that he takes Ritalin. Spoke with RN and they are awaiting meds from Pharmacy.    08/29/2013 Luke Allen, PT Pager: [phone]

## 2013-08-29 NOTE — Progress Notes (Signed)
PT Cancellation Note  Patient Details Name: Jayten Gabbard MRN: [id number] DOB: 07/03/1935   Cancelled Treatment:    Reason Eval/Treat Not Completed: Patient at procedure or test/unavailable. Gone for MRI   Rhiann Boucher 08/29/2013, 8:05 AM Pager [phone]

## 2013-08-30 DIAGNOSIS — I10 Essential (primary) hypertension: Secondary | ICD-10-CM

## 2013-08-30 DIAGNOSIS — I6350 Cerebral infarction due to unspecified occlusion or stenosis of unspecified cerebral artery: Secondary | ICD-10-CM

## 2013-08-30 DIAGNOSIS — H3410 Central retinal artery occlusion, unspecified eye: Secondary | ICD-10-CM

## 2013-08-30 DIAGNOSIS — K219 Gastro-esophageal reflux disease without esophagitis: Secondary | ICD-10-CM

## 2013-08-30 MED ORDER — PSEUDOEPHEDRINE HCL 30 MG PO TABS
30.0000 mg | ORAL_TABLET | Freq: Four times a day (QID) | ORAL | Status: DC | PRN
  Filled 2013-08-30: qty 1

## 2013-08-30 MED ORDER — SODIUM CHLORIDE 0.9 % IV SOLN
INTRAVENOUS | Status: DC
  Administered 2013-08-31 (×2): via INTRAVENOUS

## 2013-08-30 MED ORDER — CHLORHEXIDINE GLUCONATE 4 % EX LIQD
60.0000 mL | Freq: Once | CUTANEOUS | Status: AC
  Administered 2013-08-31: 4 via TOPICAL
  Filled 2013-08-30: qty 60

## 2013-08-30 NOTE — Evaluation (Signed)
Speech Language Pathology Evaluation Patient Details Name: Luke Allen MRN: [id number] DOB: January 18, 1935 Today's Date: 08/30/2013 Time: 4696-2952 SLP Time Calculation (min): 35 min  Problem List:  Patient Active Problem List   Diagnosis Date Noted  . CVA (cerebral infarction) 08/29/2013  . 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 Acid reflux   . Anxiety   . Sleep apnea   . Hypertensive emergency 08/27/2013   Past Medical History:  Past Medical History  Diagnosis Date  . Acid reflux   . Anxiety   . Sleep apnea   . Narcolepsy   . Catalepsy   . Narcolepsy   . Sleep apnea   . Hypertension    Past Surgical History:  Past Surgical History  Procedure Laterality Date  . Arm surgery      HPI:  Pt adm due to loss of vision Rt eye. Diagnosed with Central Retinal Artery Occlusion and admitted due to SBP's >200. MRI Brain-Bilateral subcentimeter supratentorial infarcts, acute on the left. PMHx includes HTN, cataracts, and narcolepsy   Assessment / Plan / Recommendation Clinical Impression  Pt presents with moderate deficits with sustained attention and storage of new information, which pt reports is exacberated from his baseline. Pt was living alone PTA, and will benefit from acute SLP services and likely brief HH vs OP SLP f/u upon d/c in order to maximize functional independence.    SLP Assessment  Patient needs continued Speech Lanaguage Pathology Services    Follow Up Recommendations  Home health SLP;Outpatient SLP    Frequency and Duration min 2x/week  1 week   Pertinent Vitals/Pain N/A   SLP Goals  SLP Goals Potential to Achieve Goals: Fair Potential Considerations: Previous level of function  SLP Evaluation Prior Functioning  Cognitive/Linguistic Baseline: Baseline deficits Baseline deficit details: pt reports a long h/o decreased "short term memory" Type of Home: House  Lives With: Alone;Other (Comment) (receives help with cleaning from "home  health aid") Available Help at Discharge: Friend(s);Neighbor;Available PRN/intermittently   Cognition  Overall Cognitive Status: Impaired/Different from baseline Arousal/Alertness: Awake/alert Orientation Level: Oriented X4 Attention: Sustained Sustained Attention: Impaired Sustained Attention Impairment: Verbal basic Memory: Impaired Memory Impairment: Storage deficit;Decreased recall of new information;Retrieval deficit Awareness: Appears intact Problem Solving: Appears intact Safety/Judgment: Appears intact    Comprehension  Auditory Comprehension Overall Auditory Comprehension: Appears within functional limits for tasks assessed Visual Recognition/Discrimination Discrimination: Not tested Reading Comprehension Reading Status: Not tested    Expression Expression Primary Mode of Expression: Verbal Verbal Expression Overall Verbal Expression: Appears within functional limits for tasks assessed Written Expression Written Expression: Not tested   Oral / Motor Motor Speech Overall Motor Speech: Appears within functional limits for tasks assessed   GO      Germain Osgood, M.A. CCC-SLP [phone]  Germain Osgood 08/30/2013, 1:20 PM

## 2013-08-30 NOTE — Consult Note (Signed)
ELECTROPHYSIOLOGY CONSULT NOTE   Patient ID: Luke Allen MRN: [id number], DOB/AGE: 77-May-1936   Admit date: 08/27/2013 Date of Consult: 08/30/2013  Primary Physician: Arsenio Loader, MD [place], [place] Primary Cardiologist: New to [facility] Reason for Consultation: Cryptogenic stroke; recommendations regarding Implantable Loop Recorder  History of Present Illness Luke Allen is a 79 year old man with HTN, narcolepsy, OSA, abdominal aortic aneurysm s/p repair August 2014 and  tobacco abuse who was admitted on 08/27/2013 with acute CVA. Central retinal artery occlusion diagnosed. Further work has included MRI of the brain which revealed a small left parietal and right posterior frontal acute infarcts. Embolic etiology suspected. He has been monitored on telemetry with no evidence of atrial fibrillation. No cause has been identified. Inpatient stroke work-up is to be completed with a TEE. EP has been asked to evaluate for placement of an implantable loop recorder to monitor for atrial fibrillation.  Past Medical History Past Medical History  Diagnosis Date  . Acid reflux   . Anxiety   . Sleep apnea   . Narcolepsy   . Catalepsy   . Narcolepsy   . Sleep apnea   . Hypertension     Past Surgical History Past Surgical History  Procedure Laterality Date  . Arm surgery       Allergies/Intolerances No Known Allergies  Current Home Medications      albuterol 108 (90 BASE) MCG/ACT inhaler  Commonly known as:  PROVENTIL HFA;VENTOLIN HFA  Inhale 2 puffs into the lungs every 6 (six) hours as needed for wheezing or shortness of breath.     bisacodyl 5 MG EC tablet  Commonly known as:  DULCOLAX  Take 5 mg by mouth daily as needed for moderate constipation.     cetirizine 10 MG tablet  Commonly known as:  ZYRTEC  Take 10 mg by mouth daily as needed for allergies.     methylphenidate 10 MG tablet  Commonly known as:  RITALIN  Take 10 mg by mouth 2 (two) times daily as needed.      metoprolol tartrate 25 MG tablet  Commonly known as:  LOPRESSOR  Take 25 mg by mouth 2 (two) times daily.     nitroGLYCERIN 0.4 MG SL tablet  Commonly known as:  NITROSTAT  Place 0.4 mg under the tongue every 5 (five) minutes as needed for chest pain.     tamsulosin 0.4 MG Caps capsule  Commonly known as:  FLOMAX  Take 0.4 mg by mouth at bedtime.     venlafaxine XR 150 MG 24 hr capsule  Commonly known as:  EFFEXOR-XR  Take 150 mg by mouth 2 (two) times daily.     venlafaxine XR 37.5 MG 24 hr capsule  Commonly known as:  EFFEXOR-XR  Take 37.5 mg by mouth every evening.     Inpatient Medications . amLODipine  5 mg Oral Daily  . aspirin  325 mg Oral Daily  . atorvastatin  20 mg Oral q1800  . brimonidine  1 drop Right Eye BID  . hydrALAZINE  25 mg Oral BID  . methylphenidate  10 mg Oral q morning - 10a  . metoprolol tartrate  25 mg Oral BID  . nicotine  14 mg Transdermal Daily  . pantoprazole  40 mg Oral Daily  . sodium chloride  3 mL Intravenous Q12H  . tamsulosin  0.4 mg Oral QHS  . venlafaxine XR  150 mg Oral 2 times per day  . venlafaxine XR  37.5 mg Oral QHS  Social History History   Social History  . Marital Status: Single    Spouse Name: N/A    Number of Children: N/A  . Years of Education: N/A   Occupational History  . Not on file.   Social History Main Topics  . Smoking status: Never Smoker   . Smokeless tobacco: Not on file  . Alcohol Use: No  . Drug Use: No  . Sexual Activity:    Other Topics Concern  . Not on file   Social History Narrative  . No narrative on file    Review of Systems General: No chills, fever, night sweats or weight changes  Cardiovascular:  No chest pain, dyspnea on exertion, edema, orthopnea, palpitations, paroxysmal nocturnal dyspnea Dermatological: No rash, lesions or masses Respiratory: No cough, dyspnea Urologic: No hematuria, dysuria Abdominal: No nausea, vomiting, diarrhea, bright red blood per rectum, melena, or  hematemesis Neurologic: No visual changes, weakness, changes in mental status All other systems reviewed and are otherwise negative except as noted above.  Physical Exam Blood pressure 151/78, pulse 74, temperature 98.2 F (36.8 C), temperature source Oral, resp. rate 18, weight 240 lb 4.8 oz (108.999 kg), SpO2 91.00%.  General: Well developed, well appearing 79 y.o. male in no acute distress. HEENT: Normocephalic, atraumatic. EOMs intact. Sclera nonicteric. Oropharynx clear.  Neck: Supple. No JVD. Lungs: Respirations regular and unlabored, CTA bilaterally. No wheezes, rales or rhonchi. Heart: RRR. S1, S2 present. No murmurs, rub, S3 or S4. Abdomen: Soft, non-distended.  Extremities: No clubbing, cyanosis or edema. PT/Radials 2+ and equal bilaterally. Psych: Normal affect. Neuro: Alert and oriented X 3. Moves all extremities spontaneously. Musculoskeletal: No kyphosis. Skin: Intact. Warm and dry. No rashes or petechiae in exposed areas.   Labs Lab Results  Component Value Date   WBC 5.2 08/28/2013   HGB 13.2 08/28/2013   HCT 38.1* 08/28/2013   MCV 85.4 08/28/2013   PLT 218 08/28/2013    Recent Labs Lab 08/27/13 1723 08/28/13 0600  NA 144 144  K 3.9 4.1  CL 106 108  CO2 26 25  BUN 12 11  CREATININE 0.86 0.90  CALCIUM 9.0 9.0  PROT 6.4  --   BILITOT 0.3  --   ALKPHOS 107  --   ALT 20  --   AST 17  --   GLUCOSE 106* 103*    Recent Labs  08/27/13 1723  INR 1.06    Radiology/Studies Ct Head Wo Contrast  08/27/2013   CLINICAL DATA:  Vision loss involving the right eye  EXAM: CT HEAD WITHOUT CONTRAST  TECHNIQUE: Contiguous axial images were obtained from the base of the skull through the vertex without intravenous contrast.  COMPARISON:  CT HEAD W/O CM dated 02/12/2012; CT HEAD W/O CM dated 09/19/2011  FINDINGS: Mild atrophy with sulcal prominence. Scattered periventricular hypodensities compatible of microvascular ischemic disease. Given background parenchymal abnormalities,  there is no CT evidence of acute large territory infarct. No intraparenchymal or extra-axial mass or hemorrhage. Unchanged size and configuration of the ventricles and basilar cisterns. No midline shift. Intracranial atherosclerosis. Limited visualization of the paranasal sinuses and mastoid air cells is normal. Regional soft tissues appears normal. No displaced calvarial fracture.  IMPRESSION: Mild atrophy and microvascular ischemic disease without acute intracranial process.   Electronically Signed   By: Sandi Mariscal M.D.   On: 08/27/2013 17:52   [name]  08/29/2013   CLINICAL DATA:  Hypertensive emergency. Right eye visual loss with suspected central retinal artery occlusion.  EXAM: MRI HEAD WITHOUT CONTRAST  MRA HEAD WITHOUT CONTRAST  TECHNIQUE: Multiplanar, multiecho pulse sequences of the brain and surrounding structures were obtained without intravenous contrast. Angiographic images of the head were obtained using MRA technique without contrast.  COMPARISON:  CT HEAD W/O CM dated 08/27/2013; CT HEAD W/O CM dated 02/12/2012  FINDINGS: MRI HEAD FINDINGS  Patient was unable to complete the full exam. No FLAIR, gradient, or T1 weighted images are obtained as part of the MR brain study.  Subcentimeter focus of restricted diffusion affects the left anterior parietal subcortical white matter consistent with acute infarction. Lower level restricted diffusion with beginning normalization of ADC signal, right posterior frontal subcortical white matter, consistent with subacute infarction.  Within limits of detection on this nonstandard exam, no visible hemorrhage, mass lesion, or extra-axial fluid.  Moderate ventriculomegaly without corresponding cortical atrophy. Normal pressure hydrocephalus not excluded, versus central atrophy. Increased T2 bright signal in the periventricular and subcortical white matter likely chronic microvascular ischemic change.  MRA HEAD FINDINGS  Dolichoectatic and widely patent  internal carotid arteries and basilar artery. Vertebrals codominant without distal disease. No proximal intracranial stenosis or aneurysm. Right anterior cerebral artery dominant. Mild irregularity of the distal MCA and PCA branches, likely intracranial atherosclerotic change.  IMPRESSION: Bilateral subcentimeter supratentorial infarcts, acute on the left affecting the left parietal subcortical white matter, and subacute on the right affecting the posterior frontal subcortical region.  No proximal intracranial stenosis or vascular occlusion.  Prematurely truncated exam reveals moderate ventriculomegaly ; normal-pressure hydrocephalus is a consideration versus central atrophy. Moderate chronic microvascular ischemic change affects the periventricular and subcortical white matter.   Electronically Signed   By: Rolla Flatten M.D.   On: 08/29/2013 09:13   Echocardiogram  Study Conclusions Left ventricle: The cavity size was normal. Wall thickness was increased in a pattern of mild LVH. Systolic function was normal. The estimated ejection fraction was in the range of 60% to 65%. Wall motion was normal; there were no regional wall motion abnormalities. There was an increased relative contribution of atrial contraction to ventricular filling.   12-lead ECG none in EPIC Telemetry SR  Assessment and Plan 1. Cryptogenic stroke 2. HTN 3. OSA 4. Narcolepsy 5. AAA s/p repair August 2014 at [facility]  If the TEE is negative, we recommend loop recorder insertion to monitor for AF. The indication for loop recorder insertion / monitoring for AF in setting of cryptogenic stroke was discussed with the patient. The loop recorder insertion procedure was reviewed in detail including risks and benefits. These risks include but are not limited to bleeding and infection. The patient expressed verbal understanding and agrees to proceed. The patient was also counseled regarding wound care and device follow-up. As requested by  Mr. Quinney, I spoke with his sister, Patsy Lager, via phone who expressed verbal understanding and agrees with plan.  SignedIleene Hutchinson 08/30/2013, 2:17 PM  EP Attending  Patient seen and examined. Agree with the above history, physical exam, assessment and plan. He has unexplained stroke. Atrial fibrillation may well be the cause but we have not seen this on telemetry. If TEE which is scheduled tomorrow demonstrates no etiology to explain the stroke, then would recommend insertion of an ILR to monitor atrial fibrillation.  Mikle Bosworth.D.

## 2013-08-30 NOTE — Clinical Documentation Improvement (Signed)
Possible Clinical Conditions?   Accelerated Hypertension Malignant Hypertension Other Condition Cannot Clinically Determine    Risk Factors: Hypertensive emergency improved with IV Labetolol/Hydralazine, start Norvasc 5mg , noted per 02/16 progress notes.  SBP range (02/14):  186-205 DBP range (02/14):  98-111  Thank You, Theron Arista, Clinical Documentation Specialist:  [facility] [employer] [facility]

## 2013-08-30 NOTE — Progress Notes (Signed)
    [facility] has been requested to perform a transesophageal echocardiogram on Ammiel Balbach for CVA.  After careful review of history and examination, the risks and benefits of transesophageal echocardiogram have been explained including risks of esophageal damage, perforation (1:10,000 risk), bleeding, pharyngeal hematoma as well as other potential complications associated with conscious sedation including aspiration, arrhythmia, respiratory failure and death. Alternatives to treatment were discussed, questions were answered.  Patient is willing to proceed.   Can Lucci, PA-C 08/30/2013 1:28 PM   

## 2013-08-30 NOTE — Progress Notes (Addendum)
[facility] PROGRESS NOTE  Luke Allen TIW:[id number] DOB: 1934/08/16 DOA: 08/27/2013 PCP: Pcp Not In System  Assessment/Plan: 1. CRAO -BP control -seen by ophthalmology Dr.Spencer in ER appreciate input, needs FU -continue Alphagan eye Drops  -MRI yesterday am positive for b/l subcentimeter infarcts  2. Bilateral CVA -likely embolic, noted on MRI -appreciate Neuro eval -continue ASA -ECHO completed and unremarkable -carotid duplex 1-39% stenosis -LDL 108, started low dose statin -hbaic 5.6 -Neuro recommended TEE, d/w cards will be done tomorrow, unable to fit on todays schedule  3. Hypertensive emergency -improved with IV labetalol/hydralazine on admission -continue hydralazine, change to BID for complaince -started norvasc 5mg  daily yesterday   4. H/o CAD/PCI -home meds yet to be reconciled -continue ASA,resumed BB and other meds once reconciled -pt reports getting meds from [place] [facility] and [place] [facility] [place], we have requested med list from both [facility]  5. Anxiety -continue xanax PRN  6. Tobacco use -counseled  7. OSA -CPAP Qhs  DVT proph: SCDs  Code Status: Full Code Family Communication: no family at bedside Disposition Plan: home tomorrow after workup completed   Consultants:  Ophthalmology  HPI/Subjective: Still no new complaints, still with Visual loss in R eye  Objective: Filed Vitals:   08/30/13 0423  BP: 133/82  Pulse: 81  Temp: 98.2 F (36.8 C)  Resp: 18    Intake/Output Summary (Last 24 hours) at 08/30/13 0855 Last data filed at 08/30/13 0540  Gross per 24 hour  Intake    360 ml  Output   2025 ml  Net  -1665 ml   Filed Weights   08/28/13 0107  Weight: 108.999 kg (240 lb 4.8 oz)    Exam:   General:  AAOx3, obese no distress  HEENT: PERRLA  Cardiovascular: S1S2/RRR  Respiratory: CTAB  Abdomen: soft, NT, BS present  Musculoskeletal: no edema c/c   Data Reviewed: Basic Metabolic Panel:  Recent Labs Lab  08/27/13 1723 08/28/13 0600  NA 144 144  K 3.9 4.1  CL 106 108  CO2 26 25  GLUCOSE 106* 103*  BUN 12 11  CREATININE 0.86 0.90  CALCIUM 9.0 9.0   Liver Function Tests:  Recent Labs Lab 08/27/13 1723  AST 17  ALT 20  ALKPHOS 107  BILITOT 0.3  PROT 6.4  ALBUMIN 3.7   No results found for this basename: LIPASE, AMYLASE,  in the last 168 hours No results found for this basename: AMMONIA,  in the last 168 hours CBC:  Recent Labs Lab 08/27/13 1723 08/28/13 0600  WBC 6.1 5.2  NEUTROABS 4.0  --   HGB 13.5 13.2  HCT 38.8* 38.1*  MCV 85.7 85.4  PLT 217 218   Cardiac Enzymes: No results found for this basename: CKTOTAL, CKMB, CKMBINDEX, TROPONINI,  in the last 168 hours BNP (last 3 results) No results found for this basename: PROBNP,  in the last 8760 hours CBG: No results found for this basename: GLUCAP,  in the last 168 hours  No results found for this or any previous visit (from the past 240 hour(s)).   Studies: [name]  08/29/2013   CLINICAL DATA:  Hypertensive emergency. Right eye visual loss with suspected central retinal artery occlusion.  EXAM: MRI HEAD WITHOUT CONTRAST  MRA HEAD WITHOUT CONTRAST  TECHNIQUE: Multiplanar, multiecho pulse sequences of the brain and surrounding structures were obtained without intravenous contrast. Angiographic images of the head were obtained using MRA technique without contrast.  COMPARISON:  CT HEAD W/O CM dated  08/27/2013; CT HEAD W/O CM dated 02/12/2012  FINDINGS: MRI HEAD FINDINGS  Patient was unable to complete the full exam. No FLAIR, gradient, or T1 weighted images are obtained as part of the MR brain study.  Subcentimeter focus of restricted diffusion affects the left anterior parietal subcortical white matter consistent with acute infarction. Lower level restricted diffusion with beginning normalization of ADC signal, right posterior frontal subcortical white matter, consistent with subacute infarction.  Within limits  of detection on this nonstandard exam, no visible hemorrhage, mass lesion, or extra-axial fluid.  Moderate ventriculomegaly without corresponding cortical atrophy. Normal pressure hydrocephalus not excluded, versus central atrophy. Increased T2 bright signal in the periventricular and subcortical white matter likely chronic microvascular ischemic change.  MRA HEAD FINDINGS  Dolichoectatic and widely patent internal carotid arteries and basilar artery. Vertebrals codominant without distal disease. No proximal intracranial stenosis or aneurysm. Right anterior cerebral artery dominant. Mild irregularity of the distal MCA and PCA branches, likely intracranial atherosclerotic change.  IMPRESSION: Bilateral subcentimeter supratentorial infarcts, acute on the left affecting the left parietal subcortical white matter, and subacute on the right affecting the posterior frontal subcortical region.  No proximal intracranial stenosis or vascular occlusion.  Prematurely truncated exam reveals moderate ventriculomegaly ; normal-pressure hydrocephalus is a consideration versus central atrophy. Moderate chronic microvascular ischemic change affects the periventricular and subcortical white matter.   Electronically Signed   By: Rolla Flatten M.D.   On: 08/29/2013 09:13   [name] Head/brain Wo Cm  08/29/2013   CLINICAL DATA:  Hypertensive emergency. Right eye visual loss with suspected central retinal artery occlusion.  EXAM: MRI HEAD WITHOUT CONTRAST  MRA HEAD WITHOUT CONTRAST  TECHNIQUE: Multiplanar, multiecho pulse sequences of the brain and surrounding structures were obtained without intravenous contrast. Angiographic images of the head were obtained using MRA technique without contrast.  COMPARISON:  CT HEAD W/O CM dated 08/27/2013; CT HEAD W/O CM dated 02/12/2012  FINDINGS: MRI HEAD FINDINGS  Patient was unable to complete the full exam. No FLAIR, gradient, or T1 weighted images are obtained as part of the MR brain study.   Subcentimeter focus of restricted diffusion affects the left anterior parietal subcortical white matter consistent with acute infarction. Lower level restricted diffusion with beginning normalization of ADC signal, right posterior frontal subcortical white matter, consistent with subacute infarction.  Within limits of detection on this nonstandard exam, no visible hemorrhage, mass lesion, or extra-axial fluid.  Moderate ventriculomegaly without corresponding cortical atrophy. Normal pressure hydrocephalus not excluded, versus central atrophy. Increased T2 bright signal in the periventricular and subcortical white matter likely chronic microvascular ischemic change.  MRA HEAD FINDINGS  Dolichoectatic and widely patent internal carotid arteries and basilar artery. Vertebrals codominant without distal disease. No proximal intracranial stenosis or aneurysm. Right anterior cerebral artery dominant. Mild irregularity of the distal MCA and PCA branches, likely intracranial atherosclerotic change.  IMPRESSION: Bilateral subcentimeter supratentorial infarcts, acute on the left affecting the left parietal subcortical white matter, and subacute on the right affecting the posterior frontal subcortical region.  No proximal intracranial stenosis or vascular occlusion.  Prematurely truncated exam reveals moderate ventriculomegaly ; normal-pressure hydrocephalus is a consideration versus central atrophy. Moderate chronic microvascular ischemic change affects the periventricular and subcortical white matter.   Electronically Signed   By: Rolla Flatten M.D.   On: 08/29/2013 09:13    Scheduled Meds: . amLODipine  5 mg Oral Daily  . aspirin  325 mg Oral Daily  . atorvastatin  20 mg Oral q1800  .  brimonidine  1 drop Right Eye BID  . hydrALAZINE  25 mg Oral BID  . methylphenidate  10 mg Oral q morning - 10a  . metoprolol tartrate  25 mg Oral BID  . nicotine  14 mg Transdermal Daily  . pantoprazole  40 mg Oral Daily  . sodium  chloride  3 mL Intravenous Q12H  . tamsulosin  0.4 mg Oral QHS  . venlafaxine XR  150 mg Oral 2 times per day  . venlafaxine XR  37.5 mg Oral QHS   Continuous Infusions:   Principal Problem:   Central retinal artery occlusion of right eye Active Problems:   Hypertensive emergency   Acid reflux   Anxiety   Sleep apnea   CVA (cerebral infarction)    Time spent: 4min    [facility] Pager [phone]. If 7PM-7AM, please contact night-coverage at [email or web address], password [facility] 08/30/2013, 8:55 AM  LOS: 3 days

## 2013-08-30 NOTE — Progress Notes (Signed)
Stroke Team Progress Note  HISTORY Luke Allen is an 79 y.o. male who presents to the ED on 08/27/2013 with complaints of vision los in his right eye which occurred at 6:30 pm. He denied having any headaches or chest pain, and also denied having any Decreased LOC, or syncope. He was evaluated ain the ED and Ophthalmology was consulted and saw him as well in the ED and prescribed Alphagan Ophthalmic drops BID, and he was diagnosed with Central Retinal Artery Occlusion and cataracts were seen in both eyes. His blood pressures were found to be 532'D systolic and he was administered IV Labetalol x 1 with improvement in his blood pressure. He was referred for medical admission. Work has included a carotid doppler that shows no significant stenosis and an echocardiogram that was unremarkable.   SUBJECTIVE   OBJECTIVE Most recent Vital Signs: Filed Vitals:   08/29/13 2127 08/29/13 2347 08/30/13 0423 08/30/13 1038  BP: 135/91  133/82 151/78  Pulse: 76 77 81 74  Temp: 97.9 F (36.6 C)  98.2 F (36.8 C)   TempSrc: Oral  Oral   Resp: 17 19 18    Weight:      SpO2: 93%  91%    CBG (last 3)  No results found for this basename: GLUCAP,  in the last 72 hours  IV Fluid Intake:     MEDICATIONS  . amLODipine  5 mg Oral Daily  . aspirin  325 mg Oral Daily  . atorvastatin  20 mg Oral q1800  . brimonidine  1 drop Right Eye BID  . hydrALAZINE  25 mg Oral BID  . methylphenidate  10 mg Oral q morning - 10a  . metoprolol tartrate  25 mg Oral BID  . nicotine  14 mg Transdermal Daily  . pantoprazole  40 mg Oral Daily  . sodium chloride  3 mL Intravenous Q12H  . tamsulosin  0.4 mg Oral QHS  . venlafaxine XR  150 mg Oral 2 times per day  . venlafaxine XR  37.5 mg Oral QHS   PRN:  sodium chloride, acetaminophen, acetaminophen, ALPRAZolam, alum & mag hydroxide-simeth, hydrALAZINE, HYDROmorphone (DILAUDID) injection, nitroGLYCERIN, ondansetron (ZOFRAN) IV, ondansetron, oxyCODONE, senna-docusate, sodium  chloride  Diet:  Cardiac thin liquids Activity:  Up with assistance DVT Prophylaxis:  SCDs   CLINICALLY SIGNIFICANT STUDIES Basic Metabolic Panel:  Recent Labs Lab 08/27/13 1723 08/28/13 0600  NA 144 144  K 3.9 4.1  CL 106 108  CO2 26 25  GLUCOSE 106* 103*  BUN 12 11  CREATININE 0.86 0.90  CALCIUM 9.0 9.0   Liver Function Tests:  Recent Labs Lab 08/27/13 1723  AST 17  ALT 20  ALKPHOS 107  BILITOT 0.3  PROT 6.4  ALBUMIN 3.7   CBC:  Recent Labs Lab 08/27/13 1723 08/28/13 0600  WBC 6.1 5.2  NEUTROABS 4.0  --   HGB 13.5 13.2  HCT 38.8* 38.1*  MCV 85.7 85.4  PLT 217 218   Coagulation:  Recent Labs Lab 08/27/13 1723  LABPROT 13.6  INR 1.06   Cardiac Enzymes: No results found for this basename: CKTOTAL, CKMB, CKMBINDEX, TROPONINI,  in the last 168 hours Urinalysis: No results found for this basename: COLORURINE, APPERANCEUR, LABSPEC, PHURINE, GLUCOSEU, HGBUR, BILIRUBINUR, KETONESUR, PROTEINUR, UROBILINOGEN, NITRITE, LEUKOCYTESUR,  in the last 168 hours Lipid Panel    Component Value Date/Time   CHOL 182 08/29/2013 0525   TRIG 175* 08/29/2013 0525   HDL 39* 08/29/2013 0525   CHOLHDL 4.7 08/29/2013 0525  VLDL 35 08/29/2013 0525   LDLCALC 108* 08/29/2013 0525   HgbA1C  Lab Results  Component Value Date   HGBA1C 5.6 08/29/2013    Urine Drug Screen:     Component Value Date/Time   LABOPIA NONE DETECTED 10/07/2011 2330   COCAINSCRNUR NONE DETECTED 10/07/2011 2330   LABBENZ NONE DETECTED 10/07/2011 2330   AMPHETMU NONE DETECTED 10/07/2011 2330   THCU NONE DETECTED 10/07/2011 2330   LABBARB NONE DETECTED 10/07/2011 2330    Alcohol Level: No results found for this basename: ETH,  in the last 168 hours   CT of the brain  08/27/2013 Mild atrophy and microvascular ischemic disease without acute intracranial process.  MRI of the brain  08/29/2013    Bilateral subcentimeter supratentorial infarcts, acute on the left affecting the left parietal subcortical white matter,  and subacute on the right affecting the posterior frontal subcortical region.  Prematurely truncated exam reveals moderate ventriculomegaly ; normal-pressure hydrocephalus is a consideration versus central atrophy. Moderate chronic microvascular ischemic change affects the periventricular and subcortical white matter.     MRA of the brain  08/29/2013   No proximal intracranial stenosis or vascular occlusion.    2D Echocardiogram  EF 60-65% with no source of embolus.   Carotid Doppler  No evidence of hemodynamically significant internal carotid artery stenosis. Vertebral artery flow is antegrade.   TEE  Therapy Recommendations no PT f/u, OT recommends possible ALF  Physical Exam   Awake alert. Afebrile. Head is nontraumatic. Neck is supple without bruit. Hearing is normal. Cardiac exam no murmur or gallop. Lungs are clear to auscultation. Distal pulses are well felt. Neurological Exam ;  Awake  Alert oriented x 3. Normal speech and language.eye movements full without nystagmus.fundi were not visualized. Vision acuity severely diminished in right eye with finger counting at 2 feet only and  Acuity andfields appear normal in left eye.Fundi not visualized. Hearing is normal. Palatal movements are normal. Face symmetric. Tongue midline. Normal strength, tone, reflexes and coordination. Normal sensation. Gait deferred. ASSESSMENT [name] is a 79 y.o. male presenting with loss of vision in his right eye. Diagnosed with right CRAO. Further imaging confirms a small left parietal and right posterior frontal infarcts. Infarcts felt to be embolic secondary to unknown etiology.  On no antithrombotics prior to admission. Now on aspirin 325 mg orally every day for secondary stroke prevention. Patient with resultant vision loss. Work up underway.  Hypertension Hyperlipidemia, LDL 108, on no statin PTA, now on lipitor 20 mg daily, goal LDL < 100 obstructive sleep apnea, uses CPAP narcolepsy  Hospital  day # 3  TREATMENT/PLAN  Continue aspirin 325 mg orally every day for secondary stroke prevention. TEE to look for embolic source. Arranged with [place] for tomorrow.  If positive for PFO (patent foramen ovale), check bilateral lower extremity venous dopplers to rule out DVT as possible source of stroke.  If TEE negative, a [place] electrophysiologist will consult and consider placement of an place implantable loop recorder to evaluate for atrial fibrillation as etiology of stroke. This has been explained to patient/family by Dr. Leonie Man and they are agreeable.  Burnetta Sabin, MSN, RN, ANVP-BC, ANP-BC, [facility] Pager: [phone] 08/30/2013 11:11 AM  I have personally obtained a history, examined the patient, evaluated imaging results, and formulated the assessment and plan of care. I agree with the above. Antony Contras, MD

## 2013-08-30 NOTE — Progress Notes (Signed)
EP team made aware of plan for TEE/potential loop - TEE scheduled for tomorrow at [place] [facility]

## 2013-08-31 ENCOUNTER — Encounter (HOSPITAL_COMMUNITY)
Admission: EM | Disposition: A | Discharge status: Home Health | Payer: Self-pay | Source: Home / Self Care | Attending: Internal Medicine

## 2013-08-31 ENCOUNTER — Encounter (HOSPITAL_COMMUNITY): Payer: Self-pay | Admitting: *Deleted

## 2013-08-31 DIAGNOSIS — I6350 Cerebral infarction due to unspecified occlusion or stenosis of unspecified cerebral artery: Secondary | ICD-10-CM

## 2013-08-31 DIAGNOSIS — I6789 Other cerebrovascular disease: Secondary | ICD-10-CM

## 2013-08-31 HISTORY — PX: LOOP RECORDER IMPLANT: SHX5954

## 2013-08-31 HISTORY — PX: TEE WITHOUT CARDIOVERSION: SHX5443

## 2013-08-31 LAB — CBC
HCT: 43 % (ref 39.0–52.0)
Hemoglobin: 15.1 g/dL (ref 13.0–17.0)
MCH: 30.1 pg (ref 26.0–34.0)
MCHC: 35.1 g/dL (ref 30.0–36.0)
MCV: 85.7 fL (ref 78.0–100.0)
Platelets: 214 10*3/uL (ref 150–400)
RBC: 5.02 MIL/uL (ref 4.22–5.81)
RDW: 14.1 % (ref 11.5–15.5)
WBC: 6.2 10*3/uL (ref 4.0–10.5)

## 2013-08-31 LAB — CREATININE, SERUM
CREATININE: 0.87 mg/dL (ref 0.50–1.35)
GFR calc non Af Amer: 82 mL/min — ABNORMAL LOW (ref >90)

## 2013-08-31 SURGERY — ECHOCARDIOGRAM, TRANSESOPHAGEAL
Anesthesia: Moderate Sedation

## 2013-08-31 SURGERY — LOOP RECORDER IMPLANT
Anesthesia: LOCAL

## 2013-08-31 MED ORDER — BUTAMBEN-TETRACAINE-BENZOCAINE 2-2-14 % EX AERO
INHALATION_SPRAY | CUTANEOUS | Status: DC | PRN
  Administered 2013-08-31: 2 via TOPICAL

## 2013-08-31 MED ORDER — MIDAZOLAM HCL 10 MG/2ML IJ SOLN
INTRAMUSCULAR | Status: DC | PRN
  Administered 2013-08-31: 2 mg via INTRAVENOUS

## 2013-08-31 MED ORDER — LIDOCAINE-EPINEPHRINE 1 %-1:100000 IJ SOLN
INTRAMUSCULAR | Status: AC
  Filled 2013-08-31: qty 1

## 2013-08-31 MED ORDER — FENTANYL CITRATE 0.05 MG/ML IJ SOLN
INTRAMUSCULAR | Status: AC
  Filled 2013-08-31: qty 2

## 2013-08-31 MED ORDER — ACETAMINOPHEN 325 MG PO TABS
325.0000 mg | ORAL_TABLET | ORAL | Status: DC | PRN
  Administered 2013-08-31: 650 mg via ORAL

## 2013-08-31 MED ORDER — ONDANSETRON HCL 4 MG/2ML IJ SOLN
4.0000 mg | Freq: Four times a day (QID) | INTRAMUSCULAR | Status: DC | PRN
Start: 2013-08-31 — End: 2013-09-01

## 2013-08-31 MED ORDER — MIDAZOLAM HCL 5 MG/ML IJ SOLN
INTRAMUSCULAR | Status: AC
  Filled 2013-08-31: qty 2

## 2013-08-31 MED ORDER — FENTANYL CITRATE 0.05 MG/ML IJ SOLN
INTRAMUSCULAR | Status: DC | PRN
  Administered 2013-08-31 (×2): 25 ug via INTRAVENOUS

## 2013-08-31 MED ORDER — HEPARIN SODIUM (PORCINE) 5000 UNIT/ML IJ SOLN
5000.0000 [IU] | Freq: Three times a day (TID) | INTRAMUSCULAR | Status: DC
  Administered 2013-08-31 – 2013-09-01 (×3): 5000 [IU] via SUBCUTANEOUS
  Filled 2013-08-31 (×6): qty 1

## 2013-08-31 NOTE — Progress Notes (Signed)
Spoke with patient about CPAP and he states he does not need any help with it. I instructed him to call if he needed anything.

## 2013-08-31 NOTE — CV Procedure (Signed)
    Transesophageal Echocardiogram Note  Luke Allen [id number] 01-22-1935  Procedure: Transesophageal Echocardiogram Indications: CVA  Procedure Details Consent: Obtained Time Out: Verified patient identification, verified procedure, site/side was marked, verified correct patient position, special equipment/implants available, Radiology Safety Procedures followed,  medications/allergies/relevent history reviewed, required imaging and test results available.  Performed  Medications: Fentanyl: 50 mcg iv Versed: 2 mg iv  Left Ventrical:  Normal LV   Mitral Valve: normal MV. Trace - mild MR  Aortic Valve: normal AV  Tricuspid Valve: normal TV ( whiff of TR)   Pulmonic Valve: normal  Left Atrium/ Left atrial appendage: no thrombi  Atrial septum: intact, no PFO with color or bubble study  Aorta: mild plaque   Complications: No apparent complications Patient did tolerate procedure well.   Thayer Headings, Brooke Bonito., MD, [facility] 08/31/2013, 11:21 AM

## 2013-08-31 NOTE — Progress Notes (Signed)
[facility] PROGRESS NOTE  Luke Allen YJE:[id number] DOB: [birth year] DOA: 08/27/2013 PCP: Pcp Not In System  Assessment/Plan:  1. CRAO -BP control  -seen by ophthalmology Dr.Spencer in ER appreciate input, needs FU  -continue Alphagan eye Drops  -MRI yesterday am positive for b/l subcentimeter infarcts   2. Bilateral CVA  -likely embolic, noted on MRI   -appreciate Neuro eval  -continue ASA  -ECHO completed and unremarkable  -carotid duplex 1-39% stenosis  -LDL 108, started low dose statin  -hbaic 5.6  -Will f/u with neurologist recommendations.  3. Hypertensive emergency  - improved with IV labetalol/hydralazine on admission  - continue hydralazine, change to BID for complaince  - started norvasc 5 mg daily yesterday   4. H/o CAD/PCI  -home meds yet to be reconciled  -continue ASA,resumed BB and other meds once reconciled  -pt reports getting meds from [place] [facility] [place] and [place] [facility] [place], we have requested med list from both [facility]   5. Anxiety  -continue xanax PRN   6. Tobacco use  -counseled   7. OSA -CPAP Qhs   DVT proph: SCDs   Code Status: Full Family Communication: None at bedside Disposition Plan: Pending results of current work up   Consultants:  Neurology  Cardiology  Procedures:  TEE  Antibiotics:  None  HPI/Subjective: No new complaints. No acute issues overnight  Objective: Filed Vitals:   08/31/13 1200  BP: 168/86  Pulse: 73  Temp:   Resp: 16    Intake/Output Summary (Last 24 hours) at 08/31/13 1639 Last data filed at 08/31/13 0700  Gross per 24 hour  Intake    360 ml  Output    625 ml  Net   -265 ml   Filed Weights   08/28/13 0107  Weight: 108.999 kg (240 lb 4.8 oz)    Exam:   General: Patient in no acute distress, alert and awake  Cardiovascular: Normal S1 and S2, no rubs  Respiratory: No increased work of breathing, breathing comfortably on room air, no audible wheezes  Abdomen: Soft, nondistended,  nontender  Musculoskeletal: No cyanosis or clubbing  Data Reviewed: Basic Metabolic Panel:  Recent Labs Lab 08/27/13 1723 08/28/13 0600 08/31/13 1315  NA 144 144  --   K 3.9 4.1  --   CL 106 108  --   CO2 26 25  --   GLUCOSE 106* 103*  --   BUN 12 11  --   CREATININE 0.86 0.90 0.87  CALCIUM 9.0 9.0  --    Liver Function Tests:  Recent Labs Lab 08/27/13 1723  AST 17  ALT 20  ALKPHOS 107  BILITOT 0.3  PROT 6.4  ALBUMIN 3.7   No results found for this basename: LIPASE, AMYLASE,  in the last 168 hours No results found for this basename: AMMONIA,  in the last 168 hours CBC:  Recent Labs Lab 08/27/13 1723 08/28/13 0600 08/31/13 1315  WBC 6.1 5.2 6.2  NEUTROABS 4.0  --   --   HGB 13.5 13.2 15.1  HCT 38.8* 38.1* 43.0  MCV 85.7 85.4 85.7  PLT 217 218 214   Cardiac Enzymes: No results found for this basename: CKTOTAL, CKMB, CKMBINDEX, TROPONINI,  in the last 168 hours BNP (last 3 results) No results found for this basename: PROBNP,  in the last 8760 hours CBG: No results found for this basename: GLUCAP,  in the last 168 hours  No results found for this or any previous visit (from the past 240 hour(s)).  Studies: No results found.  Scheduled Meds: . amLODipine  5 mg Oral Daily  . aspirin  325 mg Oral Daily  . atorvastatin  20 mg Oral q1800  . brimonidine  1 drop Right Eye BID  . heparin subcutaneous  5,000 Units Subcutaneous 3 times per day  . hydrALAZINE  25 mg Oral BID  . methylphenidate  10 mg Oral q morning - 10a  . metoprolol tartrate  25 mg Oral BID  . nicotine  14 mg Transdermal Daily  . pantoprazole  40 mg Oral Daily  . sodium chloride  3 mL Intravenous Q12H  . tamsulosin  0.4 mg Oral QHS  . venlafaxine XR  150 mg Oral 2 times per day  . venlafaxine XR  37.5 mg Oral QHS   Continuous Infusions:   Principal Problem:   Central retinal artery occlusion of right eye Active Problems:   Hypertensive emergency   Acid reflux   Anxiety   Sleep  apnea   CVA (cerebral infarction)   Time spent: > 35 minutes   Velvet Bathe  [facility] Pager [phone]  If 7PM-7AM, please contact night-coverage at [email or web address], password [facility] 08/31/2013, 4:39 PM  LOS: 4 days

## 2013-08-31 NOTE — CV Procedure (Signed)
EP Procedure Note  Procedure: Insertion of an implantable loop recorder  Indication: Cryptogenic stroke  Description of procedure: After informed consent was obtained, the patient was prepped and draped in the usual manner. 20 cc of lidocaine was infiltrated in the left pectoral region. A 1 cm stab incision was carried out, and the Medtronic implantable loop recorder, serial number [id number] was inserted under the skin. The R waves measured 0.4 mV. Benzoin and Steri-Strips were pain over the skin. A pressure dressing was held. The patient was returned to his room in satisfactory condition.  Complications: No immediate procedural complications  Conclusion: Successful implantation of a [employer] implantable loop recorder in a patient with cryptogenic stroke  Cristopher Peru, M.D.

## 2013-08-31 NOTE — Interval H&P Note (Signed)
History and Physical Interval Note:  08/31/2013 11:06 AM  Coral Spikes  has presented today for surgery, with the diagnosis of STROKE  The various methods of treatment have been discussed with the patient and family. After consideration of risks, benefits and other options for treatment, the patient has consented to  Procedure(s): TRANSESOPHAGEAL ECHOCARDIOGRAM (TEE) (N/A) as a surgical intervention .  The patient's history has been reviewed, patient examined, no change in status, stable for surgery.  I have reviewed the patient's chart and labs.  Questions were answered to the patient's satisfaction.     Darden Amber.

## 2013-08-31 NOTE — Interval H&P Note (Signed)
History and Physical Interval Note:  08/31/2013 12:19 PM  Luke Allen  has presented today for surgery, with the diagnosis of stroke  The various methods of treatment have been discussed with the patient and family. After consideration of risks, benefits and other options for treatment, the patient has consented to  Procedure(s): LOOP RECORDER IMPLANT (N/A) as a surgical intervention .  The patient's history has been reviewed, patient examined, no change in status, stable for surgery.  I have reviewed the patient's chart and labs.  Questions were answered to the patient's satisfaction.     Mikle Bosworth.D.

## 2013-08-31 NOTE — H&P (View-Only) (Signed)
ELECTROPHYSIOLOGY CONSULT NOTE   Patient ID: Luke Allen MRN: [id number], DOB/AGE: June 15, 1935   Admit date: 08/27/2013 Date of Consult: 08/30/2013  Primary Physician: Arsenio Loader, MD [place], [place] Primary Cardiologist: New to [facility] Reason for Consultation: Cryptogenic stroke; recommendations regarding Implantable Loop Recorder  History of Present Illness Luke Allen is a 79 year old man with HTN, narcolepsy, OSA, abdominal aortic aneurysm s/p repair August 2014 and  tobacco abuse who was admitted on 08/27/2013 with acute CVA. Central retinal artery occlusion diagnosed. Further work has included MRI of the brain which revealed a small left parietal and right posterior frontal acute infarcts. Embolic etiology suspected. He has been monitored on telemetry with no evidence of atrial fibrillation. No cause has been identified. Inpatient stroke work-up is to be completed with a TEE. EP has been asked to evaluate for placement of an implantable loop recorder to monitor for atrial fibrillation.  Past Medical History Past Medical History  Diagnosis Date  . Acid reflux   . Anxiety   . Sleep apnea   . Narcolepsy   . Catalepsy   . Narcolepsy   . Sleep apnea   . Hypertension     Past Surgical History Past Surgical History  Procedure Laterality Date  . Arm surgery       Allergies/Intolerances No Known Allergies  Current Home Medications      albuterol 108 (90 BASE) MCG/ACT inhaler  Commonly known as:  PROVENTIL HFA;VENTOLIN HFA  Inhale 2 puffs into the lungs every 6 (six) hours as needed for wheezing or shortness of breath.     bisacodyl 5 MG EC tablet  Commonly known as:  DULCOLAX  Take 5 mg by mouth daily as needed for moderate constipation.     cetirizine 10 MG tablet  Commonly known as:  ZYRTEC  Take 10 mg by mouth daily as needed for allergies.     methylphenidate 10 MG tablet  Commonly known as:  RITALIN  Take 10 mg by mouth 2 (two) times daily as needed.     metoprolol tartrate 25 MG tablet  Commonly known as:  LOPRESSOR  Take 25 mg by mouth 2 (two) times daily.     nitroGLYCERIN 0.4 MG SL tablet  Commonly known as:  NITROSTAT  Place 0.4 mg under the tongue every 5 (five) minutes as needed for chest pain.     tamsulosin 0.4 MG Caps capsule  Commonly known as:  FLOMAX  Take 0.4 mg by mouth at bedtime.     venlafaxine XR 150 MG 24 hr capsule  Commonly known as:  EFFEXOR-XR  Take 150 mg by mouth 2 (two) times daily.     venlafaxine XR 37.5 MG 24 hr capsule  Commonly known as:  EFFEXOR-XR  Take 37.5 mg by mouth every evening.     Inpatient Medications . amLODipine  5 mg Oral Daily  . aspirin  325 mg Oral Daily  . atorvastatin  20 mg Oral q1800  . brimonidine  1 drop Right Eye BID  . hydrALAZINE  25 mg Oral BID  . methylphenidate  10 mg Oral q morning - 10a  . metoprolol tartrate  25 mg Oral BID  . nicotine  14 mg Transdermal Daily  . pantoprazole  40 mg Oral Daily  . sodium chloride  3 mL Intravenous Q12H  . tamsulosin  0.4 mg Oral QHS  . venlafaxine XR  150 mg Oral 2 times per day  . venlafaxine XR  37.5 mg Oral QHS  Social History History   Social History  . Marital Status: Single    Spouse Name: N/A    Number of Children: N/A  . Years of Education: N/A   Occupational History  . Not on file.   Social History Main Topics  . Smoking status: Never Smoker   . Smokeless tobacco: Not on file  . Alcohol Use: No  . Drug Use: No  . Sexual Activity:    Other Topics Concern  . Not on file   Social History Narrative  . No narrative on file    Review of Systems General: No chills, fever, night sweats or weight changes  Cardiovascular:  No chest pain, dyspnea on exertion, edema, orthopnea, palpitations, paroxysmal nocturnal dyspnea Dermatological: No rash, lesions or masses Respiratory: No cough, dyspnea Urologic: No hematuria, dysuria Abdominal: No nausea, vomiting, diarrhea, bright red blood per rectum, melena, or  hematemesis Neurologic: No visual changes, weakness, changes in mental status All other systems reviewed and are otherwise negative except as noted above.  Physical Exam Blood pressure 151/78, pulse 74, temperature 98.2 F (36.8 C), temperature source Oral, resp. rate 18, weight 240 lb 4.8 oz (108.999 kg), SpO2 91.00%.  General: Well developed, well appearing 79 y.o. male in no acute distress. HEENT: Normocephalic, atraumatic. EOMs intact. Sclera nonicteric. Oropharynx clear.  Neck: Supple. No JVD. Lungs: Respirations regular and unlabored, CTA bilaterally. No wheezes, rales or rhonchi. Heart: RRR. S1, S2 present. No murmurs, rub, S3 or S4. Abdomen: Soft, non-distended.  Extremities: No clubbing, cyanosis or edema. PT/Radials 2+ and equal bilaterally. Psych: Normal affect. Neuro: Alert and oriented X 3. Moves all extremities spontaneously. Musculoskeletal: No kyphosis. Skin: Intact. Warm and dry. No rashes or petechiae in exposed areas.   Labs Lab Results  Component Value Date   WBC 5.2 08/28/2013   HGB 13.2 08/28/2013   HCT 38.1* 08/28/2013   MCV 85.4 08/28/2013   PLT 218 08/28/2013    Recent Labs Lab 08/27/13 1723 08/28/13 0600  NA 144 144  K 3.9 4.1  CL 106 108  CO2 26 25  BUN 12 11  CREATININE 0.86 0.90  CALCIUM 9.0 9.0  PROT 6.4  --   BILITOT 0.3  --   ALKPHOS 107  --   ALT 20  --   AST 17  --   GLUCOSE 106* 103*    Recent Labs  08/27/13 1723  INR 1.06    Radiology/Studies Ct Head Wo Contrast  08/27/2013   CLINICAL DATA:  Vision loss involving the right eye  EXAM: CT HEAD WITHOUT CONTRAST  TECHNIQUE: Contiguous axial images were obtained from the base of the skull through the vertex without intravenous contrast.  COMPARISON:  CT HEAD W/O CM dated 02/12/2012; CT HEAD W/O CM dated 09/19/2011  FINDINGS: Mild atrophy with sulcal prominence. Scattered periventricular hypodensities compatible of microvascular ischemic disease. Given background parenchymal abnormalities,  there is no CT evidence of acute large territory infarct. No intraparenchymal or extra-axial mass or hemorrhage. Unchanged size and configuration of the ventricles and basilar cisterns. No midline shift. Intracranial atherosclerosis. Limited visualization of the paranasal sinuses and mastoid air cells is normal. Regional soft tissues appears normal. No displaced calvarial fracture.  IMPRESSION: Mild atrophy and microvascular ischemic disease without acute intracranial process.   Electronically Signed   By: Sandi Mariscal M.D.   On: 08/27/2013 17:52   [name]  08/29/2013   CLINICAL DATA:  Hypertensive emergency. Right eye visual loss with suspected central retinal artery occlusion.  EXAM: MRI HEAD WITHOUT CONTRAST  MRA HEAD WITHOUT CONTRAST  TECHNIQUE: Multiplanar, multiecho pulse sequences of the brain and surrounding structures were obtained without intravenous contrast. Angiographic images of the head were obtained using MRA technique without contrast.  COMPARISON:  CT HEAD W/O CM dated 08/27/2013; CT HEAD W/O CM dated 02/12/2012  FINDINGS: MRI HEAD FINDINGS  Patient was unable to complete the full exam. No FLAIR, gradient, or T1 weighted images are obtained as part of the MR brain study.  Subcentimeter focus of restricted diffusion affects the left anterior parietal subcortical white matter consistent with acute infarction. Lower level restricted diffusion with beginning normalization of ADC signal, right posterior frontal subcortical white matter, consistent with subacute infarction.  Within limits of detection on this nonstandard exam, no visible hemorrhage, mass lesion, or extra-axial fluid.  Moderate ventriculomegaly without corresponding cortical atrophy. Normal pressure hydrocephalus not excluded, versus central atrophy. Increased T2 bright signal in the periventricular and subcortical white matter likely chronic microvascular ischemic change.  MRA HEAD FINDINGS  Dolichoectatic and widely patent  internal carotid arteries and basilar artery. Vertebrals codominant without distal disease. No proximal intracranial stenosis or aneurysm. Right anterior cerebral artery dominant. Mild irregularity of the distal MCA and PCA branches, likely intracranial atherosclerotic change.  IMPRESSION: Bilateral subcentimeter supratentorial infarcts, acute on the left affecting the left parietal subcortical white matter, and subacute on the right affecting the posterior frontal subcortical region.  No proximal intracranial stenosis or vascular occlusion.  Prematurely truncated exam reveals moderate ventriculomegaly ; normal-pressure hydrocephalus is a consideration versus central atrophy. Moderate chronic microvascular ischemic change affects the periventricular and subcortical white matter.   Electronically Signed   By: Rolla Flatten M.D.   On: 08/29/2013 09:13   Echocardiogram  Study Conclusions Left ventricle: The cavity size was normal. Wall thickness was increased in a pattern of mild LVH. Systolic function was normal. The estimated ejection fraction was in the range of 60% to 65%. Wall motion was normal; there were no regional wall motion abnormalities. There was an increased relative contribution of atrial contraction to ventricular filling.   12-lead ECG none in EPIC Telemetry SR  Assessment and Plan 1. Cryptogenic stroke 2. HTN 3. OSA 4. Narcolepsy 5. AAA s/p repair August 2014 at [facility]  If the TEE is negative, we recommend loop recorder insertion to monitor for AF. The indication for loop recorder insertion / monitoring for AF in setting of cryptogenic stroke was discussed with the patient. The loop recorder insertion procedure was reviewed in detail including risks and benefits. These risks include but are not limited to bleeding and infection. The patient expressed verbal understanding and agrees to proceed. The patient was also counseled regarding wound care and device follow-up. As requested by  Luke Allen, I spoke with his sister, Patsy Lager, via phone who expressed verbal understanding and agrees with plan.  SignedIleene Hutchinson 08/30/2013, 2:17 PM  EP Attending  Patient seen and examined. Agree with the above history, physical exam, assessment and plan. He has unexplained stroke. Atrial fibrillation may well be the cause but we have not seen this on telemetry. If TEE which is scheduled tomorrow demonstrates no etiology to explain the stroke, then would recommend insertion of an ILR to monitor atrial fibrillation.  Mikle Bosworth.D.

## 2013-08-31 NOTE — H&P (View-Only) (Signed)
    [facility] has been requested to perform a transesophageal echocardiogram on Luke Allen for CVA.  After careful review of history and examination, the risks and benefits of transesophageal echocardiogram have been explained including risks of esophageal damage, perforation (1:10,000 risk), bleeding, pharyngeal hematoma as well as other potential complications associated with conscious sedation including aspiration, arrhythmia, respiratory failure and death. Alternatives to treatment were discussed, questions were answered.  Patient is willing to proceed.   Tarri Fuller, PA-C 08/30/2013 1:28 PM

## 2013-08-31 NOTE — Progress Notes (Signed)
OT Cancellation Note  Patient Details Name: Luke Allen MRN: [id number] DOB: Apr 06, 1935   Cancelled Treatment:    Reason Eval/Treat Not Completed: Patient at procedure or test/ unavailable (Pt at TEE)  [place], OTR/L  [phone] 08/31/2013 08/31/2013, 1:50 PM

## 2013-08-31 NOTE — Progress Notes (Signed)
Echocardiogram Echocardiogram Transesophageal has been performed.  Rosy Estabrook 08/31/2013, 11:38 AM

## 2013-08-31 NOTE — Progress Notes (Signed)
Stroke Team Progress Note  HISTORY Luke Allen is an 79 y.o. male who presents to the ED on 08/27/2013 with complaints of vision los in his right eye which occurred at 6:30 pm. He denied having any headaches or chest pain, and also denied having any Decreased LOC, or syncope. He was evaluated ain the ED and Ophthalmology was consulted and saw him as well in the ED and prescribed Alphagan Ophthalmic drops BID, and he was diagnosed with Central Retinal Artery Occlusion and cataracts were seen in both eyes. His blood pressures were found to be 093'G systolic and he was administered IV Labetalol x 1 with improvement in his blood pressure. He was referred for medical admission. Work has included a carotid doppler that shows no significant stenosis and an echocardiogram that was unremarkable.   SUBJECTIVE No change in vision per pt. He is lying in the bed with his CPAP on.  OBJECTIVE Most recent Vital Signs: Filed Vitals:   08/30/13 1130 08/30/13 1720 08/30/13 2036 08/31/13 0608  BP: 109/104 157/83 172/93 117/70  Pulse: 67  70 71  Temp: 98.6 F (37 C)  97.5 F (36.4 C) 97.5 F (36.4 C)  TempSrc: Oral  Oral Oral  Resp: 18  18 18   Weight:      SpO2: 93%  95% 95%   CBG (last 3)  No results found for this basename: GLUCAP,  in the last 72 hours  IV Fluid Intake:   . sodium chloride 20 mL/hr at 08/31/13 0709    MEDICATIONS  . amLODipine  5 mg Oral Daily  . aspirin  325 mg Oral Daily  . atorvastatin  20 mg Oral q1800  . brimonidine  1 drop Right Eye BID  . hydrALAZINE  25 mg Oral BID  . methylphenidate  10 mg Oral q morning - 10a  . metoprolol tartrate  25 mg Oral BID  . nicotine  14 mg Transdermal Daily  . pantoprazole  40 mg Oral Daily  . sodium chloride  3 mL Intravenous Q12H  . tamsulosin  0.4 mg Oral QHS  . venlafaxine XR  150 mg Oral 2 times per day  . venlafaxine XR  37.5 mg Oral QHS   PRN:  sodium chloride, acetaminophen, acetaminophen, ALPRAZolam, alum & mag hydroxide-simeth,  hydrALAZINE, HYDROmorphone (DILAUDID) injection, nitroGLYCERIN, ondansetron (ZOFRAN) IV, ondansetron, oxyCODONE, pseudoephedrine, senna-docusate, sodium chloride  Diet:  NPO thin liquids Activity:  Up with assistance DVT Prophylaxis:  SCDs   CLINICALLY SIGNIFICANT STUDIES Basic Metabolic Panel:   Recent Labs Lab 08/27/13 1723 08/28/13 0600  NA 144 144  K 3.9 4.1  CL 106 108  CO2 26 25  GLUCOSE 106* 103*  BUN 12 11  CREATININE 0.86 0.90  CALCIUM 9.0 9.0   Liver Function Tests:   Recent Labs Lab 08/27/13 1723  AST 17  ALT 20  ALKPHOS 107  BILITOT 0.3  PROT 6.4  ALBUMIN 3.7   CBC:   Recent Labs Lab 08/27/13 1723 08/28/13 0600  WBC 6.1 5.2  NEUTROABS 4.0  --   HGB 13.5 13.2  HCT 38.8* 38.1*  MCV 85.7 85.4  PLT 217 218   Coagulation:   Recent Labs Lab 08/27/13 1723  LABPROT 13.6  INR 1.06   Cardiac Enzymes: No results found for this basename: CKTOTAL, CKMB, CKMBINDEX, TROPONINI,  in the last 168 hours Urinalysis: No results found for this basename: COLORURINE, APPERANCEUR, LABSPEC, PHURINE, GLUCOSEU, HGBUR, BILIRUBINUR, KETONESUR, PROTEINUR, UROBILINOGEN, NITRITE, LEUKOCYTESUR,  in the last 168 hours Lipid Panel  Component Value Date/Time   CHOL 182 08/29/2013 0525   TRIG 175* 08/29/2013 0525   HDL 39* 08/29/2013 0525   CHOLHDL 4.7 08/29/2013 0525   VLDL 35 08/29/2013 0525   LDLCALC 108* 08/29/2013 0525   HgbA1C  Lab Results  Component Value Date   HGBA1C 5.6 08/29/2013    Urine Drug Screen:     Component Value Date/Time   LABOPIA NONE DETECTED 10/07/2011 2330   COCAINSCRNUR NONE DETECTED 10/07/2011 2330   LABBENZ NONE DETECTED 10/07/2011 2330   AMPHETMU NONE DETECTED 10/07/2011 2330   THCU NONE DETECTED 10/07/2011 2330   LABBARB NONE DETECTED 10/07/2011 2330    Alcohol Level: No results found for this basename: ETH,  in the last 168 hours   CT of the brain  08/27/2013 Mild atrophy and microvascular ischemic disease without acute intracranial  process.  MRI of the brain  08/29/2013    Bilateral subcentimeter supratentorial infarcts, acute on the left affecting the left parietal subcortical white matter, and subacute on the right affecting the posterior frontal subcortical region.  Prematurely truncated exam reveals moderate ventriculomegaly ; normal-pressure hydrocephalus is a consideration versus central atrophy. Moderate chronic microvascular ischemic change affects the periventricular and subcortical white matter.     MRA of the brain  08/29/2013   No proximal intracranial stenosis or vascular occlusion.    2D Echocardiogram  EF 60-65% with no source of embolus.   Carotid Doppler  No evidence of hemodynamically significant internal carotid artery stenosis. Vertebral artery flow is antegrade.   TEE  Therapy Recommendations no PT f/u, OT recommends possible ALF  Physical Exam   Awake alert. Afebrile. Head is nontraumatic. Neck is supple without bruit. Hearing is normal. Cardiac exam no murmur or gallop. Lungs are clear to auscultation. Distal pulses are well felt. Neurological Exam ;  Awake  Alert oriented x 3. Normal speech and language.eye movements full without nystagmus.fundi were not visualized. Vision acuity severely diminished in right eye with finger counting at 2 feet only and  Acuity andfields appear normal in left eye.Fundi not visualized. Hearing is normal. Palatal movements are normal. Face symmetric. Tongue midline. Normal strength, tone, reflexes and coordination. Normal sensation. Gait deferred.  ASSESSMENT [name] is a 79 y.o. male presenting with loss of vision in his right eye. Diagnosed with right CRAO. Further imaging confirms a small left parietal and right posterior frontal infarcts. Infarcts felt to be embolic secondary to unknown etiology.  On no antithrombotics prior to admission. Now on aspirin 325 mg orally every day for secondary stroke prevention. Patient with resultant vision loss. Work up  underway.  Hypertension Hyperlipidemia, LDL 108, on no statin PTA, now on lipitor 20 mg daily, goal LDL < 100 obstructive sleep apnea, uses CPAP narcolepsy  Hospital day # 4  TREATMENT/PLAN  Continue aspirin 325 mg orally every day for secondary stroke prevention. TEE today 11A Dr. Acie Fredrickson. If positive for PFO (patent foramen ovale), check bilateral lower extremity venous dopplers to rule out DVT as possible source of stroke.  If TEE negative, a [place] electrophysiologist will place implantable loop recorder to evaluate for atrial fibrillation as etiology of stroke.   Burnetta Sabin, MSN, RN, ANVP-BC, ANP-BC, [facility] Pager: [phone] 08/31/2013 9:28 AM  I have personally obtained a history, examined the patient, evaluated imaging results, and formulated the assessment and plan of care. I agree with the above.  Antony Contras, MD

## 2013-09-01 ENCOUNTER — Encounter (HOSPITAL_COMMUNITY): Payer: Self-pay | Admitting: Cardiovascular Disease

## 2013-09-01 MED ORDER — ATORVASTATIN CALCIUM 20 MG PO TABS: 20.0000 mg | ORAL_TABLET | Freq: Every day | ORAL | Status: DC

## 2013-09-01 MED ORDER — AMLODIPINE BESYLATE 5 MG PO TABS: 5.0000 mg | ORAL_TABLET | Freq: Every day | ORAL | Status: DC

## 2013-09-01 MED ORDER — ASPIRIN 325 MG PO TABS: 325.0000 mg | ORAL_TABLET | Freq: Every day | ORAL | Status: DC

## 2013-09-01 MED ORDER — HYDRALAZINE HCL 25 MG PO TABS: 25.0000 mg | ORAL_TABLET | Freq: Two times a day (BID) | ORAL | Status: DC

## 2013-09-01 MED ORDER — BRIMONIDINE TARTRATE 0.2 % OP SOLN: 1.0000 [drp] | Freq: Two times a day (BID) | OPHTHALMIC | Status: DC

## 2013-09-01 NOTE — Progress Notes (Signed)
Stroke Team Progress Note  HISTORY Luke Allen is an 79 y.o. male who presents to the ED on 08/27/2013 with complaints of vision los in his right eye which occurred at 6:30 pm. He denied having any headaches or chest pain, and also denied having any Decreased LOC, or syncope. He was evaluated ain the ED and Ophthalmology was consulted and saw him as well in the ED and prescribed Alphagan Ophthalmic drops BID, and he was diagnosed with Central Retinal Artery Occlusion and cataracts were seen in both eyes. His blood pressures were found to be 992'E systolic and he was administered IV Labetalol x 1 with improvement in his blood pressure. He was referred for medical admission. Work has included a carotid doppler that shows no significant stenosis and an echocardiogram that was unremarkable.   SUBJECTIVE No family at bedside.  OBJECTIVE Most recent Vital Signs: Filed Vitals:   08/31/13 1150 08/31/13 1200 08/31/13 2038 09/01/13 0627  BP: 179/110 168/86 142/65 144/81  Pulse: 65 73 72 61  Temp:   98 F (36.7 C) 97.5 F (36.4 C)  TempSrc:   Oral Oral  Resp: 20 16 18 18   Weight:      SpO2: 96% 97% 95% 92%   CBG (last 3)  No results found for this basename: GLUCAP,  in the last 72 hours  IV Fluid Intake:      MEDICATIONS  . amLODipine  5 mg Oral Daily  . aspirin  325 mg Oral Daily  . atorvastatin  20 mg Oral q1800  . brimonidine  1 drop Right Eye BID  . heparin subcutaneous  5,000 Units Subcutaneous 3 times per day  . hydrALAZINE  25 mg Oral BID  . methylphenidate  10 mg Oral q morning - 10a  . metoprolol tartrate  25 mg Oral BID  . nicotine  14 mg Transdermal Daily  . pantoprazole  40 mg Oral Daily  . sodium chloride  3 mL Intravenous Q12H  . tamsulosin  0.4 mg Oral QHS  . venlafaxine XR  150 mg Oral 2 times per day  . venlafaxine XR  37.5 mg Oral QHS   PRN:  sodium chloride, acetaminophen, acetaminophen, acetaminophen, ALPRAZolam, alum & mag hydroxide-simeth, hydrALAZINE, HYDROmorphone  (DILAUDID) injection, nitroGLYCERIN, ondansetron (ZOFRAN) IV, ondansetron (ZOFRAN) IV, ondansetron, oxyCODONE, pseudoephedrine, senna-docusate, sodium chloride  Diet:  General thin liquids Activity:  Up with assistance DVT Prophylaxis:  SCDs   CLINICALLY SIGNIFICANT STUDIES Basic Metabolic Panel:   Recent Labs Lab 08/27/13 1723 08/28/13 0600 08/31/13 1315  NA 144 144  --   K 3.9 4.1  --   CL 106 108  --   CO2 26 25  --   GLUCOSE 106* 103*  --   BUN 12 11  --   CREATININE 0.86 0.90 0.87  CALCIUM 9.0 9.0  --    Liver Function Tests:   Recent Labs Lab 08/27/13 1723  AST 17  ALT 20  ALKPHOS 107  BILITOT 0.3  PROT 6.4  ALBUMIN 3.7   CBC:   Recent Labs Lab 08/27/13 1723 08/28/13 0600 08/31/13 1315  WBC 6.1 5.2 6.2  NEUTROABS 4.0  --   --   HGB 13.5 13.2 15.1  HCT 38.8* 38.1* 43.0  MCV 85.7 85.4 85.7  PLT 217 218 214   Coagulation:   Recent Labs Lab 08/27/13 1723  LABPROT 13.6  INR 1.06   Cardiac Enzymes: No results found for this basename: CKTOTAL, CKMB, CKMBINDEX, TROPONINI,  in the last 168 hours  Urinalysis: No results found for this basename: COLORURINE, APPERANCEUR, LABSPEC, [place], GLUCOSEU, HGBUR, BILIRUBINUR, KETONESUR, PROTEINUR, UROBILINOGEN, NITRITE, LEUKOCYTESUR,  in the last 168 hours Lipid Panel    Component Value Date/Time   CHOL 182 08/29/2013 0525   TRIG 175* 08/29/2013 0525   HDL 39* 08/29/2013 0525   CHOLHDL 4.7 08/29/2013 0525   VLDL 35 08/29/2013 0525   LDLCALC 108* 08/29/2013 0525   HgbA1C  Lab Results  Component Value Date   HGBA1C 5.6 08/29/2013    Urine Drug Screen:     Component Value Date/Time   LABOPIA NONE DETECTED 10/07/2011 2330   COCAINSCRNUR NONE DETECTED 10/07/2011 2330   LABBENZ NONE DETECTED 10/07/2011 2330   AMPHETMU NONE DETECTED 10/07/2011 2330   THCU NONE DETECTED 10/07/2011 2330   LABBARB NONE DETECTED 10/07/2011 2330    Alcohol Level: No results found for this basename: ETH,  in the last 168 hours   CT of  the brain  08/27/2013 Mild atrophy and microvascular ischemic disease without acute intracranial process.  MRI of the brain  08/29/2013    Bilateral subcentimeter supratentorial infarcts, acute on the left affecting the left parietal subcortical white matter, and subacute on the right affecting the posterior frontal subcortical region.  Prematurely truncated exam reveals moderate ventriculomegaly ; normal-pressure hydrocephalus is a consideration versus central atrophy. Moderate chronic microvascular ischemic change affects the periventricular and subcortical white matter.     MRA of the brain  08/29/2013   No proximal intracranial stenosis or vascular occlusion.    2D Echocardiogram  EF 60-65% with no source of embolus.   Carotid Doppler  No evidence of hemodynamically significant internal carotid artery stenosis. Vertebral artery flow is antegrade.   TEE no PFO, no thrombi or source of embolus  Therapy Recommendations no PT f/u, OT recommends possible ALF  Physical Exam   Awake alert. Afebrile. Head is nontraumatic. Neck is supple without bruit. Hearing is normal. Cardiac exam no murmur or gallop. Lungs are clear to auscultation. Distal pulses are well felt. Neurological Exam ;  Awake  Alert oriented x 3. Normal speech and language.eye movements full without nystagmus.fundi were not visualized. Vision acuity severely diminished in right eye with finger counting at 2 feet only and  Acuity andfields appear normal in left eye.Fundi not visualized. Hearing is normal. Palatal movements are normal. Face symmetric. Tongue midline. Normal strength, tone, reflexes and coordination. Normal sensation. Gait deferred.  ASSESSMENT [name] is a 79 y.o. male presenting with loss of vision in his right eye. Diagnosed with right CRAO. Further imaging confirms a small left parietal and right posterior frontal infarcts. Infarcts felt to be embolic secondary to unknown etiology. TEE negative. Loop recorder  placed.  On no antithrombotics prior to admission. Now on aspirin 325 mg orally every day for secondary stroke prevention. Patient with resultant vision loss. Work up completed.  Hypertension Hyperlipidemia, LDL 108, on no statin PTA, now on lipitor 20 mg daily, goal LDL < 100 obstructive sleep apnea, uses CPAP narcolepsy  Hospital day # 5  TREATMENT/PLAN  Continue aspirin 325 mg orally every day for secondary stroke prevention.  Cardiology will monitor loop recordings and communicate any findings to neurology  No driving  Consider services for the blind - discussed with Dr. Wendee Beavers will arrange  No further stroke workup indicated. Patient has a 10-15% risk of having another stroke over the next year, the highest risk is within 2 weeks of the most recent stroke/TIA (risk of having a stroke following  a stroke or TIA is the same). Ongoing risk factor control by Primary Care Physician Stroke Service will sign off. Please call should any needs arise. Follow up with Dr. Leonie Man, [facility], in 2 months.   Burnetta Sabin, MSN, RN, ANVP-BC, ANP-BC, [facility] Pager: [phone] 09/01/2013 9:37 AM  I have personally obtained a history, examined the patient, evaluated imaging results, and formulated the assessment and plan of care. I agree with the above. Antony Contras, MD

## 2013-09-01 NOTE — Discharge Instructions (Signed)
° °  Supplemental Discharge Instructions  following Loop Recorder Insertion   WOUND CARE   Keep the wound area clean and dry.  Do not get this area wet for one week. No showers for one week; you may shower on 09/08/2013.   The tape/steri-strips on your wound will fall off; do not pull them off.  No bandage is needed on the site.  DO  NOT apply any creams, oils, or ointments to the wound area.   If you notice any drainage or discharge from the wound, any swelling or bruising at the site, or you develop a fever > 101? F after you are discharged home, call the office at once.    Home Health Care to be provided by [facility] [phone]

## 2013-09-01 NOTE — Discharge Summary (Signed)
Physician Discharge Summary  Luke Allen PPI:[id number] DOB: 04/20/1935 DOA: 08/27/2013  PCP: Pcp Not In System  Admit date: 08/27/2013 Discharge date: 09/01/2013  Time spent: > 35 minutes  Recommendations for Outpatient Follow-up:  1. Followup with neurologist in 2 months or sooner should any new concerns arise 2. Monitor blood pressure and adjust antihypertensive accordingly  Discharge Diagnoses:  Principal Problem:   Central retinal artery occlusion of right eye Active Problems:   Hypertensive emergency   Acid reflux   Anxiety   Sleep apnea   CVA (cerebral infarction)   Discharge Condition: Stable  Diet recommendation: Heart healthy/low sodium  Filed Weights   08/28/13 0107  Weight: 108.999 kg (240 lb 4.8 oz)    History of present illness:  79 year old Caucasian male with history of hypertension who presented to the ED with central retinal artery occlusion and acute CVA.  Hospital Course:  1. CRAO -BP control  -seen by ophthalmology Dr.Spencer in ER appreciate input, needs FU  -continue Alphagan eye Drops  -MRI this am positive for b/l subcentimeter infarcts  - Patient to followup at [facility] post d/c  2. Bilateral CVA  -likely embolic, noted on MRI  -continue ASA  -ECHO completed and unremarkable  -carotid duplex 1-39% stenosis  -LDL 108, will start low dose statin  -Evaluated by neurology while in house and they recommended the following: Continue aspirin 325 mg orally every day for secondary stroke prevention.  Cardiology will monitor loop recordings and communicate any findings to neurology  No driving  Consider services for the blind - discussed with Dr. Wendee Beavers will arrange No further stroke workup indicated.  Patient has a 10-15% risk of having another stroke over the next year, the highest risk is within 2 weeks of the most recent stroke/TIA (risk of having a stroke following a stroke or TIA is the same).  Ongoing risk factor control by Primary Care  Physician  Stroke Service will sign off. Please call should any needs arise.  Follow up with Dr. Leonie Man, [facility], in 2 months  Discussed occupational therapy recommendations with patient. At this point patient would like to find placement into assisted living facility. This is been discussed with case manager  3. Hypertensive emergency  -Continue metoprolol, hydralazine, amlodipine  4. H/o CAD/PCI  -continue ASA,resumed BB on discharge  5. Anxiety  -Will discontinue xanax (pt has narcolepsy) - Patient to continue outpatient evaluation by PCP - Continue venlafaxine  6. Tobacco use  -Recommended cessation  7. OSA -CPAP Qhs   Code Status: Full Code  Family Communication: no family at bedside  Disposition Plan: home tomorrow   Procedures:  None  Consultations:  Ophthalmology  Neurology  Electrophysiology  Discharge Exam: Filed Vitals:   09/01/13 0627  BP: 144/81  Pulse: 61  Temp: 97.5 F (36.4 C)  Resp: 18    General: Pt in NAD, Alert and awake Cardiovascular: RRR, no MRG Respiratory: CTA BL, no wheezes  Discharge Instructions  Discharge Orders   Future Appointments Provider Department Dept Phone   09/12/2013 2:30 PM [facility] [place] [facility] [phone]   Future Orders Complete By Expires   Call MD for:  extreme fatigue  As directed    Call MD for:  temperature >100.4  As directed    Diet - low sodium heart healthy  As directed    Diet - low sodium heart healthy  As directed    Discharge instructions  As directed    Comments:  Pt to follow up with neurologist in 3-4 weeks or sooner should any new concerns arise   Driving Restrictions  As directed    Comments:     No driving per neurology   Increase activity slowly  As directed    Increase activity slowly  As directed        Medication List         albuterol 108 (90 BASE) MCG/ACT inhaler  Commonly known as:  PROVENTIL HFA;VENTOLIN HFA  Inhale 2 puffs into the  lungs every 6 (six) hours as needed for wheezing or shortness of breath.     amLODipine 5 MG tablet  Commonly known as:  NORVASC  Take 1 tablet (5 mg total) by mouth daily.     aspirin 325 MG tablet  Take 1 tablet (325 mg total) by mouth daily.     atorvastatin 20 MG tablet  Commonly known as:  LIPITOR  Take 1 tablet (20 mg total) by mouth daily at 6 PM.     bisacodyl 5 MG EC tablet  Commonly known as:  DULCOLAX  Take 5 mg by mouth daily as needed for moderate constipation.     cetirizine 10 MG tablet  Commonly known as:  ZYRTEC  Take 10 mg by mouth daily as needed for allergies.     hydrALAZINE 25 MG tablet  Commonly known as:  APRESOLINE  Take 1 tablet (25 mg total) by mouth 2 (two) times daily at 10 AM and 5 PM.     methylphenidate 10 MG tablet  Commonly known as:  RITALIN  Take 10 mg by mouth 2 (two) times daily as needed.     metoprolol tartrate 25 MG tablet  Commonly known as:  LOPRESSOR  Take 25 mg by mouth 2 (two) times daily.     nitroGLYCERIN 0.4 MG SL tablet  Commonly known as:  NITROSTAT  Place 0.4 mg under the tongue every 5 (five) minutes as needed for chest pain.     omeprazole 20 MG capsule  Commonly known as:  PRILOSEC  Take 20 mg by mouth daily.     tamsulosin 0.4 MG Caps capsule  Commonly known as:  FLOMAX  Take 0.4 mg by mouth at bedtime.     triamcinolone ointment 0.5 %  Commonly known as:  KENALOG  Apply 1 application topically 2 (two) times daily. Apply to affected area     venlafaxine XR 150 MG 24 hr capsule  Commonly known as:  EFFEXOR-XR  Take 150 mg by mouth 2 (two) times daily.     venlafaxine XR 37.5 MG 24 hr capsule  Commonly known as:  EFFEXOR-XR  Take 37.5 mg by mouth every evening.     vitamin C 500 MG tablet  Commonly known as:  ASCORBIC ACID  Take 500 mg by mouth daily.       No Known Allergies     Follow-up Information   Follow up with [facility] On 09/12/2013. (At 2:30 PM for wound check)     Specialty:  Cardiology   Contact information:   [address], [address] [place] [address] [phone]      Follow up with Forbes Cellar, MD. Schedule an appointment as soon as possible for a visit in 2 months. (Stroke Clinic)    Specialties:  Neurology, Radiology   Contact information:   [address] [place] [address] [phone]        The results of significant  diagnostics from this hospitalization (including imaging, microbiology, ancillary and laboratory) are listed below for reference.    Significant Diagnostic Studies: Ct Head Wo Contrast  08/27/2013   CLINICAL DATA:  Vision loss involving the right eye  EXAM: CT HEAD WITHOUT CONTRAST  TECHNIQUE: Contiguous axial images were obtained from the base of the skull through the vertex without intravenous contrast.  COMPARISON:  CT HEAD W/O CM dated 02/12/2012; CT HEAD W/O CM dated 09/19/2011  FINDINGS: Mild atrophy with sulcal prominence. Scattered periventricular hypodensities compatible of microvascular ischemic disease. Given background parenchymal abnormalities, there is no CT evidence of acute large territory infarct. No intraparenchymal or extra-axial mass or hemorrhage. Unchanged size and configuration of the ventricles and basilar cisterns. No midline shift. Intracranial atherosclerosis. Limited visualization of the paranasal sinuses and mastoid air cells is normal. Regional soft tissues appears normal. No displaced calvarial fracture.  IMPRESSION: Mild atrophy and microvascular ischemic disease without acute intracranial process.   Electronically Signed   By: Sandi Mariscal M.D.   On: 08/27/2013 17:52   [name]  08/29/2013   CLINICAL DATA:  Hypertensive emergency. Right eye visual loss with suspected central retinal artery occlusion.  EXAM: MRI HEAD WITHOUT CONTRAST  MRA HEAD WITHOUT CONTRAST  TECHNIQUE: Multiplanar, multiecho pulse sequences of the brain and surrounding structures were  obtained without intravenous contrast. Angiographic images of the head were obtained using MRA technique without contrast.  COMPARISON:  CT HEAD W/O CM dated 08/27/2013; CT HEAD W/O CM dated 02/12/2012  FINDINGS: MRI HEAD FINDINGS  Patient was unable to complete the full exam. No FLAIR, gradient, or T1 weighted images are obtained as part of the MR brain study.  Subcentimeter focus of restricted diffusion affects the left anterior parietal subcortical white matter consistent with acute infarction. Lower level restricted diffusion with beginning normalization of ADC signal, right posterior frontal subcortical white matter, consistent with subacute infarction.  Within limits of detection on this nonstandard exam, no visible hemorrhage, mass lesion, or extra-axial fluid.  Moderate ventriculomegaly without corresponding cortical atrophy. Normal pressure hydrocephalus not excluded, versus central atrophy. Increased T2 bright signal in the periventricular and subcortical white matter likely chronic microvascular ischemic change.  MRA HEAD FINDINGS  Dolichoectatic and widely patent internal carotid arteries and basilar artery. Vertebrals codominant without distal disease. No proximal intracranial stenosis or aneurysm. Right anterior cerebral artery dominant. Mild irregularity of the distal MCA and PCA branches, likely intracranial atherosclerotic change.  IMPRESSION: Bilateral subcentimeter supratentorial infarcts, acute on the left affecting the left parietal subcortical white matter, and subacute on the right affecting the posterior frontal subcortical region.  No proximal intracranial stenosis or vascular occlusion.  Prematurely truncated exam reveals moderate ventriculomegaly ; normal-pressure hydrocephalus is a consideration versus central atrophy. Moderate chronic microvascular ischemic change affects the periventricular and subcortical white matter.   Electronically Signed   By: Rolla Flatten M.D.   On: 08/29/2013 09:13    [name] Head/brain Wo Cm  08/29/2013   CLINICAL DATA:  Hypertensive emergency. Right eye visual loss with suspected central retinal artery occlusion.  EXAM: MRI HEAD WITHOUT CONTRAST  MRA HEAD WITHOUT CONTRAST  TECHNIQUE: Multiplanar, multiecho pulse sequences of the brain and surrounding structures were obtained without intravenous contrast. Angiographic images of the head were obtained using MRA technique without contrast.  COMPARISON:  CT HEAD W/O CM dated 08/27/2013; CT HEAD W/O CM dated 02/12/2012  FINDINGS: MRI HEAD FINDINGS  Patient was unable to complete the full exam. No FLAIR, gradient, or T1  weighted images are obtained as part of the MR brain study.  Subcentimeter focus of restricted diffusion affects the left anterior parietal subcortical white matter consistent with acute infarction. Lower level restricted diffusion with beginning normalization of ADC signal, right posterior frontal subcortical white matter, consistent with subacute infarction.  Within limits of detection on this nonstandard exam, no visible hemorrhage, mass lesion, or extra-axial fluid.  Moderate ventriculomegaly without corresponding cortical atrophy. Normal pressure hydrocephalus not excluded, versus central atrophy. Increased T2 bright signal in the periventricular and subcortical white matter likely chronic microvascular ischemic change.  MRA HEAD FINDINGS  Dolichoectatic and widely patent internal carotid arteries and basilar artery. Vertebrals codominant without distal disease. No proximal intracranial stenosis or aneurysm. Right anterior cerebral artery dominant. Mild irregularity of the distal MCA and PCA branches, likely intracranial atherosclerotic change.  IMPRESSION: Bilateral subcentimeter supratentorial infarcts, acute on the left affecting the left parietal subcortical white matter, and subacute on the right affecting the posterior frontal subcortical region.  No proximal intracranial stenosis or vascular occlusion.   Prematurely truncated exam reveals moderate ventriculomegaly ; normal-pressure hydrocephalus is a consideration versus central atrophy. Moderate chronic microvascular ischemic change affects the periventricular and subcortical white matter.   Electronically Signed   By: Rolla Flatten M.D.   On: 08/29/2013 09:13    Microbiology: No results found for this or any previous visit (from the past 240 hour(s)).   Labs: Basic Metabolic Panel:  Recent Labs Lab 08/27/13 1723 08/28/13 0600 08/31/13 1315  NA 144 144  --   K 3.9 4.1  --   CL 106 108  --   CO2 26 25  --   GLUCOSE 106* 103*  --   BUN 12 11  --   CREATININE 0.86 0.90 0.87  CALCIUM 9.0 9.0  --    Liver Function Tests:  Recent Labs Lab 08/27/13 1723  AST 17  ALT 20  ALKPHOS 107  BILITOT 0.3  PROT 6.4  ALBUMIN 3.7   No results found for this basename: LIPASE, AMYLASE,  in the last 168 hours No results found for this basename: AMMONIA,  in the last 168 hours CBC:  Recent Labs Lab 08/27/13 1723 08/28/13 0600 08/31/13 1315  WBC 6.1 5.2 6.2  NEUTROABS 4.0  --   --   HGB 13.5 13.2 15.1  HCT 38.8* 38.1* 43.0  MCV 85.7 85.4 85.7  PLT 217 218 214   Cardiac Enzymes: No results found for this basename: CKTOTAL, CKMB, CKMBINDEX, TROPONINI,  in the last 168 hours BNP: BNP (last 3 results) No results found for this basename: PROBNP,  in the last 8760 hours CBG: No results found for this basename: GLUCAP,  in the last 168 hours     Signed:  Velvet Bathe  [facility] 09/01/2013, 10:16 AM

## 2013-09-01 NOTE — Progress Notes (Signed)
Occupational Therapy Treatment Patient Details Name: Luke Allen MRN: [id number] DOB: [birth year] Today's Date: 09/01/2013 Time: 8413-2440 OT Time Calculation (min): 63 min  OT Assessment / Plan / Recommendation  History of present illness Pt adm due to loss of vision Rt eye. Diagnosed with Central Retinal Artery Occlusion and admitted due to SBP's >200. MRI Brain-Bilateral subcentimeter supratentorial infarcts, acute on the left. PMHx includes HTN, cataracts, and narcolepsy   OT comments  Pt continues to be a high fall risk with a hx of falls due to his narcolepsy and low vision.  Long conversation with pt about risks of being home alone, inability to drive or manage medications due to vision, or care for his home. Pt with unrealistic expectations of the future.  RN reports night time confusion. Continue to recommend 24 hour supervision.   Follow Up Recommendations  Other (comment);Supervision/Assistance - 24 hour (ALF)    Barriers to Discharge       Equipment Recommendations       Recommendations for Other Services    Frequency Min 2X/week   Progress towards OT Goals Progress towards OT goals: Progressing toward goals  Plan Discharge plan remains appropriate    Precautions / Restrictions Precautions Precautions: Fall Precaution Comments: fall risk due to decr vision and narcolepsy   Pertinent Vitals/Pain See flow sheet for pain, VSS   ADL  Grooming: Wash/dry hands;Brushing hair;Wash/dry face;Supervision/safety Where Assessed - Grooming: Unsupported standing Toilet Transfer: Copy Method: Sit to Loss adjuster, chartered: Regular height toilet Toileting - Clothing Manipulation and Hygiene: Supervision/safety Where Assessed - Toileting Clothing Manipulation and Hygiene: Sit to stand from 3-in-1 or toilet Transfers/Ambulation Related to ADLs: Supervision due to narcolepsy and low vision ADL Comments: Pt slower with ADL due to loss of vision.  With extended time, was able to locate 10/10 items for ADL around room.  Instructed pt on visually scanning environment for obstacles including cords, trash can, same color linen on floor. Pt able to demonstrate ability to see telephone and place a call, but not his small print such as would be needed for managing his own meds.      OT Diagnosis:    OT Problem List:   OT Treatment Interventions:     OT Goals(current goals can now be found in the care plan section) Acute Rehab OT Goals Time For Goal Achievement: 09/12/13 ADL Goals Pt Will Transfer to Toilet: with modified independence;ambulating;regular height toilet Additional ADL Goal #1: Pt will verbalize understanding of importance of large  visually scanning environment for obstacles prior to mobilizing to reduce risk of falls. Additional ADL Goal #2: Pt will locate 10/10 objects in functional visual field during ADL tasks without vc.  Visit Information  Last OT Received On: 09/01/13 Assistance Needed: +1 History of Present Illness: Pt adm due to loss of vision Rt eye. Diagnosed with Central Retinal Artery Occlusion and admitted due to SBP's >200. MRI Brain-Bilateral subcentimeter supratentorial infarcts, acute on the left. PMHx includes HTN, cataracts, and narcolepsy    Subjective Data      Prior Functioning       Cognition  Cognition Arousal/Alertness: Awake/alert Behavior During Therapy: WFL for tasks assessed/performed Overall Cognitive Status: Impaired/Different from baseline Area of Impairment: Safety/judgement Safety/Judgement: Decreased awareness of deficits;Decreased awareness of safety General Comments: Pt has poor insight as to the difficulties he faces and fall risk with low vision and narcolepsy.    Mobility  Bed Mobility Overal bed mobility: Independent Transfers Overall transfer level: Needs assistance Transfers:  Sit to/from Stand Sit to Stand: Supervision    Exercises      Balance    End of Session OT  - End of Session Equipment Utilized During Treatment: Gait belt Activity Tolerance: Patient tolerated treatment well Patient left: in bed;with call bell/phone within reach Nurse Communication: Mobility status (fall risk, recommendation for 24 hour care)  GO     Malka So 09/01/2013, 11:20 AM [phone]

## 2013-09-01 NOTE — Progress Notes (Signed)
Chart review complete.  Patient is not eligible for [facility] Care Management services because his/her PCP is not a [facility] primary care provider or is not [facility] affiliated.  For any additional questions or new referrals please contact Tim Henderson BSN RN [facility] Liaison at [phone] °

## 2013-09-01 NOTE — Progress Notes (Signed)
Speech Language Pathology Treatment: Cognitive-Linquistic  Patient Details Name: Ravi Tuccillo MRN: [id number] DOB: March 12, 1935 Today's Date: 09/01/2013 Time: 4174-0814 SLP Time Calculation (min): 25 min  Assessment / Plan / Recommendation Clinical Impression  Moderate cognitive impairments of memory and attention persist. Treatment focused on educating pt and caregiver, Ivin Booty on strategies for medication management. They were also instructed that Mr. Lachman would benefit from assistance managing his finances and appointments. Memory and attention strategies were identified. They both verbalized awareness that Mr. Raper cognitive impairments reduce his safety with ADL's. I recommend he receive HHST upon d/c.   HPI HPI: Benji Poynter is a 79 year old man with HTN, narcolepsy, OSA, abdominal aortic aneurysm s/p repair August 2014 and  tobacco abuse who was admitted on 08/27/2013 with acute CVA. Central retinal artery occlusion diagnosed. Further work has included MRI of the brain which revealed a small left parietal and right posterior frontal acute infarcts. Embolic etiology suspected. He has been monitored on telemetry with no evidence of atrial fibrillation. No cause has been identified. Inpatient stroke work-up is to be completed with a TEE. EP has been asked to evaluate for placement of an implantable loop recorder to monitor for atrial fibrillation.   Pertinent Vitals Denied pain  SLP Plan  Continue with current plan of care    Recommendations  As plan is to d/c home, recommend HHST              Follow up Recommendations: Home health SLP;24 hour supervision/assistance Plan: Continue with current plan of care    GO     Lovvorn, Annye Rusk 09/01/2013, 1:18 PM

## 2013-09-12 ENCOUNTER — Ambulatory Visit (INDEPENDENT_AMBULATORY_CARE_PROVIDER_SITE_OTHER): Payer: Medicare Other | Admitting: *Deleted

## 2013-09-12 DIAGNOSIS — Z4509 Encounter for adjustment and management of other cardiac device: Secondary | ICD-10-CM

## 2013-09-12 LAB — MDC_IDC_ENUM_SESS_TYPE_INCLINIC

## 2013-09-12 NOTE — Progress Notes (Signed)
Wound/loop check appointment. Steri-strips removed. Wound without redness or edema. Incision edges approximated, wound well healed. Normal device function. Loop check in clinic.  Pt with 0 tachy episodes; 0 brady episodes; 0 asystole. Plan to follow on monthly [facility] summary reports.   ROV w/ Dr. Lovena Le PRN.

## 2013-10-03 ENCOUNTER — Ambulatory Visit (INDEPENDENT_AMBULATORY_CARE_PROVIDER_SITE_OTHER): Payer: Medicare Other | Admitting: *Deleted

## 2013-10-03 DIAGNOSIS — I639 Cerebral infarction, unspecified: Secondary | ICD-10-CM

## 2013-10-03 DIAGNOSIS — I6350 Cerebral infarction due to unspecified occlusion or stenosis of unspecified cerebral artery: Secondary | ICD-10-CM

## 2013-10-03 LAB — MDC_IDC_ENUM_SESS_TYPE_REMOTE

## 2013-10-05 ENCOUNTER — Encounter: Payer: Self-pay | Admitting: Internal Medicine

## 2013-10-11 DIAGNOSIS — G47419 Narcolepsy without cataplexy: Secondary | ICD-10-CM

## 2013-10-11 DIAGNOSIS — I69919 Unspecified symptoms and signs involving cognitive functions following unspecified cerebrovascular disease: Secondary | ICD-10-CM

## 2013-10-11 DIAGNOSIS — R569 Unspecified convulsions: Secondary | ICD-10-CM

## 2013-10-11 DIAGNOSIS — I10 Essential (primary) hypertension: Secondary | ICD-10-CM

## 2013-10-11 DIAGNOSIS — G4733 Obstructive sleep apnea (adult) (pediatric): Secondary | ICD-10-CM

## 2013-11-02 ENCOUNTER — Ambulatory Visit (INDEPENDENT_AMBULATORY_CARE_PROVIDER_SITE_OTHER): Payer: Medicare Other | Admitting: *Deleted

## 2013-11-02 DIAGNOSIS — I6350 Cerebral infarction due to unspecified occlusion or stenosis of unspecified cerebral artery: Secondary | ICD-10-CM

## 2013-11-02 DIAGNOSIS — I639 Cerebral infarction, unspecified: Secondary | ICD-10-CM

## 2013-11-02 LAB — MDC_IDC_ENUM_SESS_TYPE_REMOTE

## 2013-11-14 ENCOUNTER — Ambulatory Visit (INDEPENDENT_AMBULATORY_CARE_PROVIDER_SITE_OTHER): Payer: Medicare Other

## 2013-11-14 DIAGNOSIS — I6350 Cerebral infarction due to unspecified occlusion or stenosis of unspecified cerebral artery: Secondary | ICD-10-CM

## 2013-11-14 DIAGNOSIS — I639 Cerebral infarction, unspecified: Secondary | ICD-10-CM

## 2013-11-14 LAB — MDC_IDC_ENUM_SESS_TYPE_REMOTE

## 2013-11-17 ENCOUNTER — Encounter: Payer: Self-pay | Admitting: Internal Medicine

## 2013-11-24 DIAGNOSIS — G4733 Obstructive sleep apnea (adult) (pediatric): Secondary | ICD-10-CM

## 2013-11-24 DIAGNOSIS — I10 Essential (primary) hypertension: Secondary | ICD-10-CM

## 2013-11-24 DIAGNOSIS — I69919 Unspecified symptoms and signs involving cognitive functions following unspecified cerebrovascular disease: Secondary | ICD-10-CM

## 2013-11-24 DIAGNOSIS — R569 Unspecified convulsions: Secondary | ICD-10-CM

## 2013-12-04 ENCOUNTER — Emergency Department (HOSPITAL_COMMUNITY): Payer: Medicare Other

## 2013-12-04 ENCOUNTER — Encounter (HOSPITAL_COMMUNITY): Payer: Self-pay | Admitting: Emergency Medicine

## 2013-12-04 ENCOUNTER — Emergency Department (HOSPITAL_COMMUNITY)
Admission: EM | Admit: 2013-12-04 | Discharge: 2013-12-04 | Disposition: A | Discharge status: Home / Self Care | Payer: Medicare Other | Attending: Emergency Medicine | Admitting: Emergency Medicine

## 2013-12-04 DIAGNOSIS — G4730 Sleep apnea, unspecified: Secondary | ICD-10-CM | POA: Insufficient documentation

## 2013-12-04 DIAGNOSIS — G40909 Epilepsy, unspecified, not intractable, without status epilepticus: Secondary | ICD-10-CM | POA: Insufficient documentation

## 2013-12-04 DIAGNOSIS — Z8673 Personal history of transient ischemic attack (TIA), and cerebral infarction without residual deficits: Secondary | ICD-10-CM | POA: Insufficient documentation

## 2013-12-04 DIAGNOSIS — I2510 Atherosclerotic heart disease of native coronary artery without angina pectoris: Secondary | ICD-10-CM | POA: Insufficient documentation

## 2013-12-04 DIAGNOSIS — K219 Gastro-esophageal reflux disease without esophagitis: Secondary | ICD-10-CM | POA: Insufficient documentation

## 2013-12-04 DIAGNOSIS — M129 Arthropathy, unspecified: Secondary | ICD-10-CM | POA: Insufficient documentation

## 2013-12-04 DIAGNOSIS — F411 Generalized anxiety disorder: Secondary | ICD-10-CM | POA: Insufficient documentation

## 2013-12-04 DIAGNOSIS — Z8669 Personal history of other diseases of the nervous system and sense organs: Secondary | ICD-10-CM | POA: Insufficient documentation

## 2013-12-04 DIAGNOSIS — R079 Chest pain, unspecified: Secondary | ICD-10-CM | POA: Insufficient documentation

## 2013-12-04 DIAGNOSIS — IMO0002 Reserved for concepts with insufficient information to code with codable children: Secondary | ICD-10-CM | POA: Insufficient documentation

## 2013-12-04 DIAGNOSIS — Z79899 Other long term (current) drug therapy: Secondary | ICD-10-CM | POA: Insufficient documentation

## 2013-12-04 DIAGNOSIS — Z87891 Personal history of nicotine dependence: Secondary | ICD-10-CM | POA: Insufficient documentation

## 2013-12-04 DIAGNOSIS — I10 Essential (primary) hypertension: Secondary | ICD-10-CM | POA: Insufficient documentation

## 2013-12-04 DIAGNOSIS — Z7982 Long term (current) use of aspirin: Secondary | ICD-10-CM | POA: Insufficient documentation

## 2013-12-04 LAB — CBC
HEMATOCRIT: 39 % (ref 39.0–52.0)
HEMOGLOBIN: 13.2 g/dL (ref 13.0–17.0)
MCH: 28.8 pg (ref 26.0–34.0)
MCHC: 33.8 g/dL (ref 30.0–36.0)
MCV: 85 fL (ref 78.0–100.0)
Platelets: 204 10*3/uL (ref 150–400)
RBC: 4.59 MIL/uL (ref 4.22–5.81)
RDW: 14.1 % (ref 11.5–15.5)
WBC: 6 10*3/uL (ref 4.0–10.5)

## 2013-12-04 LAB — BASIC METABOLIC PANEL
BUN: 13 mg/dL (ref 6–23)
CHLORIDE: 102 meq/L (ref 96–112)
CO2: 27 meq/L (ref 19–32)
Calcium: 9.3 mg/dL (ref 8.4–10.5)
Creatinine, Ser: 1 mg/dL (ref 0.50–1.35)
GFR calc Af Amer: 82 mL/min — ABNORMAL LOW (ref >90)
GFR calc non Af Amer: 71 mL/min — ABNORMAL LOW (ref >90)
Glucose, Bld: 110 mg/dL — ABNORMAL HIGH (ref 70–99)
Potassium: 3.8 mEq/L (ref 3.7–5.3)
Sodium: 141 mEq/L (ref 137–147)

## 2013-12-04 LAB — TROPONIN I: Troponin I: <0.3 ng/mL (ref <0.30)

## 2013-12-04 LAB — PRO B NATRIURETIC PEPTIDE: PRO B NATRI PEPTIDE: 56.4 pg/mL (ref 0–450)

## 2013-12-04 LAB — I-STAT TROPONIN, ED: Troponin i, poc: 0 ng/mL (ref 0.00–0.08)

## 2013-12-04 MED ORDER — HYDROMORPHONE HCL PF 1 MG/ML IJ SOLN
1.0000 mg | Freq: Once | INTRAMUSCULAR | Status: AC
  Administered 2013-12-04: 1 mg via INTRAVENOUS
  Filled 2013-12-04: qty 1

## 2013-12-04 MED ORDER — KETOROLAC TROMETHAMINE 15 MG/ML IJ SOLN
15.0000 mg | Freq: Once | INTRAMUSCULAR | Status: AC
  Administered 2013-12-04: 15 mg via INTRAVENOUS
  Filled 2013-12-04: qty 1

## 2013-12-04 MED ORDER — NITROGLYCERIN 0.4 MG SL SUBL
SUBLINGUAL_TABLET | SUBLINGUAL | Status: AC
  Filled 2013-12-04: qty 1

## 2013-12-04 MED ORDER — NITROGLYCERIN 0.4 MG SL SUBL
0.4000 mg | SUBLINGUAL_TABLET | SUBLINGUAL | Status: DC | PRN
  Administered 2013-12-04: 0.4 mg via SUBLINGUAL

## 2013-12-04 NOTE — Discharge Instructions (Signed)
Chest Pain (Nonspecific) °It is often hard to give a specific diagnosis for the cause of chest pain. There is always a chance that your pain could be related to something serious, such as a heart attack or a blood clot in the lungs. You need to follow up with your caregiver for further evaluation. °CAUSES  °· Heartburn. °· Pneumonia or bronchitis. °· Anxiety or stress. °· Inflammation around your heart (pericarditis) or lung (pleuritis or pleurisy). °· A blood clot in the lung. °· A collapsed lung (pneumothorax). It can develop suddenly on its own (spontaneous pneumothorax) or from injury (trauma) to the chest. °· Shingles infection (herpes zoster virus). °The chest wall is composed of bones, muscles, and cartilage. Any of these can be the source of the pain. °· The bones can be bruised by injury. °· The muscles or cartilage can be strained by coughing or overwork. °· The cartilage can be affected by inflammation and become sore (costochondritis). °DIAGNOSIS  °Lab tests or other studies, such as X-rays, electrocardiography, stress testing, or cardiac imaging, may be needed to find the cause of your pain.  °TREATMENT  °· Treatment depends on what may be causing your chest pain. Treatment may include: °· Acid blockers for heartburn. °· Anti-inflammatory medicine. °· Pain medicine for inflammatory conditions. °· Antibiotics if an infection is present. °· You may be advised to change lifestyle habits. This includes stopping smoking and avoiding alcohol, caffeine, and chocolate. °· You may be advised to keep your head raised (elevated) when sleeping. This reduces the chance of acid going backward from your stomach into your esophagus. °· Most of the time, nonspecific chest pain will improve within 2 to 3 days with rest and mild pain medicine. °HOME CARE INSTRUCTIONS  °· If antibiotics were prescribed, take your antibiotics as directed. Finish them even if you start to feel better. °· For the next few days, avoid physical  activities that bring on chest pain. Continue physical activities as directed. °· Do not smoke. °· Avoid drinking alcohol. °· Only take over-the-counter or prescription medicine for pain, discomfort, or fever as directed by your caregiver. °· Follow your caregiver's suggestions for further testing if your chest pain does not go away. °· Keep any follow-up appointments you made. If you do not go to an appointment, you could develop lasting (chronic) problems with pain. If there is any problem keeping an appointment, you must call to reschedule. °SEEK MEDICAL CARE IF:  °· You think you are having problems from the medicine you are taking. Read your medicine instructions carefully. °· Your chest pain does not go away, even after treatment. °· You develop a rash with blisters on your chest. °SEEK IMMEDIATE MEDICAL CARE IF:  °· You have increased chest pain or pain that spreads to your arm, neck, jaw, back, or abdomen. °· You develop shortness of breath, an increasing cough, or you are coughing up blood. °· You have severe back or abdominal pain, feel nauseous, or vomit. °· You develop severe weakness, fainting, or chills. °· You have a fever. °THIS IS AN EMERGENCY. Do not wait to see if the pain will go away. Get medical help at once. Call your local emergency services (911 in [place] Do not drive yourself to the hospital. °MAKE SURE YOU:  °· Understand these instructions. °· Will watch your condition. °· Will get help right away if you are not doing well or get worse. °Document Released: 04/09/2005 Document Revised: 09/22/2011 Document Reviewed: 02/03/2008 °ExitCare® Patient Information ©2014 [employer]   [employer]. ° °

## 2013-12-04 NOTE — ED Provider Notes (Signed)
2015 - Patient care assumed from Dr. Wilson Singer at shift change. Patient presents for left sided chest pain. Second troponin pending. Plan discussed with Dr. Wilson Singer which includes discharge with primary care follow up if second troponin negative.  2030 - Second troponin negative. Results reviewed with patient and questions answered. Patient stable for discharge. He was discharged from the ED in good condition with no unaddressed concerns.   Filed Vitals:   12/04/13 2015  BP: 160/88  Pulse: 77  Temp:   Resp: [id number] PA-C 12/04/13 2033

## 2013-12-04 NOTE — ED Notes (Signed)
Pt states that pain started yesterday. Pt states that he has chest tightness and pain in his legs. Pt states that pain is worse with movement and palpation

## 2013-12-04 NOTE — ED Provider Notes (Signed)
CSN: [id number]     Arrival date & time 12/04/13  1612 History   First MD Initiated Contact with Patient 12/04/13 1649     Chief Complaint  Patient presents with  . Chest Pain     (Consider location/radiation/quality/duration/timing/severity/associated sxs/prior Treatment) HPI  89y male with chest pain. Left anterior chest. Onset last night. Pain is sharp. Constant. No appreciable exacerbating relieving factors. Does not radiate. No associated symptoms such as dyspnea, diaphoresis, palpitations or nausea. Patient has not tried taking anything for his pain. No fevers or chills. No cough. No unusual leg pain or swelling. Patient reports a past history of coronary artery disease or previous MI many years ago. He is not sure what his previous symptoms exactly where.  "I think I had it in my sleep."  Past Medical History  Diagnosis Date  . Acid reflux   . Anxiety   . Sleep apnea   . Catalepsy   . Narcolepsy   . Sleep apnea   . Hypertension   . Seizures   . Arthritis   . Coronary artery disease   . Stroke 08/27/2013    [facility] notes 09/01/2013   Past Surgical History  Procedure Laterality Date  . Arm surgery     . Tonsillectomy    . Tee without cardioversion N/A 08/31/2013    Procedure: TRANSESOPHAGEAL ECHOCARDIOGRAM (TEE);  Surgeon: Thayer Headings, MD;  Location: [facility] ENDOSCOPY;  Service: Cardiovascular;  Laterality: N/A;  . Abdominal aortic aneurysm repair  02/2013    [facility] notes 09/01/2013  . Loop recorder implant  08/31/13    MDT LinQ implanted by Dr Lovena Le for cryptogenic stroke   No family history on file. History  Substance Use Topics  . Smoking status: Former Smoker    Types: Cigarettes    Quit date: 08/29/2013  . Smokeless tobacco: Not on file  . Alcohol Use: No    Review of Systems  All systems reviewed and negative, other than as noted in HPI.   Allergies  Review of patient's allergies indicates no known allergies.  Home Medications   Prior to Admission  medications   Medication Sig Start Date End Date Taking? Authorizing Provider  albuterol (PROVENTIL HFA;VENTOLIN HFA) 108 (90 BASE) MCG/ACT inhaler Inhale 2 puffs into the lungs every 6 (six) hours as needed for wheezing or shortness of breath.   Yes Historical Provider, MD  aspirin 325 MG tablet Take 1 tablet (325 mg total) by mouth daily. 09/01/13  Yes Velvet Bathe, MD  atorvastatin (LIPITOR) 20 MG tablet Take 1 tablet (20 mg total) by mouth daily at 6 PM. 09/01/13  Yes Velvet Bathe, MD  bisacodyl (DULCOLAX) 5 MG EC tablet Take 5 mg by mouth daily as needed for moderate constipation.   Yes Historical Provider, MD  brimonidine (ALPHAGAN) 0.2 % ophthalmic solution Place 1 drop into the right eye 2 (two) times daily. 09/01/13  Yes Velvet Bathe, MD  cetirizine (ZYRTEC) 10 MG tablet Take 10 mg by mouth daily as needed for allergies.   Yes Historical Provider, MD  lisinopril (PRINIVIL,ZESTRIL) 40 MG tablet Take 20 mg by mouth daily.   Yes Historical Provider, MD  methylphenidate (RITALIN) 10 MG tablet Take 10 mg by mouth 3 (three) times daily with meals.    Yes Historical Provider, MD  omeprazole (PRILOSEC) 20 MG capsule Take 20 mg by mouth daily.   Yes Historical Provider, MD  tamsulosin (FLOMAX) 0.4 MG CAPS capsule Take 0.4 mg by mouth at bedtime.   Yes Historical  Provider, MD  triamcinolone ointment (KENALOG) 0.5 % Apply 1 application topically 2 (two) times daily. Apply to affected area   Yes Historical Provider, MD  venlafaxine XR (EFFEXOR-XR) 150 MG 24 hr capsule Take 150 mg by mouth 2 (two) times daily.   Yes Historical Provider, MD  venlafaxine XR (EFFEXOR-XR) 37.5 MG 24 hr capsule Take 37.5 mg by mouth every evening.    Yes Historical Provider, MD  vitamin C (ASCORBIC ACID) 500 MG tablet Take 500 mg by mouth daily.   Yes Historical Provider, MD  nitroGLYCERIN (NITROSTAT) 0.4 MG SL tablet Place 0.4 mg under the tongue every 5 (five) minutes as needed for chest pain.    Historical Provider, MD   BP  180/95  Pulse 70  Temp(Src) 97.8 F (36.6 C) (Oral)  Resp 26  Ht 6' (1.829 m)  Wt 240 lb (108.863 kg)  BMI 32.54 kg/m2  SpO2 92% Physical Exam  Nursing note and vitals reviewed. Constitutional: He appears well-developed and well-nourished. No distress.  HENT:  Head: Normocephalic and atraumatic.  Eyes: Conjunctivae are normal. Right eye exhibits no discharge. Left eye exhibits no discharge.  Neck: Neck supple.  Cardiovascular: Normal rate, regular rhythm and normal heart sounds.  Exam reveals no gallop and no friction rub.   No murmur heard. Pulmonary/Chest: Effort normal and breath sounds normal. No respiratory distress.  Loop recorder palpated a left parasternal border  Abdominal: Soft. He exhibits no distension. There is no tenderness.  Musculoskeletal: He exhibits no edema and no tenderness.  Lower extremities symmetric as compared to each other. No calf tenderness. Negative Homan's. No palpable cords.   Neurological: He is alert.  Skin: Skin is warm and dry.  Psychiatric: He has a normal mood and affect. His behavior is normal. Thought content normal.    ED Course  Procedures (including critical care time) Labs Review Labs Reviewed  BASIC METABOLIC PANEL - Abnormal; Notable for the following:    Glucose, Bld 110 (*)    GFR calc non Af Amer 71 (*)    GFR calc Af Amer 82 (*)    All other components within normal limits  CBC  PRO B NATRIURETIC PEPTIDE  TROPONIN I  Randolm Idol, ED    Imaging Review Dg Chest Port 1 View  12/04/2013   CLINICAL DATA:  Cough, chest pain  EXAM: PORTABLE CHEST - 1 VIEW  COMPARISON:  02/12/2012  FINDINGS: Cardiomediastinal silhouette is stable. No acute infiltrate or pleural effusion. No pulmonary edema. Mild basilar atelectasis or scarring.  IMPRESSION: No infiltrate or pulmonary edema. Mild basilar atelectasis or scarring.   Electronically Signed   By: Lahoma Crocker M.D.   On: 12/04/2013 16:54     EKG Interpretation   Date/Time:   Sunday Dec 04 2013 16:12:44 EDT Ventricular Rate:  84 PR Interval:  136 QRS Duration: 104 QT Interval:  386 QTC Calculation: 456 R Axis:   25 Text Interpretation:  Normal sinus rhythm Nonspecific ST and T wave  abnormality No old tracing to compare Confirmed by Agenda  MD, Daianna Vasques  ([id number]) on 12/04/2013 7:09:53 PM      MDM   Final diagnoses:  Chest pain    78 rolled male with chest pain. Atypical for ACS given constant nature since last night. Nonreproducible on exam. Patient currently has an implantable loop recorder was placed after a cryptogenic stroke earlier this year. Per review of records, this has not showed any significant abnormalities since was implanted. Echocardiogram was done as part of  his stroke workup and essentially normal. Trivial aortic regurgitation and ejection fraction 60-65%. Patient reports past history of coronary artery disease and previous MI. Patient states that this is several decades ago. When asked if he has a cardiologist, he cited the name of the hospital that was on the discharge summary from his admission for stroke. This EKG is not completely normal, but unfortunately I do not have an old one to compare. I cannot locate one in the computer from this past admission for central retinal artery occlusion. Serial EKGs in emergency room today remain unchanged. Initial troponin is normal. Orthopedist second set of enzymes. If this remains normal as well I feel he can be discharged for further followup as an outpatient. Doubt infectious process. Doubt pulmonary embolism or dissection.   Virgel Manifold, MD 12/04/13 2013

## 2013-12-04 NOTE — ED Notes (Signed)
Ptr. O2 sat 93% on RA.

## 2013-12-05 NOTE — ED Provider Notes (Signed)
Medical screening examination/treatment/procedure(s) were performed by non-physician practitioner and as supervising physician I was immediately available for consultation/collaboration.   EKG Interpretation   Date/Time:  Sunday Dec 04 2013 16:12:44 EDT Ventricular Rate:  84 PR Interval:  136 QRS Duration: 104 QT Interval:  386 QTC Calculation: 456 R Axis:   25 Text Interpretation:  Normal sinus rhythm Nonspecific ST and T wave  abnormality No old tracing to compare Confirmed by Wilson Singer  MD, [place]  [phone]) on 12/04/2013 7:09:53 PM       Jasper Riling. Alvino Chapel, MD 12/05/13 1450

## 2013-12-30 ENCOUNTER — Ambulatory Visit (INDEPENDENT_AMBULATORY_CARE_PROVIDER_SITE_OTHER): Payer: Medicare Other | Admitting: *Deleted

## 2013-12-30 DIAGNOSIS — I6350 Cerebral infarction due to unspecified occlusion or stenosis of unspecified cerebral artery: Secondary | ICD-10-CM

## 2013-12-30 DIAGNOSIS — I639 Cerebral infarction, unspecified: Secondary | ICD-10-CM

## 2013-12-30 LAB — MDC_IDC_ENUM_SESS_TYPE_REMOTE

## 2014-01-06 NOTE — Progress Notes (Signed)
Loop recorder 

## 2014-02-02 ENCOUNTER — Ambulatory Visit (INDEPENDENT_AMBULATORY_CARE_PROVIDER_SITE_OTHER): Payer: Medicare Other | Admitting: *Deleted

## 2014-02-02 DIAGNOSIS — I6350 Cerebral infarction due to unspecified occlusion or stenosis of unspecified cerebral artery: Secondary | ICD-10-CM

## 2014-02-02 DIAGNOSIS — I639 Cerebral infarction, unspecified: Secondary | ICD-10-CM

## 2014-02-02 LAB — MDC_IDC_ENUM_SESS_TYPE_REMOTE

## 2014-02-08 NOTE — Progress Notes (Signed)
Loop recorder 

## 2014-03-03 ENCOUNTER — Ambulatory Visit (INDEPENDENT_AMBULATORY_CARE_PROVIDER_SITE_OTHER): Payer: Medicare Other | Admitting: *Deleted

## 2014-03-03 DIAGNOSIS — I6350 Cerebral infarction due to unspecified occlusion or stenosis of unspecified cerebral artery: Secondary | ICD-10-CM

## 2014-03-03 DIAGNOSIS — I639 Cerebral infarction, unspecified: Secondary | ICD-10-CM

## 2014-03-09 NOTE — Progress Notes (Signed)
Loop recorder 

## 2014-03-23 ENCOUNTER — Encounter: Payer: Self-pay | Admitting: Internal Medicine

## 2014-03-28 LAB — MDC_IDC_ENUM_SESS_TYPE_REMOTE

## 2014-04-03 ENCOUNTER — Ambulatory Visit (INDEPENDENT_AMBULATORY_CARE_PROVIDER_SITE_OTHER): Payer: Medicare Other | Admitting: *Deleted

## 2014-04-03 DIAGNOSIS — I6350 Cerebral infarction due to unspecified occlusion or stenosis of unspecified cerebral artery: Secondary | ICD-10-CM

## 2014-04-03 DIAGNOSIS — I639 Cerebral infarction, unspecified: Secondary | ICD-10-CM

## 2014-04-06 ENCOUNTER — Encounter: Payer: Self-pay | Admitting: Internal Medicine

## 2014-04-07 ENCOUNTER — Encounter: Payer: Self-pay | Admitting: Internal Medicine

## 2014-04-11 ENCOUNTER — Telehealth: Payer: Self-pay | Admitting: *Deleted

## 2014-04-11 LAB — MDC_IDC_ENUM_SESS_TYPE_REMOTE

## 2014-04-11 NOTE — Telephone Encounter (Signed)
LMOVM in regards to 2 pause episodes--9-2 at 1235 am and 9-20 at 1225 pm. ? Symptoms.

## 2014-04-21 NOTE — Progress Notes (Signed)
Loop recorder 

## 2014-04-24 ENCOUNTER — Encounter: Payer: Self-pay | Admitting: Internal Medicine

## 2014-05-03 ENCOUNTER — Ambulatory Visit (INDEPENDENT_AMBULATORY_CARE_PROVIDER_SITE_OTHER): Payer: Medicare Other | Admitting: *Deleted

## 2014-05-03 DIAGNOSIS — I639 Cerebral infarction, unspecified: Secondary | ICD-10-CM

## 2014-05-03 LAB — MDC_IDC_ENUM_SESS_TYPE_REMOTE

## 2014-05-12 NOTE — Progress Notes (Signed)
Loop recorder 

## 2014-06-02 ENCOUNTER — Ambulatory Visit (INDEPENDENT_AMBULATORY_CARE_PROVIDER_SITE_OTHER): Payer: Medicare Other | Admitting: *Deleted

## 2014-06-02 DIAGNOSIS — I639 Cerebral infarction, unspecified: Secondary | ICD-10-CM

## 2014-06-02 LAB — MDC_IDC_ENUM_SESS_TYPE_REMOTE
Date Time Interrogation Session: 20151128165357
MDC IDC SET ZONE DETECTION INTERVAL: 2000 ms
Zone Setting Detection Interval: 3000 ms
Zone Setting Detection Interval: 390 ms

## 2014-06-06 NOTE — Progress Notes (Signed)
Loop recorder 

## 2014-06-22 ENCOUNTER — Encounter: Payer: Self-pay | Admitting: Internal Medicine

## 2014-07-03 ENCOUNTER — Ambulatory Visit (INDEPENDENT_AMBULATORY_CARE_PROVIDER_SITE_OTHER): Payer: Medicare Other | Admitting: *Deleted

## 2014-07-03 DIAGNOSIS — I639 Cerebral infarction, unspecified: Secondary | ICD-10-CM

## 2014-07-04 NOTE — Progress Notes (Signed)
Loop recorder 

## 2014-07-11 ENCOUNTER — Encounter: Payer: Self-pay | Admitting: Internal Medicine

## 2014-07-25 LAB — MDC_IDC_ENUM_SESS_TYPE_REMOTE

## 2014-07-28 ENCOUNTER — Ambulatory Visit (INDEPENDENT_AMBULATORY_CARE_PROVIDER_SITE_OTHER): Payer: Medicare Other | Admitting: *Deleted

## 2014-07-28 DIAGNOSIS — I639 Cerebral infarction, unspecified: Secondary | ICD-10-CM | POA: Diagnosis not present

## 2014-07-28 LAB — MDC_IDC_ENUM_SESS_TYPE_REMOTE

## 2014-08-02 NOTE — Progress Notes (Signed)
Loop recorder 

## 2014-08-14 ENCOUNTER — Encounter: Payer: Self-pay | Admitting: Internal Medicine

## 2014-08-30 ENCOUNTER — Encounter: Payer: Self-pay | Admitting: Internal Medicine

## 2014-08-31 ENCOUNTER — Ambulatory Visit (INDEPENDENT_AMBULATORY_CARE_PROVIDER_SITE_OTHER): Payer: Medicare Other | Admitting: *Deleted

## 2014-08-31 DIAGNOSIS — I639 Cerebral infarction, unspecified: Secondary | ICD-10-CM

## 2014-08-31 LAB — MDC_IDC_ENUM_SESS_TYPE_REMOTE

## 2014-09-01 NOTE — Progress Notes (Signed)
Loop recorder 

## 2014-09-05 ENCOUNTER — Encounter: Payer: Self-pay | Admitting: Internal Medicine

## 2014-09-05 ENCOUNTER — Ambulatory Visit (INDEPENDENT_AMBULATORY_CARE_PROVIDER_SITE_OTHER): Payer: Medicare Other | Admitting: Internal Medicine

## 2014-09-05 VITALS — BP 132/90 | HR 65 | Ht 72.0 in | Wt 264.4 lb

## 2014-09-05 DIAGNOSIS — I10 Essential (primary) hypertension: Secondary | ICD-10-CM

## 2014-09-05 DIAGNOSIS — H3411 Central retinal artery occlusion, right eye: Secondary | ICD-10-CM | POA: Diagnosis not present

## 2014-09-05 DIAGNOSIS — I161 Hypertensive emergency: Secondary | ICD-10-CM

## 2014-09-05 DIAGNOSIS — Z720 Tobacco use: Secondary | ICD-10-CM

## 2014-09-05 LAB — MDC_IDC_ENUM_SESS_TYPE_INCLINIC

## 2014-09-05 NOTE — Assessment & Plan Note (Signed)
His blood pressure is now only minimally elevated. He'll continue his current medical therapy. I've encouraged the patient to stop smoking and reduce his salt intake.

## 2014-09-05 NOTE — Patient Instructions (Addendum)
Your physician wants you to follow-up in: 6 months with Dr Taylor You will receive a reminder letter in the mail two months in advance. If you don't receive a letter, please call our office to schedule the follow-up appointment.  

## 2014-09-05 NOTE — Assessment & Plan Note (Signed)
I discussed the importance of smoking cessation. I've asked the patient to stop smoking. At the very least, I've asked him to reduce his cigarette consumption by half to one pack a day. He states that he will try to do this.

## 2014-09-05 NOTE — Progress Notes (Signed)
HPI [name] returns today for follow-up. He has a history of HTN, s/p stroke, s/p ILR, COPD, very sedentary lifestyle, and DM. The patient has been very sedentary in recent weeks. He smokes almost 2 packs of cigarettes a day. His appetite is still good. No Known Allergies   Current Outpatient Prescriptions  Medication Sig Dispense Refill  . lisinopril (PRINIVIL,ZESTRIL) 40 MG tablet Take 20 mg by mouth daily.    . methylphenidate (RITALIN) 10 MG tablet Take 10 mg by mouth 3 (three) times daily with meals. Unknown prn, pt stated he takes when he needs it for seziures    . nitroGLYCERIN (NITROSTAT) 0.4 MG SL tablet Place 0.4 mg under the tongue every 5 (five) minutes as needed for chest pain.    . tamsulosin (FLOMAX) 0.4 MG CAPS capsule Take 0.4 mg by mouth at bedtime.    Marland Kitchen venlafaxine XR (EFFEXOR-XR) 150 MG 24 hr capsule Take 150 mg by mouth 2 (two) times daily.    Marland Kitchen venlafaxine XR (EFFEXOR-XR) 37.5 MG 24 hr capsule Take 37.5 mg by mouth every evening.     Marland Kitchen albuterol (PROVENTIL HFA;VENTOLIN HFA) 108 (90 BASE) MCG/ACT inhaler Inhale 2 puffs into the lungs every 6 (six) hours as needed for wheezing or shortness of breath.    Marland Kitchen aspirin 325 MG tablet Take 1 tablet (325 mg total) by mouth daily. (Patient not taking: Reported on 09/05/2014) 30 tablet 0  . bisacodyl (DULCOLAX) 5 MG EC tablet Take 5 mg by mouth daily as needed for moderate constipation.    . cetirizine (ZYRTEC) 10 MG tablet Take 10 mg by mouth daily as needed for allergies.    Marland Kitchen omeprazole (PRILOSEC) 20 MG capsule Take 20 mg by mouth daily.    Marland Kitchen triamcinolone ointment (KENALOG) 0.5 % Apply 1 application topically 2 (two) times daily. Apply to affected area    . vitamin C (ASCORBIC ACID) 500 MG tablet Take 500 mg by mouth daily.     No current facility-administered medications for this visit.     Past Medical History  Diagnosis Date  . Acid reflux   . Anxiety   . Sleep apnea   . Catalepsy   . Narcolepsy   . Sleep  apnea   . Hypertension   . Seizures   . Arthritis   . Coronary artery disease   . Stroke 08/27/2013    [facility] notes 09/01/2013    ROS:   All systems reviewed and negative except as noted in the HPI.   Past Surgical History  Procedure Laterality Date  . Arm surgery     . Tonsillectomy    . Tee without cardioversion N/A 08/31/2013    Procedure: TRANSESOPHAGEAL ECHOCARDIOGRAM (TEE);  Surgeon: Thayer Headings, MD;  Location: [facility] ENDOSCOPY;  Service: Cardiovascular;  Laterality: N/A;  . Abdominal aortic aneurysm repair  02/2013    [facility] notes 09/01/2013  . Loop recorder implant  08/31/13    MDT LinQ implanted by Dr Lovena Le for cryptogenic stroke     Family History  Problem Relation Age of Onset  . Heart disease Mother   . Heart failure Father   . Heart disease Father      History   Social History  . Marital Status: Single    Spouse Name: N/A  . Number of Children: N/A  . Years of Education: N/A   Occupational History  . Not on file.   Social History Main Topics  . Smoking status: Current Every  Day Smoker    Types: Cigarettes    Last Attempt to Quit: 08/29/2013  . Smokeless tobacco: Not on file  . Alcohol Use: No  . Drug Use: No  . Sexual Activity: Not on file   Other Topics Concern  . Not on file   Social History Narrative     BP 132/90 mmHg  Pulse 65  Ht 6' (1.829 m)  Wt 264 lb 6.4 oz (119.931 kg)  BMI 35.85 kg/m2  Physical Exam:  Chronically ill appearing 80 year old man, NAD HEENT: Unremarkable Neck:  No JVD, no thyromegally Back:  No CVA tenderness Lungs:  Clear with no wheezes, rales, or rhonchi. HEART:  Regular rate rhythm, no murmurs, no rubs, no clicks Abd:  soft, positive bowel sounds, no organomegally, no rebound, no guarding Ext:  2 plus pulses, no edema, no cyanosis, no clubbing Skin:  No rashes no nodules Neuro:  CN II through XII intact, motor grossly intact   DEVICE  Normal ILR function.  See PaceArt for details.    Assess/Plan:

## 2014-09-05 NOTE — Assessment & Plan Note (Signed)
Interrogation of his loop recorder demonstrates no atrial fibrillation. He will undergo watchful waiting.

## 2014-09-22 ENCOUNTER — Encounter: Payer: Self-pay | Admitting: Internal Medicine

## 2014-09-26 ENCOUNTER — Encounter: Payer: Self-pay | Admitting: Internal Medicine

## 2014-10-02 ENCOUNTER — Ambulatory Visit (INDEPENDENT_AMBULATORY_CARE_PROVIDER_SITE_OTHER): Payer: Medicare Other | Admitting: *Deleted

## 2014-10-02 DIAGNOSIS — I639 Cerebral infarction, unspecified: Secondary | ICD-10-CM | POA: Diagnosis not present

## 2014-10-02 LAB — MDC_IDC_ENUM_SESS_TYPE_REMOTE

## 2014-10-06 NOTE — Progress Notes (Signed)
Loop recorder 

## 2014-10-18 IMAGING — CT CT HEAD W/O CM
1 series · 16 of 30 positions shown, 20 images · non-contrast
Comparison: CT HEAD W/O CM dated 02/12/2012; CT HEAD W/O CM dated
09/19/2011

CLINICAL DATA: Vision loss involving the right eye

EXAM:
CT HEAD WITHOUT CONTRAST
TECHNIQUE: Contiguous axial images were obtained from the base of the skull
through the vertex without intravenous contrast.

[Series 2: head 5.0 h30s · axial · 0.46mm/px · z∈[+548,+693]mm · 16 of 33 slices shown, 20 images]
[im 2/33  brain]
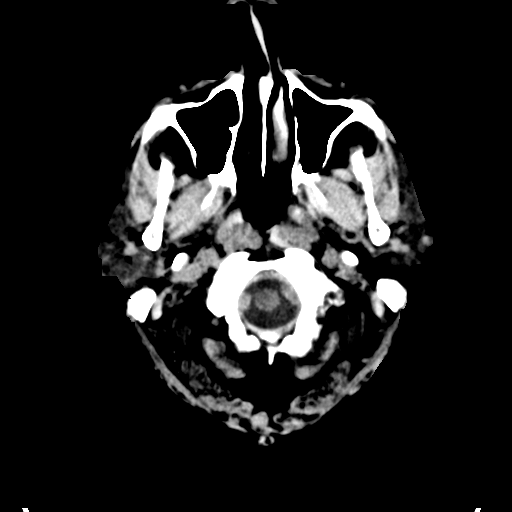
[im 2/33  bone]
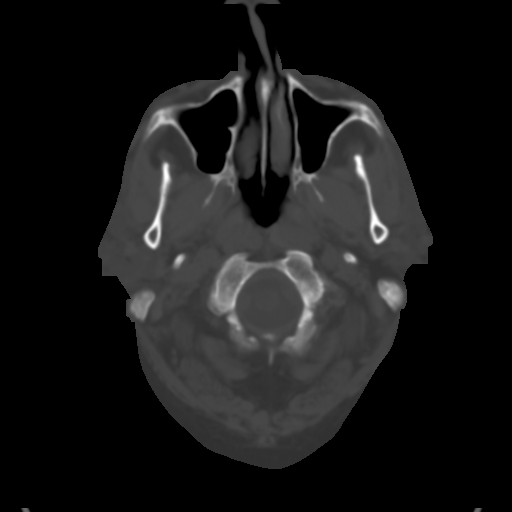
[im 4/33  brain]
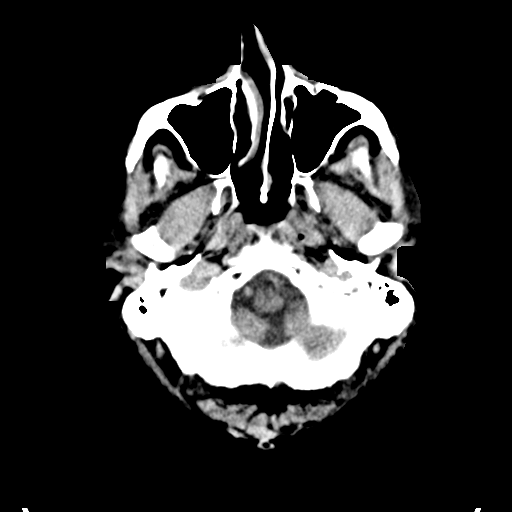
[im 6/33  brain]
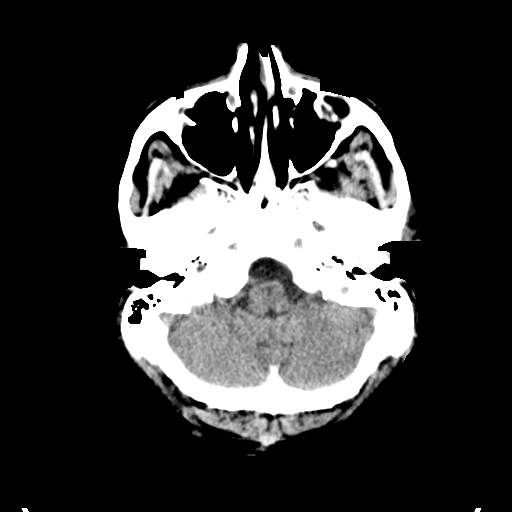
[im 8/33  brain]
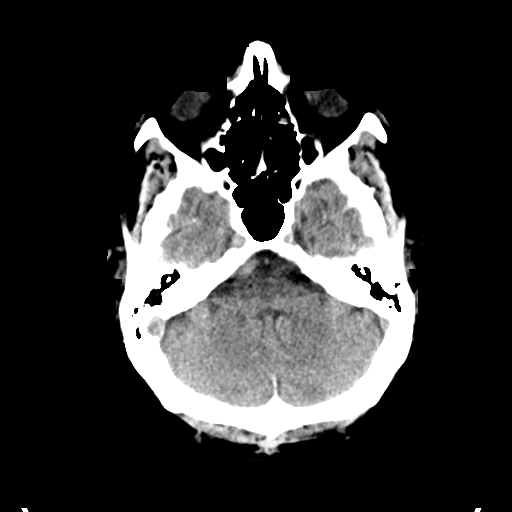
[im 9/33  brain]
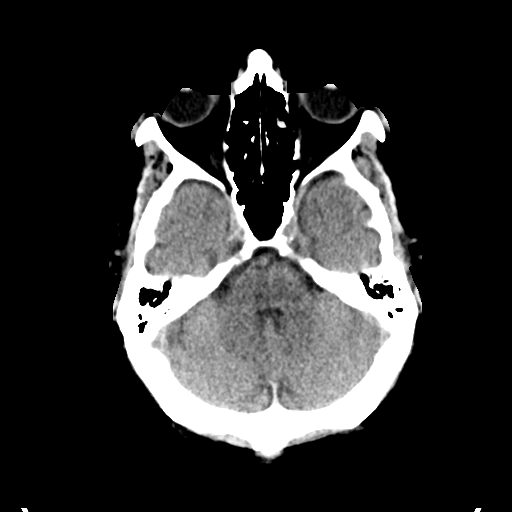
[im 9/33  bone]
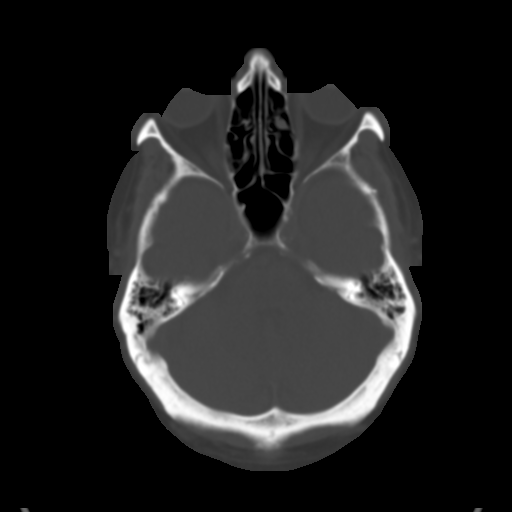
[im 12/33  brain]
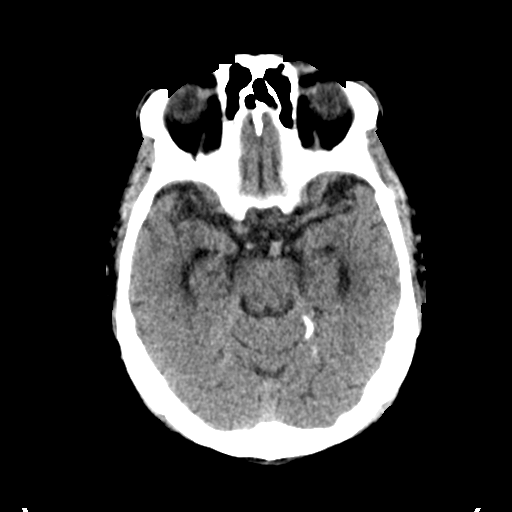
[im 14/33  brain]
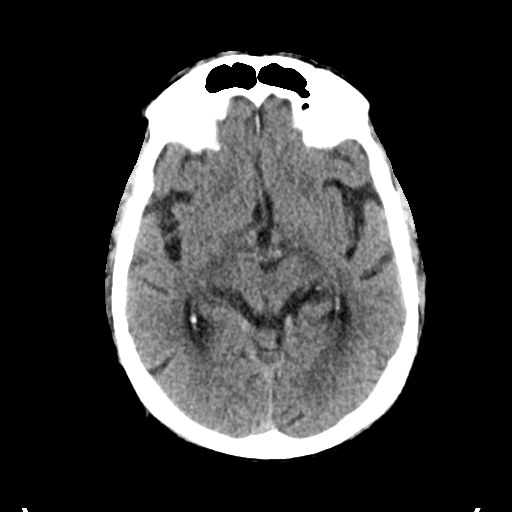
[im 16/33  brain]
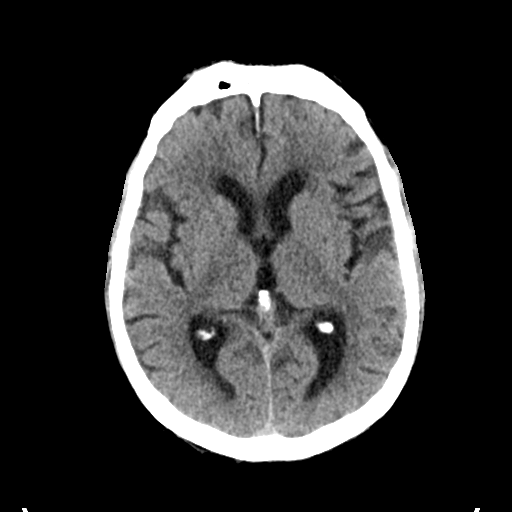
[im 17/33  brain]
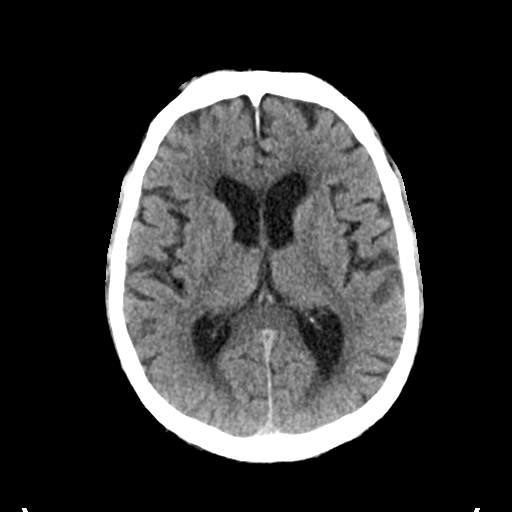
[im 17/33  bone]
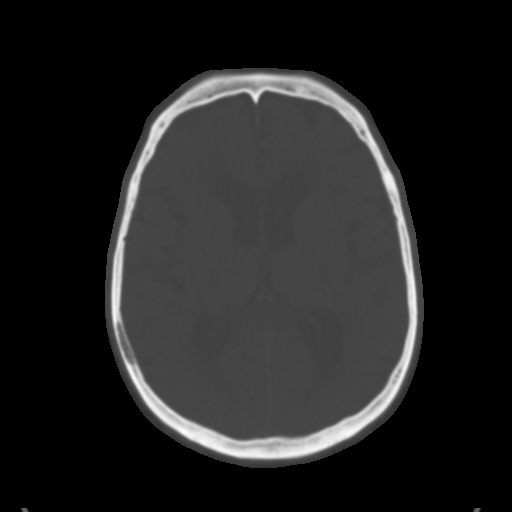
[im 19/33  brain]
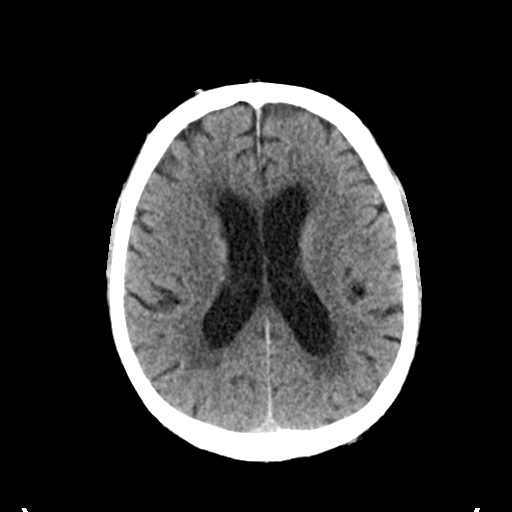
[im 21/33  brain]
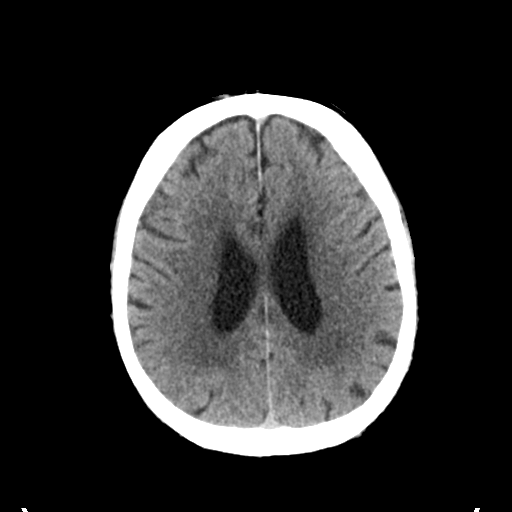
[im 24/33  brain]
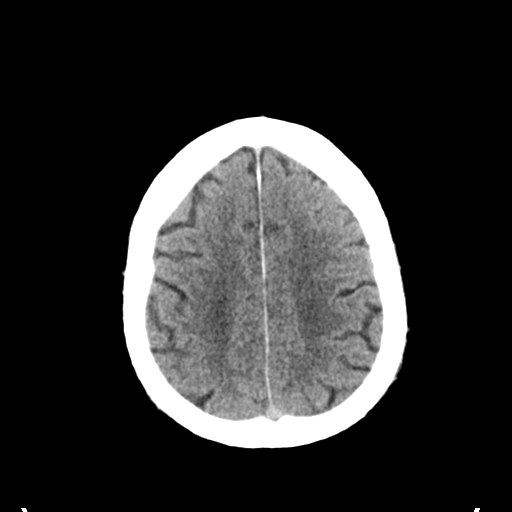
[im 25/33  brain]
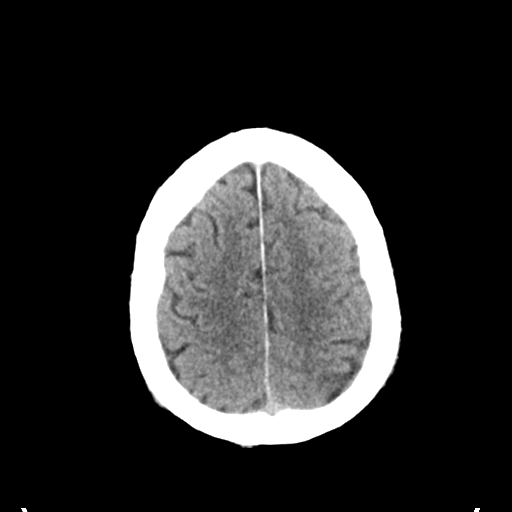
[im 25/33  bone]
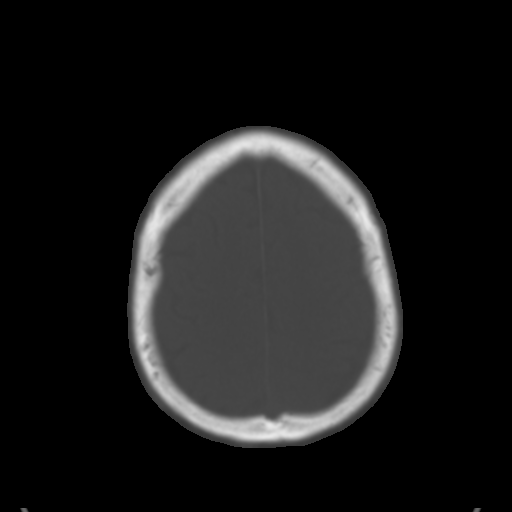
[im 27/33  brain]
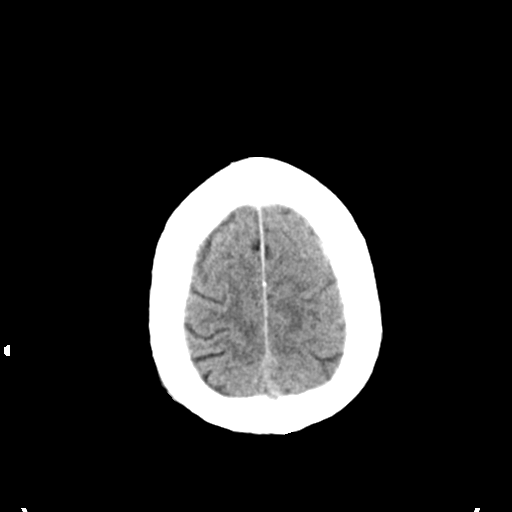
[im 29/33  brain]
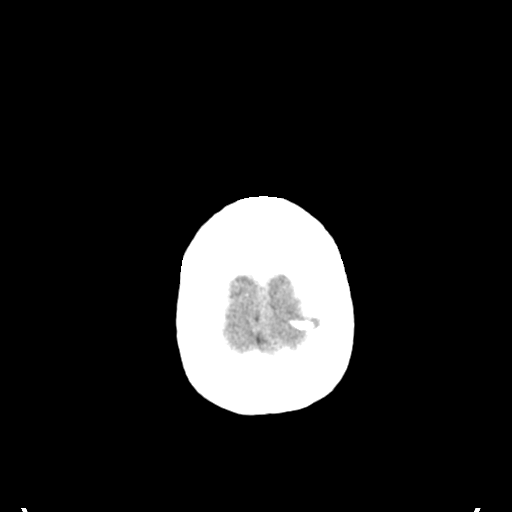
[im 31/33  brain]
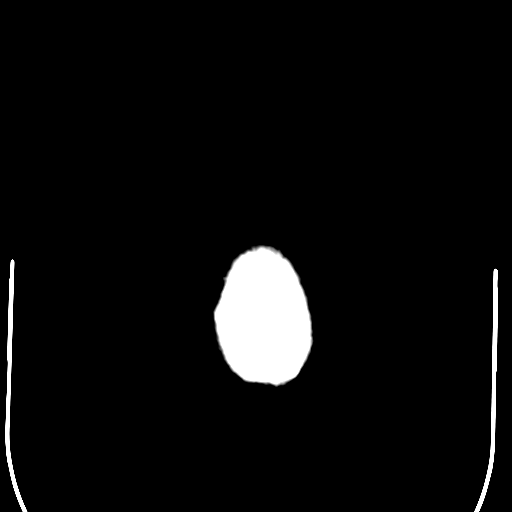

[16 of 30 positions shown; findings below may reference images not displayed]

FINDINGS: Mild atrophy with sulcal prominence. Scattered periventricular
hypodensities compatible of microvascular ischemic disease. Given
background parenchymal abnormalities, there is no CT evidence of
acute large territory infarct. No intraparenchymal or extra-axial
mass or hemorrhage. Unchanged size and configuration of the
ventricles and basilar cisterns. No midline shift. Intracranial
atherosclerosis. Limited visualization of the paranasal sinuses and
mastoid air cells is normal. Regional soft tissues appears normal.
No displaced calvarial fracture.
IMPRESSION: Mild atrophy and microvascular ischemic disease without acute
intracranial process.

## 2014-10-31 ENCOUNTER — Ambulatory Visit: Payer: Medicare Other

## 2014-11-15 ENCOUNTER — Encounter: Payer: Self-pay | Admitting: Internal Medicine

## 2014-11-15 DIAGNOSIS — L570 Actinic keratosis: Secondary | ICD-10-CM | POA: Diagnosis not present

## 2014-11-15 DIAGNOSIS — L821 Other seborrheic keratosis: Secondary | ICD-10-CM | POA: Diagnosis not present

## 2014-11-23 ENCOUNTER — Encounter: Payer: Self-pay | Admitting: Cardiology

## 2014-11-28 LAB — CUP PACEART REMOTE DEVICE CHECK: MDC IDC SESS DTM: 20160517151935

## 2014-11-30 ENCOUNTER — Encounter: Payer: Self-pay | Admitting: Internal Medicine

## 2014-11-30 ENCOUNTER — Encounter: Payer: Self-pay | Admitting: Cardiology

## 2014-12-04 ENCOUNTER — Telehealth: Payer: Self-pay | Admitting: *Deleted

## 2014-12-04 NOTE — Telephone Encounter (Signed)
Pt had pause episode on 12-01-14 around 601am.  Spoke w/caretaker to question if pt had symptoms. Caretaker unable to answer questions and pt was asleep. Caretaker to speak with pt and return call.

## 2014-12-07 ENCOUNTER — Encounter: Payer: Self-pay | Admitting: Internal Medicine

## 2014-12-16 DIAGNOSIS — D485 Neoplasm of uncertain behavior of skin: Secondary | ICD-10-CM | POA: Diagnosis not present

## 2014-12-16 DIAGNOSIS — L918 Other hypertrophic disorders of the skin: Secondary | ICD-10-CM | POA: Diagnosis not present

## 2014-12-29 ENCOUNTER — Ambulatory Visit (INDEPENDENT_AMBULATORY_CARE_PROVIDER_SITE_OTHER): Payer: Medicare Other | Admitting: *Deleted

## 2014-12-29 ENCOUNTER — Encounter: Payer: Self-pay | Admitting: Internal Medicine

## 2014-12-29 DIAGNOSIS — I639 Cerebral infarction, unspecified: Secondary | ICD-10-CM

## 2015-01-01 ENCOUNTER — Encounter: Payer: Self-pay | Admitting: Internal Medicine

## 2015-01-01 NOTE — Progress Notes (Signed)
Loop recorder 

## 2015-01-08 LAB — CUP PACEART REMOTE DEVICE CHECK
Date Time Interrogation Session: 20160620161433
MDC IDC SET ZONE DETECTION INTERVAL: 390 ms
Zone Setting Detection Interval: 2000 ms
Zone Setting Detection Interval: 3000 ms

## 2015-01-17 ENCOUNTER — Telehealth: Payer: Self-pay | Admitting: *Deleted

## 2015-01-17 NOTE — Telephone Encounter (Signed)
Pt not light headed this week but was last week. Pt states he has spells that last about five seconds then fade away. States it's almost like holding breath until prior to syncope. Has had these symptoms for ~18yrs. ILR alert shows pauses on 7-2 & 7-1 lasting four seconds each. Episode on 7-1 was during sleep at 3:59am. Episode on 7-2 was at 17:39.

## 2015-01-23 ENCOUNTER — Encounter: Payer: Self-pay | Admitting: Internal Medicine

## 2015-01-25 IMAGING — CR DG CHEST 1V PORT
1 series · 1 of 1 positions shown · non-contrast
Comparison: 02/12/2012

CLINICAL DATA: Cough, chest pain

EXAM:
PORTABLE CHEST - 1 VIEW

[AP]
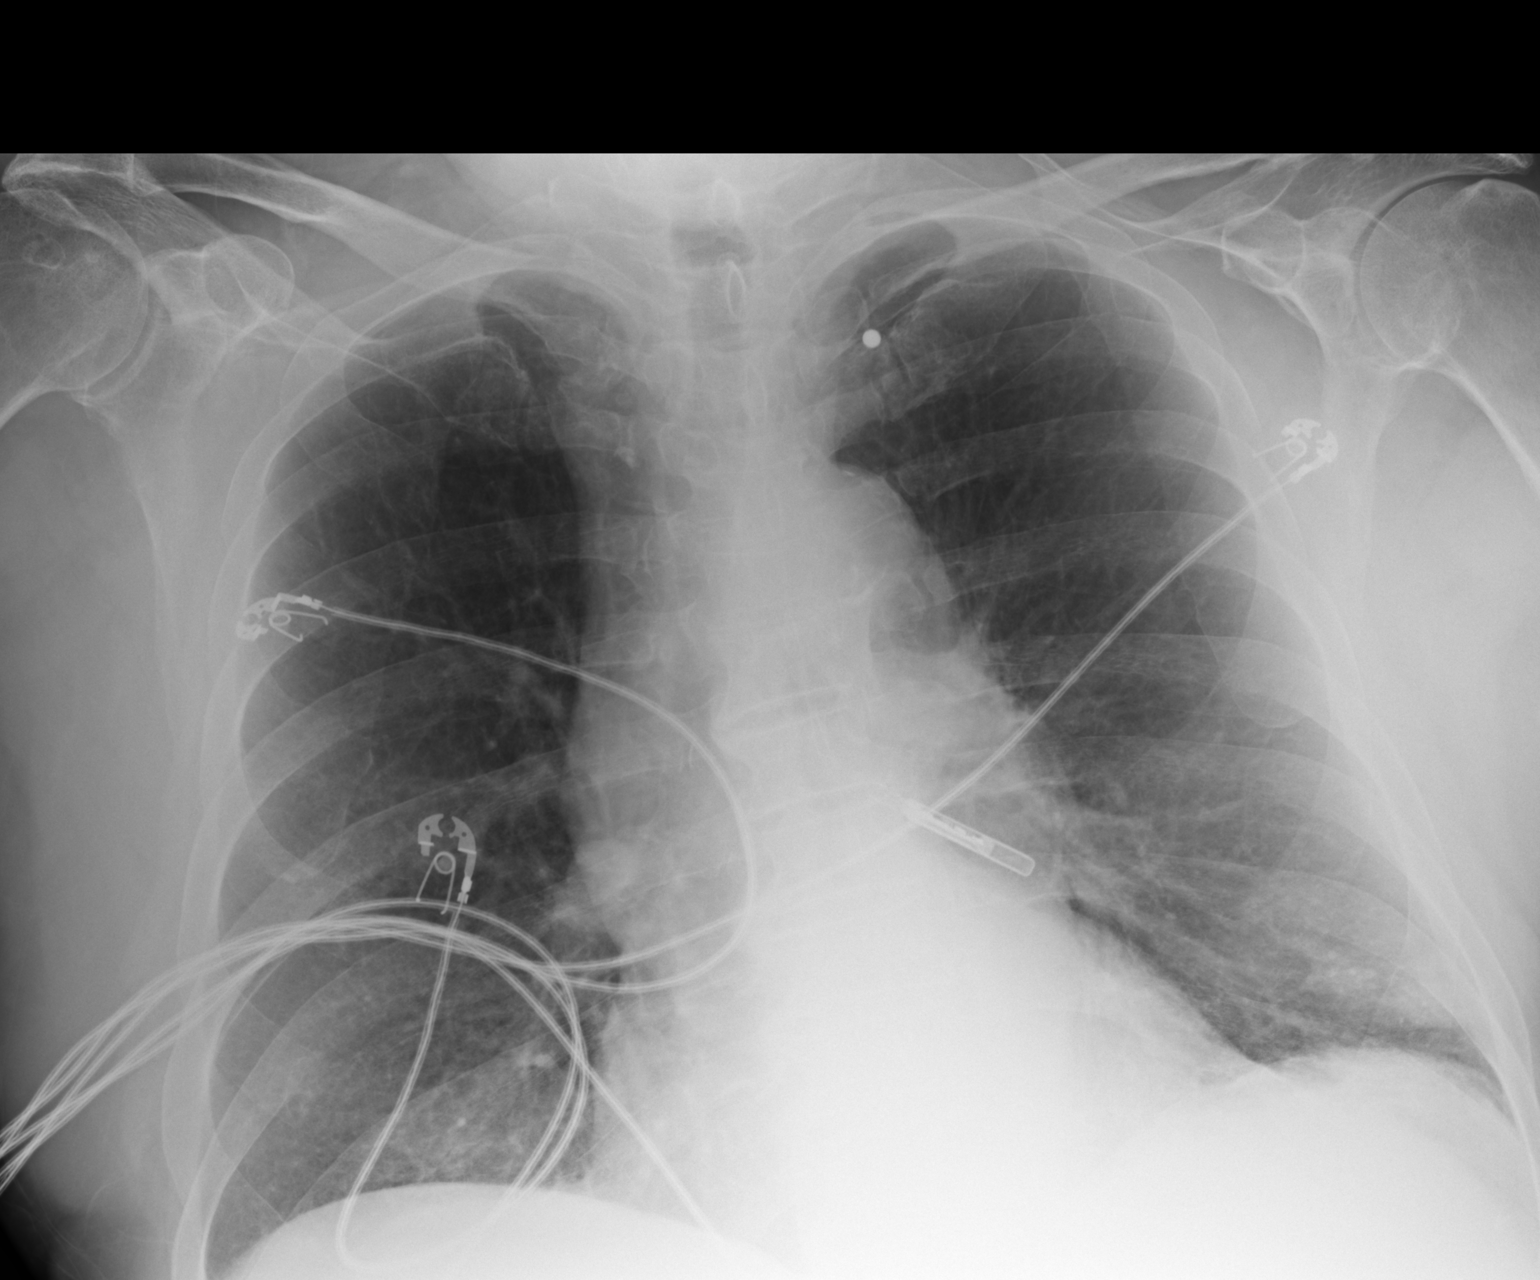

[1 of 1 positions shown; findings below may reference images not displayed]

FINDINGS: Cardiomediastinal silhouette is stable. No acute infiltrate or
pleural effusion. No pulmonary edema. Mild basilar atelectasis or
scarring.
IMPRESSION: No infiltrate or pulmonary edema. Mild basilar atelectasis or
scarring.

## 2015-01-29 ENCOUNTER — Ambulatory Visit (INDEPENDENT_AMBULATORY_CARE_PROVIDER_SITE_OTHER): Payer: Medicare Other | Admitting: *Deleted

## 2015-01-29 DIAGNOSIS — I639 Cerebral infarction, unspecified: Secondary | ICD-10-CM | POA: Diagnosis not present

## 2015-01-30 NOTE — Progress Notes (Signed)
Loop recorder 

## 2015-02-01 LAB — CUP PACEART REMOTE DEVICE CHECK
MDC IDC SESS DTM: 20160712174427
MDC IDC SET ZONE DETECTION INTERVAL: 2000 ms
Zone Setting Detection Interval: 3000 ms
Zone Setting Detection Interval: 390 ms

## 2015-02-15 ENCOUNTER — Encounter: Payer: Self-pay | Admitting: Internal Medicine

## 2015-02-15 DIAGNOSIS — J342 Deviated nasal septum: Secondary | ICD-10-CM | POA: Diagnosis not present

## 2015-02-15 DIAGNOSIS — H9313 Tinnitus, bilateral: Secondary | ICD-10-CM | POA: Diagnosis not present

## 2015-02-15 DIAGNOSIS — H9192 Unspecified hearing loss, left ear: Secondary | ICD-10-CM | POA: Diagnosis not present

## 2015-02-15 DIAGNOSIS — H6982 Other specified disorders of Eustachian tube, left ear: Secondary | ICD-10-CM | POA: Diagnosis not present

## 2015-02-16 ENCOUNTER — Encounter: Payer: Self-pay | Admitting: Cardiology

## 2015-02-19 ENCOUNTER — Telehealth: Payer: Self-pay | Admitting: Internal Medicine

## 2015-02-19 NOTE — Telephone Encounter (Signed)
New message      Calling to see if we are getting regular readings on his monitor

## 2015-02-20 NOTE — Telephone Encounter (Signed)
Monitor disconnected- 800# given for technical support.

## 2015-02-22 ENCOUNTER — Encounter: Payer: Self-pay | Admitting: Internal Medicine

## 2015-02-27 ENCOUNTER — Telehealth: Payer: Self-pay | Admitting: *Deleted

## 2015-02-27 NOTE — Telephone Encounter (Signed)
LMOM asking patient to call back regarding pauses on LINQ transmission.

## 2015-02-28 ENCOUNTER — Ambulatory Visit (INDEPENDENT_AMBULATORY_CARE_PROVIDER_SITE_OTHER): Payer: Medicare Other | Admitting: *Deleted

## 2015-02-28 DIAGNOSIS — I639 Cerebral infarction, unspecified: Secondary | ICD-10-CM | POA: Diagnosis not present

## 2015-02-28 NOTE — Progress Notes (Signed)
Loop recorder 

## 2015-02-28 NOTE — Telephone Encounter (Signed)
Second attempt to reach patient.

## 2015-03-01 LAB — CUP PACEART REMOTE DEVICE CHECK: MDC IDC SESS DTM: 20160818114407

## 2015-03-01 NOTE — Telephone Encounter (Signed)
Called patient regarding pauses on LINQ transmissions.  Patient reports feeling asymptomatic during each specific pause episode.  Patient appreciative of call.  Patient made aware to call with worsening symptoms, questions, or concerns.  Patient voices understanding.

## 2015-03-01 NOTE — Progress Notes (Signed)
Carelink summary report received. Battery status OK. Normal device function. No new symptom, tachy episodes, or brady episodes. 3 pause episodes, patient asymptomatic, longest ~4sec. No new AF episodes. Monthly summary reports and ROV with GT on 04/25/15 at 11:15am.

## 2015-03-08 DIAGNOSIS — H9192 Unspecified hearing loss, left ear: Secondary | ICD-10-CM | POA: Diagnosis not present

## 2015-03-08 DIAGNOSIS — J342 Deviated nasal septum: Secondary | ICD-10-CM | POA: Diagnosis not present

## 2015-03-08 DIAGNOSIS — H6982 Other specified disorders of Eustachian tube, left ear: Secondary | ICD-10-CM | POA: Diagnosis not present

## 2015-03-15 ENCOUNTER — Encounter: Payer: Self-pay | Admitting: Internal Medicine

## 2015-03-16 ENCOUNTER — Encounter: Payer: Self-pay | Admitting: Internal Medicine

## 2015-03-16 ENCOUNTER — Telehealth: Payer: Self-pay | Admitting: *Deleted

## 2015-03-16 NOTE — Telephone Encounter (Signed)
Called patient regarding pause episode on LINQ transmission from 03/04/15 at 09:53.  Episode appropriate, ~5 seconds, patient states he was asymptomatic.  Patient denies any recent dizziness or other symptoms.  Confirmed patient's appointment with Dr. Lovena Le on 04/25/15 at 11:15am.  Patient aware to call with worsening symptoms, questions or concerns.

## 2015-03-27 DIAGNOSIS — H6521 Chronic serous otitis media, right ear: Secondary | ICD-10-CM | POA: Diagnosis not present

## 2015-03-29 ENCOUNTER — Encounter: Payer: Self-pay | Admitting: Cardiology

## 2015-03-30 ENCOUNTER — Ambulatory Visit (INDEPENDENT_AMBULATORY_CARE_PROVIDER_SITE_OTHER): Payer: Medicare Other | Admitting: *Deleted

## 2015-03-30 ENCOUNTER — Encounter: Payer: Self-pay | Admitting: Internal Medicine

## 2015-03-30 DIAGNOSIS — I639 Cerebral infarction, unspecified: Secondary | ICD-10-CM

## 2015-03-30 NOTE — Progress Notes (Signed)
Loop recorder 

## 2015-04-04 ENCOUNTER — Encounter: Payer: Self-pay | Admitting: Cardiology

## 2015-04-16 ENCOUNTER — Encounter: Payer: Self-pay | Admitting: Internal Medicine

## 2015-04-16 DIAGNOSIS — H6522 Chronic serous otitis media, left ear: Secondary | ICD-10-CM | POA: Diagnosis not present

## 2015-04-25 ENCOUNTER — Telehealth: Payer: Self-pay | Admitting: Internal Medicine

## 2015-04-25 ENCOUNTER — Encounter: Payer: Medicare Other | Admitting: Internal Medicine

## 2015-04-25 NOTE — Telephone Encounter (Signed)
New Message  Pt wife requetsed to speak w/ RN concerning home visit by Rn or Tech for pacer check- pt already has home monitor. Can use cell # if no answer on home #- [phone] Please call back and discuss.

## 2015-04-25 NOTE — Telephone Encounter (Signed)
Returned [employer].  She states that someone from [employer] called her recently to tell her that the patient is eligible for home health services.  She states his PCP is at the [facility].  She states that after talking with the patient, they would like to take advantage of this service.  Georgiann Mohs that she would have to direct that question/request to his PCP.  She voices understanding.  Ivin Booty had questions about why the patient's Carelink monitor is not automatically transmitting.  She states she has already spoken with Carelink tech services and that she was unable to understand their instructions, but that she has manually sent all of his recent transmissions.  She states the monitor has 2 bars for reception.  Explained that it may take up to 5 nights for the device to transmit fully.  Told Ivin Booty that I will call her back on Friday to see if patient's device transmits automatically, and if it does not, we will contact tech services for troubleshooting.  Ivin Booty is appreciative of return call and denies any additional questions at this time.

## 2015-04-27 NOTE — Telephone Encounter (Signed)
[facility] requesting return call.  Spoke with [employer].  Per rep, monitor activity logs suggest that patient has not been within 6-10 feet of monitor during transmission times.  When able to reach [facility] or the patient, need to see where patient sleeps to see if monitor should be relocated.

## 2015-04-29 LAB — CUP PACEART REMOTE DEVICE CHECK: Date Time Interrogation Session: 20160916050547

## 2015-04-29 NOTE — Progress Notes (Signed)
Carelink summary report received. Battery status OK. Normal device function. No new symptom, tachy, brady, or AF episodes. 5 pause episodes, longest ~5 sec, patient asymptomatic, previously reviewed. Monthly summary reports and ROV with GT on 05/29/15 at 2:00pm. Will extend pause detection to 4.5 sec at this appointment.

## 2015-04-30 ENCOUNTER — Ambulatory Visit (INDEPENDENT_AMBULATORY_CARE_PROVIDER_SITE_OTHER): Payer: Medicare Other | Admitting: *Deleted

## 2015-04-30 DIAGNOSIS — I639 Cerebral infarction, unspecified: Secondary | ICD-10-CM | POA: Diagnosis not present

## 2015-05-03 NOTE — Telephone Encounter (Signed)
Follow up  ° ° °Patient returning call back to nurse  °

## 2015-05-03 NOTE — Telephone Encounter (Signed)
LMOVM requesting return call. 

## 2015-05-03 NOTE — Telephone Encounter (Signed)
Spoke with Luke Allen.  Luke Allen states that the Carelink monitor is within 27ft of the bed where Luke Allen sleeps.  I advised calling Carelink tech services again to troubleshoot the monitor, but Luke Allen states that she is not willing to call right now because "they were no help last time we had this problem, they just sent a new monitor".  I recommended that she send manual transmissions once weekly and more often if patient has any symptom episodes.  I also recommended calling us if the patient is symptomatic with any episodes so that the manual transmission can be reviewed as soon as possible.  Luke Allen voices understanding and is appreciative of call.  She denies any additional questions or concerns at this time.  Will speak with [employer] rep regarding any additional troubleshooting recommendations.

## 2015-05-03 NOTE — Progress Notes (Signed)
LOOP RECORDER  

## 2015-05-17 LAB — CUP PACEART REMOTE DEVICE CHECK: Date Time Interrogation Session: 20161016050549

## 2015-05-17 NOTE — Progress Notes (Signed)
Carelink summary report received. Battery status OK. Normal device function. No new symptom, tachy, brady, or AF episodes. 1 pause episode, duration 4 sec, patient asymptomatic. Monthly summary reports and ROV with GT on 05/29/15 at 2:00pm.

## 2015-05-24 ENCOUNTER — Encounter: Payer: Self-pay | Admitting: Cardiology

## 2015-05-29 ENCOUNTER — Ambulatory Visit (INDEPENDENT_AMBULATORY_CARE_PROVIDER_SITE_OTHER): Payer: Medicare Other | Admitting: *Deleted

## 2015-05-29 ENCOUNTER — Encounter: Payer: Self-pay | Admitting: Internal Medicine

## 2015-05-29 ENCOUNTER — Ambulatory Visit (INDEPENDENT_AMBULATORY_CARE_PROVIDER_SITE_OTHER): Payer: Medicare Other | Admitting: Internal Medicine

## 2015-05-29 VITALS — BP 130/70 | HR 49 | Ht 72.0 in | Wt 276.2 lb

## 2015-05-29 DIAGNOSIS — H3411 Central retinal artery occlusion, right eye: Secondary | ICD-10-CM

## 2015-05-29 DIAGNOSIS — I639 Cerebral infarction, unspecified: Secondary | ICD-10-CM

## 2015-05-29 DIAGNOSIS — Z720 Tobacco use: Secondary | ICD-10-CM

## 2015-05-29 DIAGNOSIS — I161 Hypertensive emergency: Secondary | ICD-10-CM

## 2015-05-29 LAB — CUP PACEART INCLINIC DEVICE CHECK: Date Time Interrogation Session: 20161115155037

## 2015-05-29 NOTE — Progress Notes (Signed)
HPI [name] returns today for follow-up. He has a history of HTN, s/p stroke, s/p ILR, COPD, very sedentary lifestyle, and DM. The patient has been very sedentary in recent weeks. He smokes a pack of cigarettes a day, down from 2 PPD.  His appetite is still good. He has become more sedentary. He denies syncope or near syncope or dizziness.  No Known Allergies   Current Outpatient Prescriptions  Medication Sig Dispense Refill  . albuterol (PROVENTIL HFA;VENTOLIN HFA) 108 (90 BASE) MCG/ACT inhaler Inhale 2 puffs into the lungs every 6 (six) hours as needed for wheezing or shortness of breath.    Marland Kitchen aspirin 325 MG tablet Take 1 tablet (325 mg total) by mouth daily. 30 tablet 0  . bisacodyl (DULCOLAX) 5 MG EC tablet Take 5 mg by mouth daily as needed for moderate constipation.    . cetirizine (ZYRTEC) 10 MG tablet Take 10 mg by mouth daily as needed for allergies.    Marland Kitchen lisinopril (PRINIVIL,ZESTRIL) 40 MG tablet Take 20 mg by mouth daily.    . methylphenidate (RITALIN) 10 MG tablet Take 10 mg by mouth 3 (three) times daily with meals. Unknown prn, pt stated he takes when he needs it for seziures    . nitroGLYCERIN (NITROSTAT) 0.4 MG SL tablet Place 0.4 mg under the tongue every 5 (five) minutes as needed for chest pain.    Marland Kitchen omeprazole (PRILOSEC) 20 MG capsule Take 20 mg by mouth daily.    . tamsulosin (FLOMAX) 0.4 MG CAPS capsule Take 0.4 mg by mouth at bedtime.    . triamcinolone ointment (KENALOG) 0.5 % Apply 1 application topically 2 (two) times daily. Apply to affected area    . venlafaxine XR (EFFEXOR-XR) 150 MG 24 hr capsule Take 150 mg by mouth 2 (two) times daily.    Marland Kitchen venlafaxine XR (EFFEXOR-XR) 37.5 MG 24 hr capsule Take 37.5 mg by mouth every evening.     . vitamin C (ASCORBIC ACID) 500 MG tablet Take 500 mg by mouth daily.     No current facility-administered medications for this visit.     Past Medical History  Diagnosis Date  . Acid reflux   . Anxiety   . Sleep apnea    . Catalepsy   . Narcolepsy   . Sleep apnea   . Hypertension   . Seizures ([place])   . Arthritis   . Coronary artery disease   . Stroke ([place]) 08/27/2013    [facility] notes 09/01/2013    ROS:   All systems reviewed and negative except as noted in the HPI.   Past Surgical History  Procedure Laterality Date  . Arm surgery     . Tonsillectomy    . Tee without cardioversion N/A 08/31/2013    Procedure: TRANSESOPHAGEAL ECHOCARDIOGRAM (TEE);  Surgeon: Thayer Headings, MD;  Location: [facility] ENDOSCOPY;  Service: Cardiovascular;  Laterality: N/A;  . Abdominal aortic aneurysm repair  02/2013    [facility] notes 09/01/2013  . Loop recorder implant  08/31/13    MDT LinQ implanted by Dr Lovena Le for cryptogenic stroke     Family History  Problem Relation Age of Onset  . Heart disease Mother   . Heart failure Father   . Heart disease Father      Social History   Social History  . Marital Status: Single    Spouse Name: N/A  . Number of Children: N/A  . Years of Education: N/A   Occupational History  .  Not on file.   Social History Main Topics  . Smoking status: Current Every Day Smoker    Types: Cigarettes    Last Attempt to Quit: 08/29/2013  . Smokeless tobacco: Not on file  . Alcohol Use: No  . Drug Use: No  . Sexual Activity: Not on file   Other Topics Concern  . Not on file   Social History Narrative     BP 130/70 mmHg  Pulse 49  Ht 6' (1.829 m)  Wt 276 lb 3.2 oz (125.283 kg)  BMI 37.45 kg/m2  SpO2 97%  Physical Exam:  Chronically ill appearing 80 year old man, NAD HEENT: Unremarkable Neck:  6 cm JVD, no thyromegally, no bruits Back:  No CVA tenderness Lungs:  Clear with no wheezes, rales, or rhonchi. HEART:  Regular rate rhythm, no murmurs, no rubs, no clicks Abd:  soft, positive bowel sounds, no organomegally, no rebound, no guarding Ext:  2 plus pulses, no edema, no cyanosis, no clubbing Skin:  No rashes no nodules Neuro:  CN II through XII intact, motor grossly  intact   DEVICE  Normal ILR function.  See PaceArt for details.   Assess/Plan:

## 2015-05-29 NOTE — Progress Notes (Signed)
Carelink Summary Report / Loop recorder 

## 2015-05-29 NOTE — Assessment & Plan Note (Signed)
I have encouraged the patient to stop smoking. Will follow.

## 2015-05-29 NOTE — Patient Instructions (Signed)
Medication Instructions:  Your physician recommends that you continue on your current medications as directed. Please refer to the Current Medication list given to you today.  Labwork: None ordered  Testing/Procedures: None ordered  Follow-Up: Your physician wants you to follow-up in: 1 year with Dr. Taylor. You will receive a reminder letter in the mail two months in advance. If you don't receive a letter, please call our office to schedule the follow-up appointment.  If you need a refill on your cardiac medications before your next appointment, please call your pharmacy.  Thank you for choosing [facility]!!        

## 2015-05-29 NOTE — Assessment & Plan Note (Signed)
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 He has undergone an ILR. No atrial fib has been observed.

## 2015-05-29 NOTE — Telephone Encounter (Signed)
Will address troubleshooting with patient at appointment this afternoon.

## 2015-05-29 NOTE — Assessment & Plan Note (Signed)
His blood pressure is reasonably well controlled. He is encouraged to lose weight.

## 2015-06-28 ENCOUNTER — Encounter: Payer: Self-pay | Admitting: Cardiology

## 2015-06-28 ENCOUNTER — Ambulatory Visit (INDEPENDENT_AMBULATORY_CARE_PROVIDER_SITE_OTHER): Payer: Medicare Other | Admitting: *Deleted

## 2015-06-28 DIAGNOSIS — I639 Cerebral infarction, unspecified: Secondary | ICD-10-CM

## 2015-06-28 NOTE — Progress Notes (Signed)
Carelink Summary Report / Loop Recorder 

## 2015-07-02 LAB — CUP PACEART REMOTE DEVICE CHECK: Date Time Interrogation Session: 20161115050549

## 2015-07-05 ENCOUNTER — Encounter: Payer: Self-pay | Admitting: Cardiology

## 2015-07-30 ENCOUNTER — Ambulatory Visit (INDEPENDENT_AMBULATORY_CARE_PROVIDER_SITE_OTHER): Payer: Medicare Other | Admitting: *Deleted

## 2015-07-30 DIAGNOSIS — I639 Cerebral infarction, unspecified: Secondary | ICD-10-CM

## 2015-07-31 NOTE — Progress Notes (Signed)
Carelink Summary Report / Loop Recorder 

## 2015-08-11 LAB — CUP PACEART REMOTE DEVICE CHECK: MDC IDC SESS DTM: 20161215050814

## 2015-08-15 ENCOUNTER — Encounter: Payer: Self-pay | Admitting: Cardiology

## 2015-08-27 ENCOUNTER — Ambulatory Visit (INDEPENDENT_AMBULATORY_CARE_PROVIDER_SITE_OTHER): Payer: Medicare Other | Admitting: *Deleted

## 2015-08-27 DIAGNOSIS — I639 Cerebral infarction, unspecified: Secondary | ICD-10-CM

## 2015-08-27 NOTE — Progress Notes (Signed)
Carelink Summary Report / Loop Recorder 

## 2015-09-17 LAB — CUP PACEART REMOTE DEVICE CHECK: Date Time Interrogation Session: 20170114053523

## 2015-09-17 NOTE — Progress Notes (Signed)
Carelink summary report received. Battery status OK. Normal device function. No new symptom episodes, tachy episodes, brady, or pause episodes. No new AF episodes. Monthly summary reports and ROV/PRN 

## 2015-09-19 LAB — CUP PACEART REMOTE DEVICE CHECK: Date Time Interrogation Session: 20170213130100

## 2015-09-19 NOTE — Progress Notes (Signed)
Carelink summary report received. Battery status OK. Normal device function. No new symptom episodes, tachy episodes, brady, or pause episodes. No new AF episodes. Monthly summary reports and ROV/PRN 

## 2015-09-26 ENCOUNTER — Ambulatory Visit (INDEPENDENT_AMBULATORY_CARE_PROVIDER_SITE_OTHER): Payer: Medicare Other | Admitting: *Deleted

## 2015-09-26 DIAGNOSIS — I639 Cerebral infarction, unspecified: Secondary | ICD-10-CM | POA: Diagnosis not present

## 2015-09-26 NOTE — Progress Notes (Signed)
Carelink Summary Report / Loop Recorder 

## 2015-10-16 DIAGNOSIS — J449 Chronic obstructive pulmonary disease, unspecified: Secondary | ICD-10-CM | POA: Diagnosis not present

## 2015-10-16 DIAGNOSIS — I10 Essential (primary) hypertension: Secondary | ICD-10-CM | POA: Diagnosis not present

## 2015-10-16 DIAGNOSIS — M549 Dorsalgia, unspecified: Secondary | ICD-10-CM | POA: Diagnosis not present

## 2015-10-16 DIAGNOSIS — R600 Localized edema: Secondary | ICD-10-CM | POA: Diagnosis not present

## 2015-10-16 DIAGNOSIS — Z1389 Encounter for screening for other disorder: Secondary | ICD-10-CM | POA: Diagnosis not present

## 2015-10-22 DIAGNOSIS — Z7982 Long term (current) use of aspirin: Secondary | ICD-10-CM | POA: Diagnosis not present

## 2015-10-22 DIAGNOSIS — Z9181 History of falling: Secondary | ICD-10-CM | POA: Diagnosis not present

## 2015-10-22 DIAGNOSIS — H5441 Blindness, right eye, normal vision left eye: Secondary | ICD-10-CM | POA: Diagnosis not present

## 2015-10-22 DIAGNOSIS — I10 Essential (primary) hypertension: Secondary | ICD-10-CM | POA: Diagnosis not present

## 2015-10-22 DIAGNOSIS — J441 Chronic obstructive pulmonary disease with (acute) exacerbation: Secondary | ICD-10-CM | POA: Diagnosis not present

## 2015-10-22 DIAGNOSIS — I2510 Atherosclerotic heart disease of native coronary artery without angina pectoris: Secondary | ICD-10-CM | POA: Diagnosis not present

## 2015-10-22 DIAGNOSIS — M545 Low back pain: Secondary | ICD-10-CM | POA: Diagnosis not present

## 2015-10-24 DIAGNOSIS — M545 Low back pain: Secondary | ICD-10-CM | POA: Diagnosis not present

## 2015-10-24 DIAGNOSIS — I2510 Atherosclerotic heart disease of native coronary artery without angina pectoris: Secondary | ICD-10-CM | POA: Diagnosis not present

## 2015-10-24 DIAGNOSIS — I10 Essential (primary) hypertension: Secondary | ICD-10-CM | POA: Diagnosis not present

## 2015-10-24 DIAGNOSIS — Z9181 History of falling: Secondary | ICD-10-CM | POA: Diagnosis not present

## 2015-10-24 DIAGNOSIS — J441 Chronic obstructive pulmonary disease with (acute) exacerbation: Secondary | ICD-10-CM | POA: Diagnosis not present

## 2015-10-24 DIAGNOSIS — Z7982 Long term (current) use of aspirin: Secondary | ICD-10-CM | POA: Diagnosis not present

## 2015-10-24 DIAGNOSIS — H5441 Blindness, right eye, normal vision left eye: Secondary | ICD-10-CM | POA: Diagnosis not present

## 2015-10-26 ENCOUNTER — Ambulatory Visit (INDEPENDENT_AMBULATORY_CARE_PROVIDER_SITE_OTHER): Payer: Medicare Other | Admitting: *Deleted

## 2015-10-26 DIAGNOSIS — I639 Cerebral infarction, unspecified: Secondary | ICD-10-CM | POA: Diagnosis not present

## 2015-10-29 NOTE — Progress Notes (Signed)
Carelink Summary Report / Loop Recorder 

## 2015-10-30 DIAGNOSIS — Z9181 History of falling: Secondary | ICD-10-CM | POA: Diagnosis not present

## 2015-10-30 DIAGNOSIS — H5441 Blindness, right eye, normal vision left eye: Secondary | ICD-10-CM | POA: Diagnosis not present

## 2015-10-30 DIAGNOSIS — Z7982 Long term (current) use of aspirin: Secondary | ICD-10-CM | POA: Diagnosis not present

## 2015-10-30 DIAGNOSIS — I2510 Atherosclerotic heart disease of native coronary artery without angina pectoris: Secondary | ICD-10-CM | POA: Diagnosis not present

## 2015-10-30 DIAGNOSIS — J441 Chronic obstructive pulmonary disease with (acute) exacerbation: Secondary | ICD-10-CM | POA: Diagnosis not present

## 2015-10-30 DIAGNOSIS — I10 Essential (primary) hypertension: Secondary | ICD-10-CM | POA: Diagnosis not present

## 2015-10-30 DIAGNOSIS — M545 Low back pain: Secondary | ICD-10-CM | POA: Diagnosis not present

## 2015-11-02 DIAGNOSIS — I2510 Atherosclerotic heart disease of native coronary artery without angina pectoris: Secondary | ICD-10-CM | POA: Diagnosis not present

## 2015-11-02 DIAGNOSIS — M545 Low back pain: Secondary | ICD-10-CM | POA: Diagnosis not present

## 2015-11-02 DIAGNOSIS — I10 Essential (primary) hypertension: Secondary | ICD-10-CM | POA: Diagnosis not present

## 2015-11-02 DIAGNOSIS — Z9181 History of falling: Secondary | ICD-10-CM | POA: Diagnosis not present

## 2015-11-02 DIAGNOSIS — J441 Chronic obstructive pulmonary disease with (acute) exacerbation: Secondary | ICD-10-CM | POA: Diagnosis not present

## 2015-11-02 DIAGNOSIS — Z7982 Long term (current) use of aspirin: Secondary | ICD-10-CM | POA: Diagnosis not present

## 2015-11-02 DIAGNOSIS — H5441 Blindness, right eye, normal vision left eye: Secondary | ICD-10-CM | POA: Diagnosis not present

## 2015-11-07 DIAGNOSIS — H5441 Blindness, right eye, normal vision left eye: Secondary | ICD-10-CM | POA: Diagnosis not present

## 2015-11-07 DIAGNOSIS — M545 Low back pain: Secondary | ICD-10-CM | POA: Diagnosis not present

## 2015-11-07 DIAGNOSIS — I2510 Atherosclerotic heart disease of native coronary artery without angina pectoris: Secondary | ICD-10-CM | POA: Diagnosis not present

## 2015-11-07 DIAGNOSIS — Z7982 Long term (current) use of aspirin: Secondary | ICD-10-CM | POA: Diagnosis not present

## 2015-11-07 DIAGNOSIS — J441 Chronic obstructive pulmonary disease with (acute) exacerbation: Secondary | ICD-10-CM | POA: Diagnosis not present

## 2015-11-07 DIAGNOSIS — I10 Essential (primary) hypertension: Secondary | ICD-10-CM | POA: Diagnosis not present

## 2015-11-07 DIAGNOSIS — Z9181 History of falling: Secondary | ICD-10-CM | POA: Diagnosis not present

## 2015-11-09 DIAGNOSIS — J441 Chronic obstructive pulmonary disease with (acute) exacerbation: Secondary | ICD-10-CM | POA: Diagnosis not present

## 2015-11-09 DIAGNOSIS — I2510 Atherosclerotic heart disease of native coronary artery without angina pectoris: Secondary | ICD-10-CM | POA: Diagnosis not present

## 2015-11-09 DIAGNOSIS — H5441 Blindness, right eye, normal vision left eye: Secondary | ICD-10-CM | POA: Diagnosis not present

## 2015-11-09 DIAGNOSIS — I10 Essential (primary) hypertension: Secondary | ICD-10-CM | POA: Diagnosis not present

## 2015-11-09 DIAGNOSIS — Z7982 Long term (current) use of aspirin: Secondary | ICD-10-CM | POA: Diagnosis not present

## 2015-11-09 DIAGNOSIS — M545 Low back pain: Secondary | ICD-10-CM | POA: Diagnosis not present

## 2015-11-09 DIAGNOSIS — Z9181 History of falling: Secondary | ICD-10-CM | POA: Diagnosis not present

## 2015-11-14 DIAGNOSIS — D485 Neoplasm of uncertain behavior of skin: Secondary | ICD-10-CM | POA: Diagnosis not present

## 2015-11-14 DIAGNOSIS — C44529 Squamous cell carcinoma of skin of other part of trunk: Secondary | ICD-10-CM | POA: Diagnosis not present

## 2015-11-14 DIAGNOSIS — L820 Inflamed seborrheic keratosis: Secondary | ICD-10-CM | POA: Diagnosis not present

## 2015-11-14 DIAGNOSIS — L578 Other skin changes due to chronic exposure to nonionizing radiation: Secondary | ICD-10-CM | POA: Diagnosis not present

## 2015-11-16 DIAGNOSIS — H5441 Blindness, right eye, normal vision left eye: Secondary | ICD-10-CM | POA: Diagnosis not present

## 2015-11-16 DIAGNOSIS — Z9181 History of falling: Secondary | ICD-10-CM | POA: Diagnosis not present

## 2015-11-16 DIAGNOSIS — M545 Low back pain: Secondary | ICD-10-CM | POA: Diagnosis not present

## 2015-11-16 DIAGNOSIS — Z7982 Long term (current) use of aspirin: Secondary | ICD-10-CM | POA: Diagnosis not present

## 2015-11-16 DIAGNOSIS — I2510 Atherosclerotic heart disease of native coronary artery without angina pectoris: Secondary | ICD-10-CM | POA: Diagnosis not present

## 2015-11-16 DIAGNOSIS — I10 Essential (primary) hypertension: Secondary | ICD-10-CM | POA: Diagnosis not present

## 2015-11-16 DIAGNOSIS — J441 Chronic obstructive pulmonary disease with (acute) exacerbation: Secondary | ICD-10-CM | POA: Diagnosis not present

## 2015-11-19 DIAGNOSIS — I10 Essential (primary) hypertension: Secondary | ICD-10-CM | POA: Diagnosis not present

## 2015-11-19 DIAGNOSIS — Z139 Encounter for screening, unspecified: Secondary | ICD-10-CM | POA: Diagnosis not present

## 2015-11-19 DIAGNOSIS — M549 Dorsalgia, unspecified: Secondary | ICD-10-CM | POA: Diagnosis not present

## 2015-11-19 DIAGNOSIS — R600 Localized edema: Secondary | ICD-10-CM | POA: Diagnosis not present

## 2015-11-19 DIAGNOSIS — J449 Chronic obstructive pulmonary disease, unspecified: Secondary | ICD-10-CM | POA: Diagnosis not present

## 2015-11-21 DIAGNOSIS — H5441 Blindness, right eye, normal vision left eye: Secondary | ICD-10-CM | POA: Diagnosis not present

## 2015-11-21 DIAGNOSIS — I10 Essential (primary) hypertension: Secondary | ICD-10-CM | POA: Diagnosis not present

## 2015-11-21 DIAGNOSIS — Z7982 Long term (current) use of aspirin: Secondary | ICD-10-CM | POA: Diagnosis not present

## 2015-11-21 DIAGNOSIS — I2510 Atherosclerotic heart disease of native coronary artery without angina pectoris: Secondary | ICD-10-CM | POA: Diagnosis not present

## 2015-11-21 DIAGNOSIS — J441 Chronic obstructive pulmonary disease with (acute) exacerbation: Secondary | ICD-10-CM | POA: Diagnosis not present

## 2015-11-21 DIAGNOSIS — Z9181 History of falling: Secondary | ICD-10-CM | POA: Diagnosis not present

## 2015-11-21 DIAGNOSIS — M545 Low back pain: Secondary | ICD-10-CM | POA: Diagnosis not present

## 2015-11-26 ENCOUNTER — Encounter: Payer: Medicare Other | Admitting: *Deleted

## 2015-11-26 ENCOUNTER — Ambulatory Visit (INDEPENDENT_AMBULATORY_CARE_PROVIDER_SITE_OTHER): Payer: Medicare Other | Admitting: *Deleted

## 2015-11-26 DIAGNOSIS — I639 Cerebral infarction, unspecified: Secondary | ICD-10-CM | POA: Diagnosis not present

## 2015-11-26 NOTE — Progress Notes (Signed)
Carelink Summary Report / Loop Recorder 

## 2015-11-28 DIAGNOSIS — H5441 Blindness, right eye, normal vision left eye: Secondary | ICD-10-CM | POA: Diagnosis not present

## 2015-11-28 DIAGNOSIS — I10 Essential (primary) hypertension: Secondary | ICD-10-CM | POA: Diagnosis not present

## 2015-11-28 DIAGNOSIS — M545 Low back pain: Secondary | ICD-10-CM | POA: Diagnosis not present

## 2015-11-28 DIAGNOSIS — Z9181 History of falling: Secondary | ICD-10-CM | POA: Diagnosis not present

## 2015-11-28 DIAGNOSIS — Z7982 Long term (current) use of aspirin: Secondary | ICD-10-CM | POA: Diagnosis not present

## 2015-11-28 DIAGNOSIS — J441 Chronic obstructive pulmonary disease with (acute) exacerbation: Secondary | ICD-10-CM | POA: Diagnosis not present

## 2015-11-28 DIAGNOSIS — I2510 Atherosclerotic heart disease of native coronary artery without angina pectoris: Secondary | ICD-10-CM | POA: Diagnosis not present

## 2015-11-30 ENCOUNTER — Encounter: Payer: Self-pay | Admitting: Cardiology

## 2015-12-05 ENCOUNTER — Telehealth: Payer: Self-pay | Admitting: Internal Medicine

## 2015-12-05 NOTE — Telephone Encounter (Signed)
Returned [employer].  Advised that a manual transmission was received today and that she does not need to do anything further at this time.  Sharon verbalizes understanding and appreciation.

## 2015-12-05 NOTE — Telephone Encounter (Signed)
°  1. Has your device fired? no  2. Is you device beeping? no  3. Are you experiencing draining or swelling at device site? No   4. Are you calling to see if we received your device transmission? No   5. Have you passed out? No  Need to know how to perform a remote transmission check,b/c a letter was sent that they didn't receive it.

## 2015-12-07 LAB — CUP PACEART REMOTE DEVICE CHECK: Date Time Interrogation Session: 20170315060638

## 2015-12-09 LAB — CUP PACEART REMOTE DEVICE CHECK: Date Time Interrogation Session: 20170414060624

## 2015-12-09 NOTE — Progress Notes (Signed)
Carelink summary report received. Battery status OK. Normal device function. No new symptom episodes, tachy episodes, brady, or pause episodes. No new AF episodes. Monthly summary reports and ROV/PRN 

## 2015-12-18 DIAGNOSIS — C44529 Squamous cell carcinoma of skin of other part of trunk: Secondary | ICD-10-CM | POA: Diagnosis not present

## 2015-12-25 ENCOUNTER — Ambulatory Visit (INDEPENDENT_AMBULATORY_CARE_PROVIDER_SITE_OTHER): Payer: Medicare Other | Admitting: *Deleted

## 2015-12-25 DIAGNOSIS — I639 Cerebral infarction, unspecified: Secondary | ICD-10-CM | POA: Diagnosis not present

## 2015-12-25 NOTE — Progress Notes (Signed)
Carelink Summary Report / Loop Recorder 

## 2015-12-27 DIAGNOSIS — J189 Pneumonia, unspecified organism: Secondary | ICD-10-CM | POA: Diagnosis not present

## 2015-12-27 DIAGNOSIS — J441 Chronic obstructive pulmonary disease with (acute) exacerbation: Secondary | ICD-10-CM | POA: Diagnosis not present

## 2015-12-27 DIAGNOSIS — R0902 Hypoxemia: Secondary | ICD-10-CM | POA: Diagnosis not present

## 2015-12-28 DIAGNOSIS — F418 Other specified anxiety disorders: Secondary | ICD-10-CM | POA: Diagnosis not present

## 2015-12-28 DIAGNOSIS — J189 Pneumonia, unspecified organism: Secondary | ICD-10-CM | POA: Diagnosis not present

## 2015-12-28 DIAGNOSIS — I509 Heart failure, unspecified: Secondary | ICD-10-CM | POA: Diagnosis not present

## 2015-12-28 DIAGNOSIS — Z6837 Body mass index (BMI) 37.0-37.9, adult: Secondary | ICD-10-CM | POA: Diagnosis not present

## 2015-12-28 DIAGNOSIS — J441 Chronic obstructive pulmonary disease with (acute) exacerbation: Secondary | ICD-10-CM | POA: Diagnosis not present

## 2015-12-28 DIAGNOSIS — Z23 Encounter for immunization: Secondary | ICD-10-CM | POA: Diagnosis not present

## 2015-12-28 DIAGNOSIS — J9600 Acute respiratory failure, unspecified whether with hypoxia or hypercapnia: Secondary | ICD-10-CM | POA: Diagnosis not present

## 2015-12-28 DIAGNOSIS — I5033 Acute on chronic diastolic (congestive) heart failure: Secondary | ICD-10-CM | POA: Diagnosis not present

## 2015-12-28 DIAGNOSIS — J156 Pneumonia due to other aerobic Gram-negative bacteria: Secondary | ICD-10-CM | POA: Diagnosis not present

## 2015-12-28 DIAGNOSIS — Z8673 Personal history of transient ischemic attack (TIA), and cerebral infarction without residual deficits: Secondary | ICD-10-CM | POA: Diagnosis not present

## 2015-12-28 DIAGNOSIS — R079 Chest pain, unspecified: Secondary | ICD-10-CM | POA: Diagnosis not present

## 2015-12-28 DIAGNOSIS — Z66 Do not resuscitate: Secondary | ICD-10-CM | POA: Diagnosis not present

## 2015-12-28 DIAGNOSIS — Z7982 Long term (current) use of aspirin: Secondary | ICD-10-CM | POA: Diagnosis not present

## 2015-12-28 DIAGNOSIS — Z8546 Personal history of malignant neoplasm of prostate: Secondary | ICD-10-CM | POA: Diagnosis not present

## 2015-12-28 DIAGNOSIS — N400 Enlarged prostate without lower urinary tract symptoms: Secondary | ICD-10-CM | POA: Diagnosis not present

## 2015-12-28 DIAGNOSIS — R918 Other nonspecific abnormal finding of lung field: Secondary | ICD-10-CM | POA: Diagnosis not present

## 2015-12-28 DIAGNOSIS — R0902 Hypoxemia: Secondary | ICD-10-CM | POA: Diagnosis not present

## 2015-12-28 DIAGNOSIS — J9601 Acute respiratory failure with hypoxia: Secondary | ICD-10-CM | POA: Diagnosis not present

## 2015-12-28 DIAGNOSIS — E8770 Fluid overload, unspecified: Secondary | ICD-10-CM | POA: Diagnosis not present

## 2015-12-28 DIAGNOSIS — R0602 Shortness of breath: Secondary | ICD-10-CM | POA: Diagnosis not present

## 2015-12-28 DIAGNOSIS — Z79899 Other long term (current) drug therapy: Secondary | ICD-10-CM | POA: Diagnosis not present

## 2015-12-28 DIAGNOSIS — I10 Essential (primary) hypertension: Secondary | ICD-10-CM | POA: Diagnosis not present

## 2015-12-28 DIAGNOSIS — E119 Type 2 diabetes mellitus without complications: Secondary | ICD-10-CM | POA: Diagnosis not present

## 2015-12-28 DIAGNOSIS — F17210 Nicotine dependence, cigarettes, uncomplicated: Secondary | ICD-10-CM | POA: Diagnosis not present

## 2015-12-28 DIAGNOSIS — I2510 Atherosclerotic heart disease of native coronary artery without angina pectoris: Secondary | ICD-10-CM | POA: Diagnosis not present

## 2015-12-28 DIAGNOSIS — E662 Morbid (severe) obesity with alveolar hypoventilation: Secondary | ICD-10-CM | POA: Diagnosis not present

## 2015-12-28 DIAGNOSIS — Z9861 Coronary angioplasty status: Secondary | ICD-10-CM | POA: Diagnosis not present

## 2015-12-28 DIAGNOSIS — J449 Chronic obstructive pulmonary disease, unspecified: Secondary | ICD-10-CM | POA: Diagnosis not present

## 2015-12-28 DIAGNOSIS — N183 Chronic kidney disease, stage 3 (moderate): Secondary | ICD-10-CM | POA: Diagnosis not present

## 2015-12-28 DIAGNOSIS — J440 Chronic obstructive pulmonary disease with acute lower respiratory infection: Secondary | ICD-10-CM | POA: Diagnosis not present

## 2015-12-28 DIAGNOSIS — I5041 Acute combined systolic (congestive) and diastolic (congestive) heart failure: Secondary | ICD-10-CM | POA: Diagnosis not present

## 2015-12-28 DIAGNOSIS — I252 Old myocardial infarction: Secondary | ICD-10-CM | POA: Diagnosis not present

## 2015-12-29 DIAGNOSIS — F419 Anxiety disorder, unspecified: Secondary | ICD-10-CM

## 2015-12-29 DIAGNOSIS — J441 Chronic obstructive pulmonary disease with (acute) exacerbation: Secondary | ICD-10-CM | POA: Diagnosis not present

## 2015-12-29 DIAGNOSIS — J156 Pneumonia due to other aerobic Gram-negative bacteria: Secondary | ICD-10-CM

## 2015-12-29 DIAGNOSIS — N400 Enlarged prostate without lower urinary tract symptoms: Secondary | ICD-10-CM

## 2015-12-29 DIAGNOSIS — I10 Essential (primary) hypertension: Secondary | ICD-10-CM

## 2015-12-29 DIAGNOSIS — J9601 Acute respiratory failure with hypoxia: Secondary | ICD-10-CM | POA: Diagnosis not present

## 2015-12-29 DIAGNOSIS — I2510 Atherosclerotic heart disease of native coronary artery without angina pectoris: Secondary | ICD-10-CM | POA: Diagnosis not present

## 2015-12-31 DIAGNOSIS — F418 Other specified anxiety disorders: Secondary | ICD-10-CM | POA: Diagnosis not present

## 2015-12-31 DIAGNOSIS — F17210 Nicotine dependence, cigarettes, uncomplicated: Secondary | ICD-10-CM | POA: Diagnosis not present

## 2015-12-31 DIAGNOSIS — J156 Pneumonia due to other aerobic Gram-negative bacteria: Secondary | ICD-10-CM | POA: Diagnosis not present

## 2015-12-31 DIAGNOSIS — R262 Difficulty in walking, not elsewhere classified: Secondary | ICD-10-CM | POA: Diagnosis not present

## 2015-12-31 DIAGNOSIS — J9611 Chronic respiratory failure with hypoxia: Secondary | ICD-10-CM | POA: Diagnosis not present

## 2015-12-31 DIAGNOSIS — J441 Chronic obstructive pulmonary disease with (acute) exacerbation: Secondary | ICD-10-CM | POA: Diagnosis not present

## 2015-12-31 DIAGNOSIS — I119 Hypertensive heart disease without heart failure: Secondary | ICD-10-CM | POA: Diagnosis not present

## 2015-12-31 DIAGNOSIS — E8770 Fluid overload, unspecified: Secondary | ICD-10-CM | POA: Diagnosis not present

## 2015-12-31 DIAGNOSIS — J9600 Acute respiratory failure, unspecified whether with hypoxia or hypercapnia: Secondary | ICD-10-CM | POA: Diagnosis not present

## 2015-12-31 DIAGNOSIS — I5033 Acute on chronic diastolic (congestive) heart failure: Secondary | ICD-10-CM | POA: Diagnosis not present

## 2015-12-31 DIAGNOSIS — J440 Chronic obstructive pulmonary disease with acute lower respiratory infection: Secondary | ICD-10-CM | POA: Diagnosis not present

## 2015-12-31 DIAGNOSIS — Z9861 Coronary angioplasty status: Secondary | ICD-10-CM | POA: Diagnosis not present

## 2015-12-31 DIAGNOSIS — E119 Type 2 diabetes mellitus without complications: Secondary | ICD-10-CM | POA: Diagnosis not present

## 2015-12-31 DIAGNOSIS — N400 Enlarged prostate without lower urinary tract symptoms: Secondary | ICD-10-CM

## 2015-12-31 DIAGNOSIS — I10 Essential (primary) hypertension: Secondary | ICD-10-CM

## 2015-12-31 DIAGNOSIS — J189 Pneumonia, unspecified organism: Secondary | ICD-10-CM | POA: Diagnosis not present

## 2015-12-31 DIAGNOSIS — Z79899 Other long term (current) drug therapy: Secondary | ICD-10-CM | POA: Diagnosis not present

## 2015-12-31 DIAGNOSIS — Z8673 Personal history of transient ischemic attack (TIA), and cerebral infarction without residual deficits: Secondary | ICD-10-CM | POA: Diagnosis not present

## 2015-12-31 DIAGNOSIS — I5041 Acute combined systolic (congestive) and diastolic (congestive) heart failure: Secondary | ICD-10-CM | POA: Diagnosis not present

## 2015-12-31 DIAGNOSIS — Z23 Encounter for immunization: Secondary | ICD-10-CM | POA: Diagnosis not present

## 2015-12-31 DIAGNOSIS — E662 Morbid (severe) obesity with alveolar hypoventilation: Secondary | ICD-10-CM | POA: Diagnosis not present

## 2015-12-31 DIAGNOSIS — Z6837 Body mass index (BMI) 37.0-37.9, adult: Secondary | ICD-10-CM | POA: Diagnosis not present

## 2015-12-31 DIAGNOSIS — Z66 Do not resuscitate: Secondary | ICD-10-CM | POA: Diagnosis not present

## 2015-12-31 DIAGNOSIS — F17200 Nicotine dependence, unspecified, uncomplicated: Secondary | ICD-10-CM

## 2015-12-31 DIAGNOSIS — I252 Old myocardial infarction: Secondary | ICD-10-CM | POA: Diagnosis not present

## 2015-12-31 DIAGNOSIS — J449 Chronic obstructive pulmonary disease, unspecified: Secondary | ICD-10-CM | POA: Diagnosis not present

## 2015-12-31 DIAGNOSIS — Z7982 Long term (current) use of aspirin: Secondary | ICD-10-CM | POA: Diagnosis not present

## 2015-12-31 DIAGNOSIS — N183 Chronic kidney disease, stage 3 (moderate): Secondary | ICD-10-CM | POA: Diagnosis not present

## 2015-12-31 DIAGNOSIS — I2510 Atherosclerotic heart disease of native coronary artery without angina pectoris: Secondary | ICD-10-CM | POA: Diagnosis not present

## 2015-12-31 DIAGNOSIS — Z8546 Personal history of malignant neoplasm of prostate: Secondary | ICD-10-CM | POA: Diagnosis not present

## 2015-12-31 DIAGNOSIS — J9601 Acute respiratory failure with hypoxia: Secondary | ICD-10-CM | POA: Diagnosis not present

## 2015-12-31 DIAGNOSIS — F419 Anxiety disorder, unspecified: Secondary | ICD-10-CM

## 2016-01-01 DIAGNOSIS — I119 Hypertensive heart disease without heart failure: Secondary | ICD-10-CM | POA: Diagnosis not present

## 2016-01-01 DIAGNOSIS — J9611 Chronic respiratory failure with hypoxia: Secondary | ICD-10-CM | POA: Diagnosis not present

## 2016-01-01 DIAGNOSIS — J189 Pneumonia, unspecified organism: Secondary | ICD-10-CM | POA: Diagnosis not present

## 2016-01-01 DIAGNOSIS — R262 Difficulty in walking, not elsewhere classified: Secondary | ICD-10-CM | POA: Diagnosis not present

## 2016-01-01 LAB — CUP PACEART REMOTE DEVICE CHECK: Date Time Interrogation Session: 20170514063540

## 2016-01-04 ENCOUNTER — Telehealth: Payer: Self-pay | Admitting: Cardiology

## 2016-01-04 NOTE — Telephone Encounter (Signed)
Spoke w/ pt daughter and she informed me that pt monitor has not updated b/c pt is in a nursing home for the next 20 days. He will send transmission once he gets home.

## 2016-01-11 ENCOUNTER — Encounter: Payer: Self-pay | Admitting: Cardiology

## 2016-01-17 DIAGNOSIS — Z9861 Coronary angioplasty status: Secondary | ICD-10-CM | POA: Diagnosis not present

## 2016-01-17 DIAGNOSIS — G4733 Obstructive sleep apnea (adult) (pediatric): Secondary | ICD-10-CM | POA: Diagnosis not present

## 2016-01-17 DIAGNOSIS — Z8673 Personal history of transient ischemic attack (TIA), and cerebral infarction without residual deficits: Secondary | ICD-10-CM | POA: Diagnosis not present

## 2016-01-17 DIAGNOSIS — J441 Chronic obstructive pulmonary disease with (acute) exacerbation: Secondary | ICD-10-CM | POA: Diagnosis not present

## 2016-01-17 DIAGNOSIS — H5441 Blindness, right eye, normal vision left eye: Secondary | ICD-10-CM | POA: Diagnosis not present

## 2016-01-17 DIAGNOSIS — I10 Essential (primary) hypertension: Secondary | ICD-10-CM | POA: Diagnosis not present

## 2016-01-17 DIAGNOSIS — Z7982 Long term (current) use of aspirin: Secondary | ICD-10-CM | POA: Diagnosis not present

## 2016-01-17 DIAGNOSIS — Z9181 History of falling: Secondary | ICD-10-CM | POA: Diagnosis not present

## 2016-01-17 DIAGNOSIS — J440 Chronic obstructive pulmonary disease with acute lower respiratory infection: Secondary | ICD-10-CM | POA: Diagnosis not present

## 2016-01-17 DIAGNOSIS — M545 Low back pain: Secondary | ICD-10-CM | POA: Diagnosis not present

## 2016-01-17 DIAGNOSIS — J189 Pneumonia, unspecified organism: Secondary | ICD-10-CM | POA: Diagnosis not present

## 2016-01-17 DIAGNOSIS — I2510 Atherosclerotic heart disease of native coronary artery without angina pectoris: Secondary | ICD-10-CM | POA: Diagnosis not present

## 2016-01-18 DIAGNOSIS — G4733 Obstructive sleep apnea (adult) (pediatric): Secondary | ICD-10-CM | POA: Diagnosis not present

## 2016-01-18 DIAGNOSIS — M545 Low back pain: Secondary | ICD-10-CM | POA: Diagnosis not present

## 2016-01-18 DIAGNOSIS — J189 Pneumonia, unspecified organism: Secondary | ICD-10-CM | POA: Diagnosis not present

## 2016-01-18 DIAGNOSIS — Z7982 Long term (current) use of aspirin: Secondary | ICD-10-CM | POA: Diagnosis not present

## 2016-01-18 DIAGNOSIS — Z9861 Coronary angioplasty status: Secondary | ICD-10-CM | POA: Diagnosis not present

## 2016-01-18 DIAGNOSIS — H5441 Blindness, right eye, normal vision left eye: Secondary | ICD-10-CM | POA: Diagnosis not present

## 2016-01-18 DIAGNOSIS — J440 Chronic obstructive pulmonary disease with acute lower respiratory infection: Secondary | ICD-10-CM | POA: Diagnosis not present

## 2016-01-18 DIAGNOSIS — I10 Essential (primary) hypertension: Secondary | ICD-10-CM | POA: Diagnosis not present

## 2016-01-18 DIAGNOSIS — I2510 Atherosclerotic heart disease of native coronary artery without angina pectoris: Secondary | ICD-10-CM | POA: Diagnosis not present

## 2016-01-18 DIAGNOSIS — Z8673 Personal history of transient ischemic attack (TIA), and cerebral infarction without residual deficits: Secondary | ICD-10-CM | POA: Diagnosis not present

## 2016-01-18 DIAGNOSIS — J441 Chronic obstructive pulmonary disease with (acute) exacerbation: Secondary | ICD-10-CM | POA: Diagnosis not present

## 2016-01-18 DIAGNOSIS — Z9181 History of falling: Secondary | ICD-10-CM | POA: Diagnosis not present

## 2016-01-21 DIAGNOSIS — G4733 Obstructive sleep apnea (adult) (pediatric): Secondary | ICD-10-CM | POA: Diagnosis not present

## 2016-01-21 DIAGNOSIS — Z9861 Coronary angioplasty status: Secondary | ICD-10-CM | POA: Diagnosis not present

## 2016-01-21 DIAGNOSIS — Z8673 Personal history of transient ischemic attack (TIA), and cerebral infarction without residual deficits: Secondary | ICD-10-CM | POA: Diagnosis not present

## 2016-01-21 DIAGNOSIS — Z7982 Long term (current) use of aspirin: Secondary | ICD-10-CM | POA: Diagnosis not present

## 2016-01-21 DIAGNOSIS — J440 Chronic obstructive pulmonary disease with acute lower respiratory infection: Secondary | ICD-10-CM | POA: Diagnosis not present

## 2016-01-21 DIAGNOSIS — Z9181 History of falling: Secondary | ICD-10-CM | POA: Diagnosis not present

## 2016-01-21 DIAGNOSIS — J441 Chronic obstructive pulmonary disease with (acute) exacerbation: Secondary | ICD-10-CM | POA: Diagnosis not present

## 2016-01-21 DIAGNOSIS — M545 Low back pain: Secondary | ICD-10-CM | POA: Diagnosis not present

## 2016-01-21 DIAGNOSIS — J189 Pneumonia, unspecified organism: Secondary | ICD-10-CM | POA: Diagnosis not present

## 2016-01-21 DIAGNOSIS — I2510 Atherosclerotic heart disease of native coronary artery without angina pectoris: Secondary | ICD-10-CM | POA: Diagnosis not present

## 2016-01-21 DIAGNOSIS — H5441 Blindness, right eye, normal vision left eye: Secondary | ICD-10-CM | POA: Diagnosis not present

## 2016-01-21 DIAGNOSIS — I10 Essential (primary) hypertension: Secondary | ICD-10-CM | POA: Diagnosis not present

## 2016-01-22 DIAGNOSIS — J440 Chronic obstructive pulmonary disease with acute lower respiratory infection: Secondary | ICD-10-CM | POA: Diagnosis not present

## 2016-01-22 DIAGNOSIS — Z8673 Personal history of transient ischemic attack (TIA), and cerebral infarction without residual deficits: Secondary | ICD-10-CM | POA: Diagnosis not present

## 2016-01-22 DIAGNOSIS — J441 Chronic obstructive pulmonary disease with (acute) exacerbation: Secondary | ICD-10-CM | POA: Diagnosis not present

## 2016-01-22 DIAGNOSIS — J189 Pneumonia, unspecified organism: Secondary | ICD-10-CM | POA: Diagnosis not present

## 2016-01-22 DIAGNOSIS — I10 Essential (primary) hypertension: Secondary | ICD-10-CM | POA: Diagnosis not present

## 2016-01-22 DIAGNOSIS — Z7982 Long term (current) use of aspirin: Secondary | ICD-10-CM | POA: Diagnosis not present

## 2016-01-22 DIAGNOSIS — G4733 Obstructive sleep apnea (adult) (pediatric): Secondary | ICD-10-CM | POA: Diagnosis not present

## 2016-01-22 DIAGNOSIS — Z9181 History of falling: Secondary | ICD-10-CM | POA: Diagnosis not present

## 2016-01-22 DIAGNOSIS — I2510 Atherosclerotic heart disease of native coronary artery without angina pectoris: Secondary | ICD-10-CM | POA: Diagnosis not present

## 2016-01-22 DIAGNOSIS — M545 Low back pain: Secondary | ICD-10-CM | POA: Diagnosis not present

## 2016-01-22 DIAGNOSIS — Z9861 Coronary angioplasty status: Secondary | ICD-10-CM | POA: Diagnosis not present

## 2016-01-22 DIAGNOSIS — H5441 Blindness, right eye, normal vision left eye: Secondary | ICD-10-CM | POA: Diagnosis not present

## 2016-01-23 LAB — CUP PACEART REMOTE DEVICE CHECK: MDC IDC SESS DTM: 20170613070609

## 2016-01-24 ENCOUNTER — Ambulatory Visit (INDEPENDENT_AMBULATORY_CARE_PROVIDER_SITE_OTHER): Payer: Medicare Other | Admitting: *Deleted

## 2016-01-24 DIAGNOSIS — Z9861 Coronary angioplasty status: Secondary | ICD-10-CM | POA: Diagnosis not present

## 2016-01-24 DIAGNOSIS — M545 Low back pain: Secondary | ICD-10-CM | POA: Diagnosis not present

## 2016-01-24 DIAGNOSIS — I2510 Atherosclerotic heart disease of native coronary artery without angina pectoris: Secondary | ICD-10-CM | POA: Diagnosis not present

## 2016-01-24 DIAGNOSIS — Z7982 Long term (current) use of aspirin: Secondary | ICD-10-CM | POA: Diagnosis not present

## 2016-01-24 DIAGNOSIS — I10 Essential (primary) hypertension: Secondary | ICD-10-CM | POA: Diagnosis not present

## 2016-01-24 DIAGNOSIS — I639 Cerebral infarction, unspecified: Secondary | ICD-10-CM | POA: Diagnosis not present

## 2016-01-24 DIAGNOSIS — Z9181 History of falling: Secondary | ICD-10-CM | POA: Diagnosis not present

## 2016-01-24 DIAGNOSIS — H5441 Blindness, right eye, normal vision left eye: Secondary | ICD-10-CM | POA: Diagnosis not present

## 2016-01-24 DIAGNOSIS — G4733 Obstructive sleep apnea (adult) (pediatric): Secondary | ICD-10-CM | POA: Diagnosis not present

## 2016-01-24 DIAGNOSIS — J189 Pneumonia, unspecified organism: Secondary | ICD-10-CM | POA: Diagnosis not present

## 2016-01-24 DIAGNOSIS — J441 Chronic obstructive pulmonary disease with (acute) exacerbation: Secondary | ICD-10-CM | POA: Diagnosis not present

## 2016-01-24 DIAGNOSIS — J440 Chronic obstructive pulmonary disease with acute lower respiratory infection: Secondary | ICD-10-CM | POA: Diagnosis not present

## 2016-01-24 DIAGNOSIS — Z8673 Personal history of transient ischemic attack (TIA), and cerebral infarction without residual deficits: Secondary | ICD-10-CM | POA: Diagnosis not present

## 2016-01-24 NOTE — Progress Notes (Signed)
Carelink Summary Report / Loop Recorder 

## 2016-01-25 DIAGNOSIS — Z7982 Long term (current) use of aspirin: Secondary | ICD-10-CM | POA: Diagnosis not present

## 2016-01-25 DIAGNOSIS — Z8673 Personal history of transient ischemic attack (TIA), and cerebral infarction without residual deficits: Secondary | ICD-10-CM | POA: Diagnosis not present

## 2016-01-25 DIAGNOSIS — M545 Low back pain: Secondary | ICD-10-CM | POA: Diagnosis not present

## 2016-01-25 DIAGNOSIS — H5441 Blindness, right eye, normal vision left eye: Secondary | ICD-10-CM | POA: Diagnosis not present

## 2016-01-25 DIAGNOSIS — Z9861 Coronary angioplasty status: Secondary | ICD-10-CM | POA: Diagnosis not present

## 2016-01-25 DIAGNOSIS — J189 Pneumonia, unspecified organism: Secondary | ICD-10-CM | POA: Diagnosis not present

## 2016-01-25 DIAGNOSIS — G4733 Obstructive sleep apnea (adult) (pediatric): Secondary | ICD-10-CM | POA: Diagnosis not present

## 2016-01-25 DIAGNOSIS — J441 Chronic obstructive pulmonary disease with (acute) exacerbation: Secondary | ICD-10-CM | POA: Diagnosis not present

## 2016-01-25 DIAGNOSIS — J440 Chronic obstructive pulmonary disease with acute lower respiratory infection: Secondary | ICD-10-CM | POA: Diagnosis not present

## 2016-01-25 DIAGNOSIS — I10 Essential (primary) hypertension: Secondary | ICD-10-CM | POA: Diagnosis not present

## 2016-01-25 DIAGNOSIS — Z9181 History of falling: Secondary | ICD-10-CM | POA: Diagnosis not present

## 2016-01-25 DIAGNOSIS — I2510 Atherosclerotic heart disease of native coronary artery without angina pectoris: Secondary | ICD-10-CM | POA: Diagnosis not present

## 2016-01-28 DIAGNOSIS — M545 Low back pain: Secondary | ICD-10-CM | POA: Diagnosis not present

## 2016-01-28 DIAGNOSIS — G4733 Obstructive sleep apnea (adult) (pediatric): Secondary | ICD-10-CM | POA: Diagnosis not present

## 2016-01-28 DIAGNOSIS — J189 Pneumonia, unspecified organism: Secondary | ICD-10-CM | POA: Diagnosis not present

## 2016-01-28 DIAGNOSIS — Z7982 Long term (current) use of aspirin: Secondary | ICD-10-CM | POA: Diagnosis not present

## 2016-01-28 DIAGNOSIS — I2510 Atherosclerotic heart disease of native coronary artery without angina pectoris: Secondary | ICD-10-CM | POA: Diagnosis not present

## 2016-01-28 DIAGNOSIS — J441 Chronic obstructive pulmonary disease with (acute) exacerbation: Secondary | ICD-10-CM | POA: Diagnosis not present

## 2016-01-28 DIAGNOSIS — J440 Chronic obstructive pulmonary disease with acute lower respiratory infection: Secondary | ICD-10-CM | POA: Diagnosis not present

## 2016-01-28 DIAGNOSIS — Z8673 Personal history of transient ischemic attack (TIA), and cerebral infarction without residual deficits: Secondary | ICD-10-CM | POA: Diagnosis not present

## 2016-01-28 DIAGNOSIS — Z9861 Coronary angioplasty status: Secondary | ICD-10-CM | POA: Diagnosis not present

## 2016-01-28 DIAGNOSIS — Z9181 History of falling: Secondary | ICD-10-CM | POA: Diagnosis not present

## 2016-01-28 DIAGNOSIS — I10 Essential (primary) hypertension: Secondary | ICD-10-CM | POA: Diagnosis not present

## 2016-01-28 DIAGNOSIS — H5441 Blindness, right eye, normal vision left eye: Secondary | ICD-10-CM | POA: Diagnosis not present

## 2016-01-30 DIAGNOSIS — G4733 Obstructive sleep apnea (adult) (pediatric): Secondary | ICD-10-CM | POA: Diagnosis not present

## 2016-01-30 DIAGNOSIS — Z8673 Personal history of transient ischemic attack (TIA), and cerebral infarction without residual deficits: Secondary | ICD-10-CM | POA: Diagnosis not present

## 2016-01-30 DIAGNOSIS — I10 Essential (primary) hypertension: Secondary | ICD-10-CM | POA: Diagnosis not present

## 2016-01-30 DIAGNOSIS — Z9181 History of falling: Secondary | ICD-10-CM | POA: Diagnosis not present

## 2016-01-30 DIAGNOSIS — I2510 Atherosclerotic heart disease of native coronary artery without angina pectoris: Secondary | ICD-10-CM | POA: Diagnosis not present

## 2016-01-30 DIAGNOSIS — Z7982 Long term (current) use of aspirin: Secondary | ICD-10-CM | POA: Diagnosis not present

## 2016-01-30 DIAGNOSIS — M545 Low back pain: Secondary | ICD-10-CM | POA: Diagnosis not present

## 2016-01-30 DIAGNOSIS — H5441 Blindness, right eye, normal vision left eye: Secondary | ICD-10-CM | POA: Diagnosis not present

## 2016-01-30 DIAGNOSIS — J441 Chronic obstructive pulmonary disease with (acute) exacerbation: Secondary | ICD-10-CM | POA: Diagnosis not present

## 2016-01-30 DIAGNOSIS — J440 Chronic obstructive pulmonary disease with acute lower respiratory infection: Secondary | ICD-10-CM | POA: Diagnosis not present

## 2016-01-30 DIAGNOSIS — J189 Pneumonia, unspecified organism: Secondary | ICD-10-CM | POA: Diagnosis not present

## 2016-01-30 DIAGNOSIS — Z9861 Coronary angioplasty status: Secondary | ICD-10-CM | POA: Diagnosis not present

## 2016-01-31 DIAGNOSIS — Z7982 Long term (current) use of aspirin: Secondary | ICD-10-CM | POA: Diagnosis not present

## 2016-01-31 DIAGNOSIS — J441 Chronic obstructive pulmonary disease with (acute) exacerbation: Secondary | ICD-10-CM | POA: Diagnosis not present

## 2016-01-31 DIAGNOSIS — J189 Pneumonia, unspecified organism: Secondary | ICD-10-CM | POA: Diagnosis not present

## 2016-01-31 DIAGNOSIS — G4733 Obstructive sleep apnea (adult) (pediatric): Secondary | ICD-10-CM | POA: Diagnosis not present

## 2016-01-31 DIAGNOSIS — Z8673 Personal history of transient ischemic attack (TIA), and cerebral infarction without residual deficits: Secondary | ICD-10-CM | POA: Diagnosis not present

## 2016-01-31 DIAGNOSIS — I2510 Atherosclerotic heart disease of native coronary artery without angina pectoris: Secondary | ICD-10-CM | POA: Diagnosis not present

## 2016-01-31 DIAGNOSIS — Z9181 History of falling: Secondary | ICD-10-CM | POA: Diagnosis not present

## 2016-01-31 DIAGNOSIS — J440 Chronic obstructive pulmonary disease with acute lower respiratory infection: Secondary | ICD-10-CM | POA: Diagnosis not present

## 2016-01-31 DIAGNOSIS — H5441 Blindness, right eye, normal vision left eye: Secondary | ICD-10-CM | POA: Diagnosis not present

## 2016-01-31 DIAGNOSIS — I10 Essential (primary) hypertension: Secondary | ICD-10-CM | POA: Diagnosis not present

## 2016-01-31 DIAGNOSIS — Z9861 Coronary angioplasty status: Secondary | ICD-10-CM | POA: Diagnosis not present

## 2016-01-31 DIAGNOSIS — M545 Low back pain: Secondary | ICD-10-CM | POA: Diagnosis not present

## 2016-02-01 LAB — CUP PACEART REMOTE DEVICE CHECK: MDC IDC SESS DTM: 20170717130528

## 2016-02-04 DIAGNOSIS — M545 Low back pain: Secondary | ICD-10-CM | POA: Diagnosis not present

## 2016-02-04 DIAGNOSIS — J159 Unspecified bacterial pneumonia: Secondary | ICD-10-CM | POA: Diagnosis not present

## 2016-02-04 DIAGNOSIS — G4733 Obstructive sleep apnea (adult) (pediatric): Secondary | ICD-10-CM | POA: Diagnosis not present

## 2016-02-04 DIAGNOSIS — Z9181 History of falling: Secondary | ICD-10-CM | POA: Diagnosis not present

## 2016-02-04 DIAGNOSIS — J441 Chronic obstructive pulmonary disease with (acute) exacerbation: Secondary | ICD-10-CM | POA: Diagnosis not present

## 2016-02-04 DIAGNOSIS — Z8673 Personal history of transient ischemic attack (TIA), and cerebral infarction without residual deficits: Secondary | ICD-10-CM | POA: Diagnosis not present

## 2016-02-04 DIAGNOSIS — Z9861 Coronary angioplasty status: Secondary | ICD-10-CM | POA: Diagnosis not present

## 2016-02-04 DIAGNOSIS — Z7982 Long term (current) use of aspirin: Secondary | ICD-10-CM | POA: Diagnosis not present

## 2016-02-04 DIAGNOSIS — R600 Localized edema: Secondary | ICD-10-CM | POA: Diagnosis not present

## 2016-02-04 DIAGNOSIS — I2510 Atherosclerotic heart disease of native coronary artery without angina pectoris: Secondary | ICD-10-CM | POA: Diagnosis not present

## 2016-02-04 DIAGNOSIS — I5032 Chronic diastolic (congestive) heart failure: Secondary | ICD-10-CM | POA: Diagnosis not present

## 2016-02-04 DIAGNOSIS — J449 Chronic obstructive pulmonary disease, unspecified: Secondary | ICD-10-CM | POA: Diagnosis not present

## 2016-02-04 DIAGNOSIS — I10 Essential (primary) hypertension: Secondary | ICD-10-CM | POA: Diagnosis not present

## 2016-02-04 DIAGNOSIS — J440 Chronic obstructive pulmonary disease with acute lower respiratory infection: Secondary | ICD-10-CM | POA: Diagnosis not present

## 2016-02-04 DIAGNOSIS — H5441 Blindness, right eye, normal vision left eye: Secondary | ICD-10-CM | POA: Diagnosis not present

## 2016-02-04 DIAGNOSIS — J189 Pneumonia, unspecified organism: Secondary | ICD-10-CM | POA: Diagnosis not present

## 2016-02-06 DIAGNOSIS — Z7982 Long term (current) use of aspirin: Secondary | ICD-10-CM | POA: Diagnosis not present

## 2016-02-06 DIAGNOSIS — Z9861 Coronary angioplasty status: Secondary | ICD-10-CM | POA: Diagnosis not present

## 2016-02-06 DIAGNOSIS — J189 Pneumonia, unspecified organism: Secondary | ICD-10-CM | POA: Diagnosis not present

## 2016-02-06 DIAGNOSIS — Z9181 History of falling: Secondary | ICD-10-CM | POA: Diagnosis not present

## 2016-02-06 DIAGNOSIS — G4733 Obstructive sleep apnea (adult) (pediatric): Secondary | ICD-10-CM | POA: Diagnosis not present

## 2016-02-06 DIAGNOSIS — Z8673 Personal history of transient ischemic attack (TIA), and cerebral infarction without residual deficits: Secondary | ICD-10-CM | POA: Diagnosis not present

## 2016-02-06 DIAGNOSIS — M545 Low back pain: Secondary | ICD-10-CM | POA: Diagnosis not present

## 2016-02-06 DIAGNOSIS — I10 Essential (primary) hypertension: Secondary | ICD-10-CM | POA: Diagnosis not present

## 2016-02-06 DIAGNOSIS — I2510 Atherosclerotic heart disease of native coronary artery without angina pectoris: Secondary | ICD-10-CM | POA: Diagnosis not present

## 2016-02-06 DIAGNOSIS — H5441 Blindness, right eye, normal vision left eye: Secondary | ICD-10-CM | POA: Diagnosis not present

## 2016-02-06 DIAGNOSIS — J441 Chronic obstructive pulmonary disease with (acute) exacerbation: Secondary | ICD-10-CM | POA: Diagnosis not present

## 2016-02-06 DIAGNOSIS — J440 Chronic obstructive pulmonary disease with acute lower respiratory infection: Secondary | ICD-10-CM | POA: Diagnosis not present

## 2016-02-11 DIAGNOSIS — Z9181 History of falling: Secondary | ICD-10-CM | POA: Diagnosis not present

## 2016-02-11 DIAGNOSIS — I10 Essential (primary) hypertension: Secondary | ICD-10-CM | POA: Diagnosis not present

## 2016-02-11 DIAGNOSIS — M545 Low back pain: Secondary | ICD-10-CM | POA: Diagnosis not present

## 2016-02-11 DIAGNOSIS — J441 Chronic obstructive pulmonary disease with (acute) exacerbation: Secondary | ICD-10-CM | POA: Diagnosis not present

## 2016-02-11 DIAGNOSIS — G4733 Obstructive sleep apnea (adult) (pediatric): Secondary | ICD-10-CM | POA: Diagnosis not present

## 2016-02-11 DIAGNOSIS — J189 Pneumonia, unspecified organism: Secondary | ICD-10-CM | POA: Diagnosis not present

## 2016-02-11 DIAGNOSIS — I2510 Atherosclerotic heart disease of native coronary artery without angina pectoris: Secondary | ICD-10-CM | POA: Diagnosis not present

## 2016-02-11 DIAGNOSIS — J440 Chronic obstructive pulmonary disease with acute lower respiratory infection: Secondary | ICD-10-CM | POA: Diagnosis not present

## 2016-02-11 DIAGNOSIS — Z8673 Personal history of transient ischemic attack (TIA), and cerebral infarction without residual deficits: Secondary | ICD-10-CM | POA: Diagnosis not present

## 2016-02-11 DIAGNOSIS — Z9861 Coronary angioplasty status: Secondary | ICD-10-CM | POA: Diagnosis not present

## 2016-02-11 DIAGNOSIS — Z7982 Long term (current) use of aspirin: Secondary | ICD-10-CM | POA: Diagnosis not present

## 2016-02-11 DIAGNOSIS — H5441 Blindness, right eye, normal vision left eye: Secondary | ICD-10-CM | POA: Diagnosis not present

## 2016-02-16 DIAGNOSIS — J9601 Acute respiratory failure with hypoxia: Secondary | ICD-10-CM | POA: Diagnosis not present

## 2016-02-16 DIAGNOSIS — J449 Chronic obstructive pulmonary disease, unspecified: Secondary | ICD-10-CM | POA: Diagnosis not present

## 2016-02-18 DIAGNOSIS — I2510 Atherosclerotic heart disease of native coronary artery without angina pectoris: Secondary | ICD-10-CM | POA: Diagnosis not present

## 2016-02-18 DIAGNOSIS — G4733 Obstructive sleep apnea (adult) (pediatric): Secondary | ICD-10-CM | POA: Diagnosis not present

## 2016-02-18 DIAGNOSIS — H5441 Blindness, right eye, normal vision left eye: Secondary | ICD-10-CM | POA: Diagnosis not present

## 2016-02-18 DIAGNOSIS — J441 Chronic obstructive pulmonary disease with (acute) exacerbation: Secondary | ICD-10-CM | POA: Diagnosis not present

## 2016-02-18 DIAGNOSIS — Z7982 Long term (current) use of aspirin: Secondary | ICD-10-CM | POA: Diagnosis not present

## 2016-02-18 DIAGNOSIS — J440 Chronic obstructive pulmonary disease with acute lower respiratory infection: Secondary | ICD-10-CM | POA: Diagnosis not present

## 2016-02-18 DIAGNOSIS — I10 Essential (primary) hypertension: Secondary | ICD-10-CM | POA: Diagnosis not present

## 2016-02-18 DIAGNOSIS — J189 Pneumonia, unspecified organism: Secondary | ICD-10-CM | POA: Diagnosis not present

## 2016-02-18 DIAGNOSIS — Z8673 Personal history of transient ischemic attack (TIA), and cerebral infarction without residual deficits: Secondary | ICD-10-CM | POA: Diagnosis not present

## 2016-02-18 DIAGNOSIS — M545 Low back pain: Secondary | ICD-10-CM | POA: Diagnosis not present

## 2016-02-18 DIAGNOSIS — Z9861 Coronary angioplasty status: Secondary | ICD-10-CM | POA: Diagnosis not present

## 2016-02-18 DIAGNOSIS — Z9181 History of falling: Secondary | ICD-10-CM | POA: Diagnosis not present

## 2016-02-19 DIAGNOSIS — J439 Emphysema, unspecified: Secondary | ICD-10-CM | POA: Diagnosis not present

## 2016-02-19 DIAGNOSIS — J159 Unspecified bacterial pneumonia: Secondary | ICD-10-CM | POA: Diagnosis not present

## 2016-02-21 ENCOUNTER — Telehealth: Payer: Self-pay | Admitting: Cardiology

## 2016-02-21 NOTE — Telephone Encounter (Signed)
Attempted to call pt about his monitor not updating. No answer and unable to leave a message.

## 2016-02-25 ENCOUNTER — Ambulatory Visit (INDEPENDENT_AMBULATORY_CARE_PROVIDER_SITE_OTHER): Payer: Medicare Other | Admitting: *Deleted

## 2016-02-25 DIAGNOSIS — I639 Cerebral infarction, unspecified: Secondary | ICD-10-CM

## 2016-02-25 NOTE — Progress Notes (Signed)
Carelink Summary Report / Loop Recorder 

## 2016-02-26 DIAGNOSIS — I5032 Chronic diastolic (congestive) heart failure: Secondary | ICD-10-CM | POA: Diagnosis not present

## 2016-02-26 DIAGNOSIS — E876 Hypokalemia: Secondary | ICD-10-CM | POA: Diagnosis not present

## 2016-02-26 DIAGNOSIS — J449 Chronic obstructive pulmonary disease, unspecified: Secondary | ICD-10-CM | POA: Diagnosis not present

## 2016-02-26 DIAGNOSIS — E538 Deficiency of other specified B group vitamins: Secondary | ICD-10-CM | POA: Diagnosis not present

## 2016-02-27 DIAGNOSIS — Z8673 Personal history of transient ischemic attack (TIA), and cerebral infarction without residual deficits: Secondary | ICD-10-CM | POA: Diagnosis not present

## 2016-02-27 DIAGNOSIS — Z9861 Coronary angioplasty status: Secondary | ICD-10-CM | POA: Diagnosis not present

## 2016-02-27 DIAGNOSIS — M545 Low back pain: Secondary | ICD-10-CM | POA: Diagnosis not present

## 2016-02-27 DIAGNOSIS — I2510 Atherosclerotic heart disease of native coronary artery without angina pectoris: Secondary | ICD-10-CM | POA: Diagnosis not present

## 2016-02-27 DIAGNOSIS — H5441 Blindness, right eye, normal vision left eye: Secondary | ICD-10-CM | POA: Diagnosis not present

## 2016-02-27 DIAGNOSIS — J441 Chronic obstructive pulmonary disease with (acute) exacerbation: Secondary | ICD-10-CM | POA: Diagnosis not present

## 2016-02-27 DIAGNOSIS — J189 Pneumonia, unspecified organism: Secondary | ICD-10-CM | POA: Diagnosis not present

## 2016-02-27 DIAGNOSIS — G4733 Obstructive sleep apnea (adult) (pediatric): Secondary | ICD-10-CM | POA: Diagnosis not present

## 2016-02-27 DIAGNOSIS — Z9181 History of falling: Secondary | ICD-10-CM | POA: Diagnosis not present

## 2016-02-27 DIAGNOSIS — Z7982 Long term (current) use of aspirin: Secondary | ICD-10-CM | POA: Diagnosis not present

## 2016-02-27 DIAGNOSIS — J440 Chronic obstructive pulmonary disease with acute lower respiratory infection: Secondary | ICD-10-CM | POA: Diagnosis not present

## 2016-02-27 DIAGNOSIS — I10 Essential (primary) hypertension: Secondary | ICD-10-CM | POA: Diagnosis not present

## 2016-03-05 DIAGNOSIS — Z9861 Coronary angioplasty status: Secondary | ICD-10-CM | POA: Diagnosis not present

## 2016-03-05 DIAGNOSIS — Z7982 Long term (current) use of aspirin: Secondary | ICD-10-CM | POA: Diagnosis not present

## 2016-03-05 DIAGNOSIS — I10 Essential (primary) hypertension: Secondary | ICD-10-CM | POA: Diagnosis not present

## 2016-03-05 DIAGNOSIS — I2510 Atherosclerotic heart disease of native coronary artery without angina pectoris: Secondary | ICD-10-CM | POA: Diagnosis not present

## 2016-03-05 DIAGNOSIS — G4733 Obstructive sleep apnea (adult) (pediatric): Secondary | ICD-10-CM | POA: Diagnosis not present

## 2016-03-05 DIAGNOSIS — Z8673 Personal history of transient ischemic attack (TIA), and cerebral infarction without residual deficits: Secondary | ICD-10-CM | POA: Diagnosis not present

## 2016-03-05 DIAGNOSIS — H5441 Blindness, right eye, normal vision left eye: Secondary | ICD-10-CM | POA: Diagnosis not present

## 2016-03-05 DIAGNOSIS — J441 Chronic obstructive pulmonary disease with (acute) exacerbation: Secondary | ICD-10-CM | POA: Diagnosis not present

## 2016-03-05 DIAGNOSIS — M545 Low back pain: Secondary | ICD-10-CM | POA: Diagnosis not present

## 2016-03-05 DIAGNOSIS — J189 Pneumonia, unspecified organism: Secondary | ICD-10-CM | POA: Diagnosis not present

## 2016-03-05 DIAGNOSIS — J440 Chronic obstructive pulmonary disease with acute lower respiratory infection: Secondary | ICD-10-CM | POA: Diagnosis not present

## 2016-03-05 DIAGNOSIS — Z9181 History of falling: Secondary | ICD-10-CM | POA: Diagnosis not present

## 2016-03-11 DIAGNOSIS — Z8673 Personal history of transient ischemic attack (TIA), and cerebral infarction without residual deficits: Secondary | ICD-10-CM | POA: Diagnosis not present

## 2016-03-11 DIAGNOSIS — Z9181 History of falling: Secondary | ICD-10-CM | POA: Diagnosis not present

## 2016-03-11 DIAGNOSIS — J189 Pneumonia, unspecified organism: Secondary | ICD-10-CM | POA: Diagnosis not present

## 2016-03-11 DIAGNOSIS — I2510 Atherosclerotic heart disease of native coronary artery without angina pectoris: Secondary | ICD-10-CM | POA: Diagnosis not present

## 2016-03-11 DIAGNOSIS — M545 Low back pain: Secondary | ICD-10-CM | POA: Diagnosis not present

## 2016-03-11 DIAGNOSIS — Z9861 Coronary angioplasty status: Secondary | ICD-10-CM | POA: Diagnosis not present

## 2016-03-11 DIAGNOSIS — J441 Chronic obstructive pulmonary disease with (acute) exacerbation: Secondary | ICD-10-CM | POA: Diagnosis not present

## 2016-03-11 DIAGNOSIS — I10 Essential (primary) hypertension: Secondary | ICD-10-CM | POA: Diagnosis not present

## 2016-03-11 DIAGNOSIS — Z7982 Long term (current) use of aspirin: Secondary | ICD-10-CM | POA: Diagnosis not present

## 2016-03-11 DIAGNOSIS — H5441 Blindness, right eye, normal vision left eye: Secondary | ICD-10-CM | POA: Diagnosis not present

## 2016-03-11 DIAGNOSIS — G4733 Obstructive sleep apnea (adult) (pediatric): Secondary | ICD-10-CM | POA: Diagnosis not present

## 2016-03-11 DIAGNOSIS — J440 Chronic obstructive pulmonary disease with acute lower respiratory infection: Secondary | ICD-10-CM | POA: Diagnosis not present

## 2016-03-18 DIAGNOSIS — J449 Chronic obstructive pulmonary disease, unspecified: Secondary | ICD-10-CM | POA: Diagnosis not present

## 2016-03-18 DIAGNOSIS — J9601 Acute respiratory failure with hypoxia: Secondary | ICD-10-CM | POA: Diagnosis not present

## 2016-03-22 LAB — CUP PACEART REMOTE DEVICE CHECK: Date Time Interrogation Session: 20170812073603

## 2016-03-24 ENCOUNTER — Ambulatory Visit (INDEPENDENT_AMBULATORY_CARE_PROVIDER_SITE_OTHER): Payer: Medicare Other | Admitting: *Deleted

## 2016-03-24 DIAGNOSIS — I639 Cerebral infarction, unspecified: Secondary | ICD-10-CM

## 2016-03-24 NOTE — Progress Notes (Signed)
Carelink Summary Report / Loop Recorder 

## 2016-03-28 DIAGNOSIS — I5032 Chronic diastolic (congestive) heart failure: Secondary | ICD-10-CM | POA: Diagnosis not present

## 2016-03-28 DIAGNOSIS — I10 Essential (primary) hypertension: Secondary | ICD-10-CM | POA: Diagnosis not present

## 2016-03-28 DIAGNOSIS — J449 Chronic obstructive pulmonary disease, unspecified: Secondary | ICD-10-CM | POA: Diagnosis not present

## 2016-03-28 DIAGNOSIS — E876 Hypokalemia: Secondary | ICD-10-CM | POA: Diagnosis not present

## 2016-04-08 DIAGNOSIS — N289 Disorder of kidney and ureter, unspecified: Secondary | ICD-10-CM | POA: Diagnosis not present

## 2016-04-17 DIAGNOSIS — J449 Chronic obstructive pulmonary disease, unspecified: Secondary | ICD-10-CM | POA: Diagnosis not present

## 2016-04-17 DIAGNOSIS — J9601 Acute respiratory failure with hypoxia: Secondary | ICD-10-CM | POA: Diagnosis not present

## 2016-04-19 LAB — CUP PACEART REMOTE DEVICE CHECK: Date Time Interrogation Session: 20170911080620

## 2016-04-19 NOTE — Progress Notes (Signed)
Carelink summary report received. Battery status OK. Normal device function. No new symptom episodes, tachy episodes, brady, or pause episodes. No new AF episodes. Monthly summary reports and ROV/PRN 

## 2016-04-23 ENCOUNTER — Ambulatory Visit (INDEPENDENT_AMBULATORY_CARE_PROVIDER_SITE_OTHER): Payer: Medicare Other | Admitting: *Deleted

## 2016-04-23 DIAGNOSIS — I639 Cerebral infarction, unspecified: Secondary | ICD-10-CM

## 2016-04-23 NOTE — Progress Notes (Signed)
Carelink Summary Report / Loop Recorder 

## 2016-04-24 ENCOUNTER — Telehealth: Payer: Self-pay | Admitting: Cardiology

## 2016-04-24 NOTE — Telephone Encounter (Signed)
Spoke w/ pt her and requested that pt send a manual transmission b/c his home monitor has not updated in at least 14 days.

## 2016-04-29 DIAGNOSIS — M79672 Pain in left foot: Secondary | ICD-10-CM | POA: Diagnosis not present

## 2016-04-29 DIAGNOSIS — L03119 Cellulitis of unspecified part of limb: Secondary | ICD-10-CM | POA: Diagnosis not present

## 2016-05-02 DIAGNOSIS — Z79899 Other long term (current) drug therapy: Secondary | ICD-10-CM | POA: Diagnosis not present

## 2016-05-02 DIAGNOSIS — L02612 Cutaneous abscess of left foot: Secondary | ICD-10-CM | POA: Diagnosis not present

## 2016-05-02 DIAGNOSIS — I10 Essential (primary) hypertension: Secondary | ICD-10-CM | POA: Diagnosis not present

## 2016-05-02 DIAGNOSIS — S91302A Unspecified open wound, left foot, initial encounter: Secondary | ICD-10-CM | POA: Diagnosis not present

## 2016-05-02 DIAGNOSIS — J449 Chronic obstructive pulmonary disease, unspecified: Secondary | ICD-10-CM | POA: Diagnosis not present

## 2016-05-02 DIAGNOSIS — N289 Disorder of kidney and ureter, unspecified: Secondary | ICD-10-CM | POA: Diagnosis not present

## 2016-05-02 DIAGNOSIS — R569 Unspecified convulsions: Secondary | ICD-10-CM | POA: Diagnosis not present

## 2016-05-02 DIAGNOSIS — M109 Gout, unspecified: Secondary | ICD-10-CM | POA: Diagnosis not present

## 2016-05-02 DIAGNOSIS — E860 Dehydration: Secondary | ICD-10-CM | POA: Diagnosis not present

## 2016-05-02 DIAGNOSIS — Z79891 Long term (current) use of opiate analgesic: Secondary | ICD-10-CM | POA: Diagnosis not present

## 2016-05-02 DIAGNOSIS — J441 Chronic obstructive pulmonary disease with (acute) exacerbation: Secondary | ICD-10-CM | POA: Diagnosis not present

## 2016-05-02 DIAGNOSIS — R54 Age-related physical debility: Secondary | ICD-10-CM | POA: Diagnosis not present

## 2016-05-02 DIAGNOSIS — Z7982 Long term (current) use of aspirin: Secondary | ICD-10-CM | POA: Diagnosis not present

## 2016-05-02 DIAGNOSIS — N189 Chronic kidney disease, unspecified: Secondary | ICD-10-CM | POA: Diagnosis not present

## 2016-05-02 DIAGNOSIS — G8929 Other chronic pain: Secondary | ICD-10-CM | POA: Diagnosis not present

## 2016-05-02 DIAGNOSIS — Z8673 Personal history of transient ischemic attack (TIA), and cerebral infarction without residual deficits: Secondary | ICD-10-CM | POA: Diagnosis not present

## 2016-05-02 DIAGNOSIS — I129 Hypertensive chronic kidney disease with stage 1 through stage 4 chronic kidney disease, or unspecified chronic kidney disease: Secondary | ICD-10-CM | POA: Diagnosis not present

## 2016-05-02 DIAGNOSIS — R05 Cough: Secondary | ICD-10-CM | POA: Diagnosis not present

## 2016-05-02 DIAGNOSIS — K5730 Diverticulosis of large intestine without perforation or abscess without bleeding: Secondary | ICD-10-CM | POA: Diagnosis not present

## 2016-05-02 DIAGNOSIS — L03116 Cellulitis of left lower limb: Secondary | ICD-10-CM | POA: Diagnosis not present

## 2016-05-02 DIAGNOSIS — R4781 Slurred speech: Secondary | ICD-10-CM | POA: Diagnosis not present

## 2016-05-02 DIAGNOSIS — L03032 Cellulitis of left toe: Secondary | ICD-10-CM | POA: Diagnosis not present

## 2016-05-02 DIAGNOSIS — R5381 Other malaise: Secondary | ICD-10-CM | POA: Diagnosis not present

## 2016-05-03 DIAGNOSIS — N289 Disorder of kidney and ureter, unspecified: Secondary | ICD-10-CM | POA: Insufficient documentation

## 2016-05-03 DIAGNOSIS — I10 Essential (primary) hypertension: Secondary | ICD-10-CM | POA: Insufficient documentation

## 2016-05-03 DIAGNOSIS — L03116 Cellulitis of left lower limb: Secondary | ICD-10-CM | POA: Insufficient documentation

## 2016-05-03 DIAGNOSIS — J441 Chronic obstructive pulmonary disease with (acute) exacerbation: Secondary | ICD-10-CM | POA: Insufficient documentation

## 2016-05-03 DIAGNOSIS — R54 Age-related physical debility: Secondary | ICD-10-CM | POA: Insufficient documentation

## 2016-05-12 DIAGNOSIS — I2510 Atherosclerotic heart disease of native coronary artery without angina pectoris: Secondary | ICD-10-CM | POA: Diagnosis not present

## 2016-05-12 DIAGNOSIS — K219 Gastro-esophageal reflux disease without esophagitis: Secondary | ICD-10-CM | POA: Diagnosis not present

## 2016-05-12 DIAGNOSIS — L02612 Cutaneous abscess of left foot: Secondary | ICD-10-CM | POA: Diagnosis not present

## 2016-05-12 DIAGNOSIS — J441 Chronic obstructive pulmonary disease with (acute) exacerbation: Secondary | ICD-10-CM | POA: Diagnosis not present

## 2016-05-12 DIAGNOSIS — M1990 Unspecified osteoarthritis, unspecified site: Secondary | ICD-10-CM | POA: Diagnosis not present

## 2016-05-12 DIAGNOSIS — I129 Hypertensive chronic kidney disease with stage 1 through stage 4 chronic kidney disease, or unspecified chronic kidney disease: Secondary | ICD-10-CM | POA: Diagnosis not present

## 2016-05-12 DIAGNOSIS — G8929 Other chronic pain: Secondary | ICD-10-CM | POA: Diagnosis not present

## 2016-05-12 DIAGNOSIS — Z7982 Long term (current) use of aspirin: Secondary | ICD-10-CM | POA: Diagnosis not present

## 2016-05-12 DIAGNOSIS — L03116 Cellulitis of left lower limb: Secondary | ICD-10-CM | POA: Diagnosis not present

## 2016-05-12 DIAGNOSIS — H5461 Unqualified visual loss, right eye, normal vision left eye: Secondary | ICD-10-CM | POA: Diagnosis not present

## 2016-05-12 DIAGNOSIS — N189 Chronic kidney disease, unspecified: Secondary | ICD-10-CM | POA: Diagnosis not present

## 2016-05-13 DIAGNOSIS — L03116 Cellulitis of left lower limb: Secondary | ICD-10-CM | POA: Diagnosis not present

## 2016-05-13 DIAGNOSIS — M1990 Unspecified osteoarthritis, unspecified site: Secondary | ICD-10-CM | POA: Diagnosis not present

## 2016-05-13 DIAGNOSIS — Z7982 Long term (current) use of aspirin: Secondary | ICD-10-CM | POA: Diagnosis not present

## 2016-05-13 DIAGNOSIS — N189 Chronic kidney disease, unspecified: Secondary | ICD-10-CM | POA: Diagnosis not present

## 2016-05-13 DIAGNOSIS — G8929 Other chronic pain: Secondary | ICD-10-CM | POA: Diagnosis not present

## 2016-05-13 DIAGNOSIS — I129 Hypertensive chronic kidney disease with stage 1 through stage 4 chronic kidney disease, or unspecified chronic kidney disease: Secondary | ICD-10-CM | POA: Diagnosis not present

## 2016-05-13 DIAGNOSIS — H5461 Unqualified visual loss, right eye, normal vision left eye: Secondary | ICD-10-CM | POA: Diagnosis not present

## 2016-05-13 DIAGNOSIS — I2510 Atherosclerotic heart disease of native coronary artery without angina pectoris: Secondary | ICD-10-CM | POA: Diagnosis not present

## 2016-05-13 DIAGNOSIS — J441 Chronic obstructive pulmonary disease with (acute) exacerbation: Secondary | ICD-10-CM | POA: Diagnosis not present

## 2016-05-13 DIAGNOSIS — K219 Gastro-esophageal reflux disease without esophagitis: Secondary | ICD-10-CM | POA: Diagnosis not present

## 2016-05-13 DIAGNOSIS — L02612 Cutaneous abscess of left foot: Secondary | ICD-10-CM | POA: Diagnosis not present

## 2016-05-14 DIAGNOSIS — M109 Gout, unspecified: Secondary | ICD-10-CM | POA: Diagnosis not present

## 2016-05-15 DIAGNOSIS — M1990 Unspecified osteoarthritis, unspecified site: Secondary | ICD-10-CM | POA: Diagnosis not present

## 2016-05-15 DIAGNOSIS — N189 Chronic kidney disease, unspecified: Secondary | ICD-10-CM | POA: Diagnosis not present

## 2016-05-15 DIAGNOSIS — J441 Chronic obstructive pulmonary disease with (acute) exacerbation: Secondary | ICD-10-CM | POA: Diagnosis not present

## 2016-05-15 DIAGNOSIS — G8929 Other chronic pain: Secondary | ICD-10-CM | POA: Diagnosis not present

## 2016-05-15 DIAGNOSIS — H5461 Unqualified visual loss, right eye, normal vision left eye: Secondary | ICD-10-CM | POA: Diagnosis not present

## 2016-05-15 DIAGNOSIS — Z7982 Long term (current) use of aspirin: Secondary | ICD-10-CM | POA: Diagnosis not present

## 2016-05-15 DIAGNOSIS — K219 Gastro-esophageal reflux disease without esophagitis: Secondary | ICD-10-CM | POA: Diagnosis not present

## 2016-05-15 DIAGNOSIS — L03116 Cellulitis of left lower limb: Secondary | ICD-10-CM | POA: Diagnosis not present

## 2016-05-15 DIAGNOSIS — I129 Hypertensive chronic kidney disease with stage 1 through stage 4 chronic kidney disease, or unspecified chronic kidney disease: Secondary | ICD-10-CM | POA: Diagnosis not present

## 2016-05-15 DIAGNOSIS — L02612 Cutaneous abscess of left foot: Secondary | ICD-10-CM | POA: Diagnosis not present

## 2016-05-15 DIAGNOSIS — I2510 Atherosclerotic heart disease of native coronary artery without angina pectoris: Secondary | ICD-10-CM | POA: Diagnosis not present

## 2016-05-16 DIAGNOSIS — L03116 Cellulitis of left lower limb: Secondary | ICD-10-CM | POA: Diagnosis not present

## 2016-05-16 DIAGNOSIS — M109 Gout, unspecified: Secondary | ICD-10-CM | POA: Diagnosis not present

## 2016-05-16 DIAGNOSIS — R262 Difficulty in walking, not elsewhere classified: Secondary | ICD-10-CM | POA: Diagnosis not present

## 2016-05-16 DIAGNOSIS — L02612 Cutaneous abscess of left foot: Secondary | ICD-10-CM | POA: Diagnosis not present

## 2016-05-16 DIAGNOSIS — J441 Chronic obstructive pulmonary disease with (acute) exacerbation: Secondary | ICD-10-CM | POA: Diagnosis not present

## 2016-05-16 DIAGNOSIS — S91302S Unspecified open wound, left foot, sequela: Secondary | ICD-10-CM | POA: Diagnosis not present

## 2016-05-16 DIAGNOSIS — N289 Disorder of kidney and ureter, unspecified: Secondary | ICD-10-CM | POA: Diagnosis not present

## 2016-05-16 DIAGNOSIS — Z7982 Long term (current) use of aspirin: Secondary | ICD-10-CM | POA: Diagnosis not present

## 2016-05-16 DIAGNOSIS — G8929 Other chronic pain: Secondary | ICD-10-CM | POA: Diagnosis not present

## 2016-05-16 DIAGNOSIS — K219 Gastro-esophageal reflux disease without esophagitis: Secondary | ICD-10-CM | POA: Diagnosis not present

## 2016-05-16 DIAGNOSIS — H5461 Unqualified visual loss, right eye, normal vision left eye: Secondary | ICD-10-CM | POA: Diagnosis not present

## 2016-05-16 DIAGNOSIS — I129 Hypertensive chronic kidney disease with stage 1 through stage 4 chronic kidney disease, or unspecified chronic kidney disease: Secondary | ICD-10-CM | POA: Diagnosis not present

## 2016-05-16 DIAGNOSIS — M1990 Unspecified osteoarthritis, unspecified site: Secondary | ICD-10-CM | POA: Diagnosis not present

## 2016-05-16 DIAGNOSIS — N189 Chronic kidney disease, unspecified: Secondary | ICD-10-CM | POA: Diagnosis not present

## 2016-05-16 DIAGNOSIS — L03119 Cellulitis of unspecified part of limb: Secondary | ICD-10-CM | POA: Diagnosis not present

## 2016-05-16 DIAGNOSIS — I2510 Atherosclerotic heart disease of native coronary artery without angina pectoris: Secondary | ICD-10-CM | POA: Diagnosis not present

## 2016-05-19 DIAGNOSIS — K219 Gastro-esophageal reflux disease without esophagitis: Secondary | ICD-10-CM | POA: Diagnosis not present

## 2016-05-19 DIAGNOSIS — G8929 Other chronic pain: Secondary | ICD-10-CM | POA: Diagnosis not present

## 2016-05-19 DIAGNOSIS — M1990 Unspecified osteoarthritis, unspecified site: Secondary | ICD-10-CM | POA: Diagnosis not present

## 2016-05-19 DIAGNOSIS — H5461 Unqualified visual loss, right eye, normal vision left eye: Secondary | ICD-10-CM | POA: Diagnosis not present

## 2016-05-19 DIAGNOSIS — L03116 Cellulitis of left lower limb: Secondary | ICD-10-CM | POA: Diagnosis not present

## 2016-05-19 DIAGNOSIS — I2510 Atherosclerotic heart disease of native coronary artery without angina pectoris: Secondary | ICD-10-CM | POA: Diagnosis not present

## 2016-05-19 DIAGNOSIS — Z7982 Long term (current) use of aspirin: Secondary | ICD-10-CM | POA: Diagnosis not present

## 2016-05-19 DIAGNOSIS — N189 Chronic kidney disease, unspecified: Secondary | ICD-10-CM | POA: Diagnosis not present

## 2016-05-19 DIAGNOSIS — L02612 Cutaneous abscess of left foot: Secondary | ICD-10-CM | POA: Diagnosis not present

## 2016-05-19 DIAGNOSIS — J441 Chronic obstructive pulmonary disease with (acute) exacerbation: Secondary | ICD-10-CM | POA: Diagnosis not present

## 2016-05-19 DIAGNOSIS — I129 Hypertensive chronic kidney disease with stage 1 through stage 4 chronic kidney disease, or unspecified chronic kidney disease: Secondary | ICD-10-CM | POA: Diagnosis not present

## 2016-05-22 DIAGNOSIS — H5461 Unqualified visual loss, right eye, normal vision left eye: Secondary | ICD-10-CM | POA: Diagnosis not present

## 2016-05-22 DIAGNOSIS — L03116 Cellulitis of left lower limb: Secondary | ICD-10-CM | POA: Diagnosis not present

## 2016-05-22 DIAGNOSIS — K219 Gastro-esophageal reflux disease without esophagitis: Secondary | ICD-10-CM | POA: Diagnosis not present

## 2016-05-22 DIAGNOSIS — L02612 Cutaneous abscess of left foot: Secondary | ICD-10-CM | POA: Diagnosis not present

## 2016-05-22 DIAGNOSIS — I2510 Atherosclerotic heart disease of native coronary artery without angina pectoris: Secondary | ICD-10-CM | POA: Diagnosis not present

## 2016-05-22 DIAGNOSIS — J441 Chronic obstructive pulmonary disease with (acute) exacerbation: Secondary | ICD-10-CM | POA: Diagnosis not present

## 2016-05-22 DIAGNOSIS — I129 Hypertensive chronic kidney disease with stage 1 through stage 4 chronic kidney disease, or unspecified chronic kidney disease: Secondary | ICD-10-CM | POA: Diagnosis not present

## 2016-05-22 DIAGNOSIS — G8929 Other chronic pain: Secondary | ICD-10-CM | POA: Diagnosis not present

## 2016-05-22 DIAGNOSIS — M1990 Unspecified osteoarthritis, unspecified site: Secondary | ICD-10-CM | POA: Diagnosis not present

## 2016-05-22 DIAGNOSIS — Z7982 Long term (current) use of aspirin: Secondary | ICD-10-CM | POA: Diagnosis not present

## 2016-05-22 DIAGNOSIS — N189 Chronic kidney disease, unspecified: Secondary | ICD-10-CM | POA: Diagnosis not present

## 2016-05-23 ENCOUNTER — Ambulatory Visit (INDEPENDENT_AMBULATORY_CARE_PROVIDER_SITE_OTHER): Payer: Medicare Other | Admitting: *Deleted

## 2016-05-23 DIAGNOSIS — K219 Gastro-esophageal reflux disease without esophagitis: Secondary | ICD-10-CM | POA: Diagnosis not present

## 2016-05-23 DIAGNOSIS — L02612 Cutaneous abscess of left foot: Secondary | ICD-10-CM | POA: Diagnosis not present

## 2016-05-23 DIAGNOSIS — I129 Hypertensive chronic kidney disease with stage 1 through stage 4 chronic kidney disease, or unspecified chronic kidney disease: Secondary | ICD-10-CM | POA: Diagnosis not present

## 2016-05-23 DIAGNOSIS — Z7982 Long term (current) use of aspirin: Secondary | ICD-10-CM | POA: Diagnosis not present

## 2016-05-23 DIAGNOSIS — N189 Chronic kidney disease, unspecified: Secondary | ICD-10-CM | POA: Diagnosis not present

## 2016-05-23 DIAGNOSIS — H5461 Unqualified visual loss, right eye, normal vision left eye: Secondary | ICD-10-CM | POA: Diagnosis not present

## 2016-05-23 DIAGNOSIS — J441 Chronic obstructive pulmonary disease with (acute) exacerbation: Secondary | ICD-10-CM | POA: Diagnosis not present

## 2016-05-23 DIAGNOSIS — L03116 Cellulitis of left lower limb: Secondary | ICD-10-CM | POA: Diagnosis not present

## 2016-05-23 DIAGNOSIS — G8929 Other chronic pain: Secondary | ICD-10-CM | POA: Diagnosis not present

## 2016-05-23 DIAGNOSIS — I2510 Atherosclerotic heart disease of native coronary artery without angina pectoris: Secondary | ICD-10-CM | POA: Diagnosis not present

## 2016-05-23 DIAGNOSIS — M1990 Unspecified osteoarthritis, unspecified site: Secondary | ICD-10-CM | POA: Diagnosis not present

## 2016-05-23 DIAGNOSIS — I639 Cerebral infarction, unspecified: Secondary | ICD-10-CM | POA: Diagnosis not present

## 2016-05-23 NOTE — Progress Notes (Signed)
Carelink Summary Report / Loop Recorder 

## 2016-05-25 LAB — CUP PACEART REMOTE DEVICE CHECK
MDC IDC PG IMPLANT DT: 20150218
MDC IDC SESS DTM: 20171011084046

## 2016-05-25 NOTE — Progress Notes (Signed)
Carelink summary report received. Battery status OK. Normal device function. No new symptom episodes, tachy episodes, brady, or pause episodes. No new AF episodes. Monthly summary reports and ROV/PRN 

## 2016-05-27 DIAGNOSIS — Z7982 Long term (current) use of aspirin: Secondary | ICD-10-CM | POA: Diagnosis not present

## 2016-05-27 DIAGNOSIS — I129 Hypertensive chronic kidney disease with stage 1 through stage 4 chronic kidney disease, or unspecified chronic kidney disease: Secondary | ICD-10-CM | POA: Diagnosis not present

## 2016-05-27 DIAGNOSIS — L02612 Cutaneous abscess of left foot: Secondary | ICD-10-CM | POA: Diagnosis not present

## 2016-05-27 DIAGNOSIS — M1990 Unspecified osteoarthritis, unspecified site: Secondary | ICD-10-CM | POA: Diagnosis not present

## 2016-05-27 DIAGNOSIS — J441 Chronic obstructive pulmonary disease with (acute) exacerbation: Secondary | ICD-10-CM | POA: Diagnosis not present

## 2016-05-27 DIAGNOSIS — I2510 Atherosclerotic heart disease of native coronary artery without angina pectoris: Secondary | ICD-10-CM | POA: Diagnosis not present

## 2016-05-27 DIAGNOSIS — N189 Chronic kidney disease, unspecified: Secondary | ICD-10-CM | POA: Diagnosis not present

## 2016-05-27 DIAGNOSIS — K219 Gastro-esophageal reflux disease without esophagitis: Secondary | ICD-10-CM | POA: Diagnosis not present

## 2016-05-27 DIAGNOSIS — L03116 Cellulitis of left lower limb: Secondary | ICD-10-CM | POA: Diagnosis not present

## 2016-05-27 DIAGNOSIS — G8929 Other chronic pain: Secondary | ICD-10-CM | POA: Diagnosis not present

## 2016-05-27 DIAGNOSIS — H5461 Unqualified visual loss, right eye, normal vision left eye: Secondary | ICD-10-CM | POA: Diagnosis not present

## 2016-05-28 DIAGNOSIS — N289 Disorder of kidney and ureter, unspecified: Secondary | ICD-10-CM | POA: Diagnosis not present

## 2016-05-28 DIAGNOSIS — J449 Chronic obstructive pulmonary disease, unspecified: Secondary | ICD-10-CM | POA: Diagnosis not present

## 2016-05-28 DIAGNOSIS — M549 Dorsalgia, unspecified: Secondary | ICD-10-CM | POA: Diagnosis not present

## 2016-05-28 DIAGNOSIS — I10 Essential (primary) hypertension: Secondary | ICD-10-CM | POA: Diagnosis not present

## 2016-05-28 DIAGNOSIS — M109 Gout, unspecified: Secondary | ICD-10-CM | POA: Diagnosis not present

## 2016-06-02 DIAGNOSIS — L02612 Cutaneous abscess of left foot: Secondary | ICD-10-CM | POA: Diagnosis not present

## 2016-06-02 DIAGNOSIS — J441 Chronic obstructive pulmonary disease with (acute) exacerbation: Secondary | ICD-10-CM | POA: Diagnosis not present

## 2016-06-02 DIAGNOSIS — L03116 Cellulitis of left lower limb: Secondary | ICD-10-CM | POA: Diagnosis not present

## 2016-06-02 DIAGNOSIS — G8929 Other chronic pain: Secondary | ICD-10-CM | POA: Diagnosis not present

## 2016-06-02 DIAGNOSIS — N189 Chronic kidney disease, unspecified: Secondary | ICD-10-CM | POA: Diagnosis not present

## 2016-06-02 DIAGNOSIS — H5461 Unqualified visual loss, right eye, normal vision left eye: Secondary | ICD-10-CM | POA: Diagnosis not present

## 2016-06-02 DIAGNOSIS — M1990 Unspecified osteoarthritis, unspecified site: Secondary | ICD-10-CM | POA: Diagnosis not present

## 2016-06-02 DIAGNOSIS — Z7982 Long term (current) use of aspirin: Secondary | ICD-10-CM | POA: Diagnosis not present

## 2016-06-02 DIAGNOSIS — I2510 Atherosclerotic heart disease of native coronary artery without angina pectoris: Secondary | ICD-10-CM | POA: Diagnosis not present

## 2016-06-02 DIAGNOSIS — I129 Hypertensive chronic kidney disease with stage 1 through stage 4 chronic kidney disease, or unspecified chronic kidney disease: Secondary | ICD-10-CM | POA: Diagnosis not present

## 2016-06-02 DIAGNOSIS — K219 Gastro-esophageal reflux disease without esophagitis: Secondary | ICD-10-CM | POA: Diagnosis not present

## 2016-06-03 DIAGNOSIS — I2510 Atherosclerotic heart disease of native coronary artery without angina pectoris: Secondary | ICD-10-CM | POA: Diagnosis not present

## 2016-06-03 DIAGNOSIS — J441 Chronic obstructive pulmonary disease with (acute) exacerbation: Secondary | ICD-10-CM | POA: Diagnosis not present

## 2016-06-03 DIAGNOSIS — H5461 Unqualified visual loss, right eye, normal vision left eye: Secondary | ICD-10-CM | POA: Diagnosis not present

## 2016-06-03 DIAGNOSIS — K219 Gastro-esophageal reflux disease without esophagitis: Secondary | ICD-10-CM | POA: Diagnosis not present

## 2016-06-03 DIAGNOSIS — Z7982 Long term (current) use of aspirin: Secondary | ICD-10-CM | POA: Diagnosis not present

## 2016-06-03 DIAGNOSIS — L02612 Cutaneous abscess of left foot: Secondary | ICD-10-CM | POA: Diagnosis not present

## 2016-06-03 DIAGNOSIS — I129 Hypertensive chronic kidney disease with stage 1 through stage 4 chronic kidney disease, or unspecified chronic kidney disease: Secondary | ICD-10-CM | POA: Diagnosis not present

## 2016-06-03 DIAGNOSIS — N189 Chronic kidney disease, unspecified: Secondary | ICD-10-CM | POA: Diagnosis not present

## 2016-06-03 DIAGNOSIS — L03116 Cellulitis of left lower limb: Secondary | ICD-10-CM | POA: Diagnosis not present

## 2016-06-03 DIAGNOSIS — G8929 Other chronic pain: Secondary | ICD-10-CM | POA: Diagnosis not present

## 2016-06-03 DIAGNOSIS — M1990 Unspecified osteoarthritis, unspecified site: Secondary | ICD-10-CM | POA: Diagnosis not present

## 2016-06-17 DIAGNOSIS — M109 Gout, unspecified: Secondary | ICD-10-CM | POA: Diagnosis not present

## 2016-06-18 ENCOUNTER — Ambulatory Visit (INDEPENDENT_AMBULATORY_CARE_PROVIDER_SITE_OTHER): Payer: Medicare Other | Admitting: Internal Medicine

## 2016-06-18 ENCOUNTER — Encounter: Payer: Self-pay | Admitting: Internal Medicine

## 2016-06-18 VITALS — BP 99/65 | HR 63 | Ht 72.0 in | Wt 269.0 lb

## 2016-06-18 DIAGNOSIS — I161 Hypertensive emergency: Secondary | ICD-10-CM

## 2016-06-18 DIAGNOSIS — I639 Cerebral infarction, unspecified: Secondary | ICD-10-CM

## 2016-06-18 DIAGNOSIS — Z720 Tobacco use: Secondary | ICD-10-CM | POA: Diagnosis not present

## 2016-06-18 LAB — CUP PACEART INCLINIC DEVICE CHECK
Date Time Interrogation Session: 20171206172113
Implantable Pulse Generator Implant Date: 20150218

## 2016-06-18 NOTE — Patient Instructions (Signed)
Medication Instructions:  Your physician recommends that you continue on your current medications as directed. Please refer to the Current Medication list given to you today.   Labwork: None Ordered   Testing/Procedures: None Ordered   Follow-Up: Your physician wants you to follow-up in: 1 year with Dr. Lovena Le. You will receive a reminder letter in the mail two months in advance. If you don't receive a letter, please call our office to schedule the follow-up appointment.   Any Other Special Instructions Will Be Listed Below (If Applicable).  Follow a low purine diet:  A low-purine diet is an eating plan that limits foods with high purine. Purines are a natural substance found in some foods. ... When your body digests purine, it produces a waste product called uric acid. A buildup of uric acid crystals in the joints can cause certain health issues.  Avoid High-Purine Foods:  Alcoholic beverages (all types)  Some fish, seafood and shellfish, including anchovies, sardines, herring, mussels, codfish, scallops, trout and haddock.  Some meats, such as bacon, [place], veal, venison and organ meats like liver.   If you need a refill on your cardiac medications before your next appointment, please call your pharmacy. Marland Kitchen

## 2016-06-18 NOTE — Progress Notes (Addendum)
HPI [name] returns today for follow-up. He has a history of HTN, s/p stroke, s/p ILR, COPD, very sedentary lifestyle, and DM. He smokes less than a pack of cigarettes a day, down from 1 PPD.  His appetite is still good. He has become more sedentary. He denies syncope or near syncope or dizziness. He has had worsening dyspnea. He can only walk 50-100 feet and has to stop and rest.  No Known Allergies   Current Outpatient Prescriptions  Medication Sig Dispense Refill  . albuterol (PROVENTIL HFA;VENTOLIN HFA) 108 (90 BASE) MCG/ACT inhaler Inhale 2 puffs into the lungs every 6 (six) hours as needed for wheezing or shortness of breath.    . allopurinol (ZYLOPRIM) 300 MG tablet Take 1 tablet by mouth daily.  12  . aspirin 325 MG tablet Take 1 tablet (325 mg total) by mouth daily. 30 tablet 0  . bisacodyl (DULCOLAX) 5 MG EC tablet Take 5 mg by mouth daily as needed for moderate constipation.    . cetirizine (ZYRTEC) 10 MG tablet Take 10 mg by mouth daily as needed for allergies.    . furosemide (LASIX) 40 MG tablet Take 1 tablet by mouth daily.  0  . gabapentin (NEURONTIN) 100 MG capsule Take 1 capsule by mouth daily.  1  . lisinopril (PRINIVIL,ZESTRIL) 40 MG tablet Take 20 mg by mouth daily.    . methylphenidate (RITALIN) 10 MG tablet Take 10 mg by mouth 3 (three) times daily with meals. Unknown prn, pt stated he takes when he needs it for seziures    . nitroGLYCERIN (NITROSTAT) 0.4 MG SL tablet Place 0.4 mg under the tongue every 5 (five) minutes as needed for chest pain.    Marland Kitchen omeprazole (PRILOSEC) 20 MG capsule Take 20 mg by mouth daily.    Marland Kitchen oxyCODONE (OXY IR/ROXICODONE) 5 MG immediate release tablet Take 1 tablet by mouth 2 (two) times daily as needed.  0  . Potassium Chloride ER 20 MEQ TBCR Take 1 tablet by mouth daily.  6  . tamsulosin (FLOMAX) 0.4 MG CAPS capsule Take 0.4 mg by mouth at bedtime.    . triamcinolone ointment (KENALOG) 0.5 % Apply 1 application topically 2 (two)  times daily. Apply to affected area    . venlafaxine XR (EFFEXOR-XR) 150 MG 24 hr capsule Take 150 mg by mouth 2 (two) times daily.    Marland Kitchen venlafaxine XR (EFFEXOR-XR) 37.5 MG 24 hr capsule Take 37.5 mg by mouth every evening.     . vitamin C (ASCORBIC ACID) 500 MG tablet Take 500 mg by mouth daily.     No current facility-administered medications for this visit.      Past Medical History:  Diagnosis Date  . Acid reflux   . Anxiety   . Arthritis   . Catalepsy   . Coronary artery disease   . Hypertension   . Narcolepsy   . Seizures ([place])   . Sleep apnea   . Sleep apnea   . Stroke ([place]) 08/27/2013   [facility] notes 09/01/2013    ROS:   All systems reviewed and negative except as noted in the HPI.   Past Surgical History:  Procedure Laterality Date  . ABDOMINAL AORTIC ANEURYSM REPAIR  02/2013   [facility] notes 09/01/2013  . arm surgery     . LOOP RECORDER IMPLANT  08/31/13   MDT LinQ implanted by Dr Lovena Le for cryptogenic stroke  . TEE WITHOUT CARDIOVERSION N/A 08/31/2013   Procedure: TRANSESOPHAGEAL ECHOCARDIOGRAM (  TEE);  Surgeon: Thayer Headings, MD;  Location: [facility];  Service: Cardiovascular;  Laterality: N/A;  . TONSILLECTOMY       Family History  Problem Relation Age of Onset  . Heart disease Mother   . Heart failure Father   . Heart disease Father      Social History   Social History  . Marital status: Single    Spouse name: N/A  . Number of children: N/A  . Years of education: N/A   Occupational History  . Not on file.   Social History Main Topics  . Smoking status: Current Every Day Smoker    Packs/day: 1.00    Types: Cigarettes    Start date: 06/18/1955  . Smokeless tobacco: Never Used  . Alcohol use No  . Drug use: No  . Sexual activity: Not on file   Other Topics Concern  . Not on file   Social History Narrative  . No narrative on file     BP 99/65   Pulse 63   Ht 6' (1.829 m)   Wt 269 lb (122 kg)   BMI 36.48 kg/m   Physical  Exam:  Chronically ill appearing 81 year old man, NAD HEENT: Unremarkable Neck:  6 cm JVD, no thyromegally, no bruits Back:  No CVA tenderness Lungs:  Clear with no wheezes, rales, or rhonchi. HEART:  Regular rate rhythm, no murmurs, no rubs, no clicks Abd:  soft, positive bowel sounds, no organomegally, no rebound, no guarding Ext:  2 plus pulses, no edema, no cyanosis, no clubbing Skin:  No rashes no nodules Neuro:  CN II through XII intact, motor grossly intact   DEVICE  Normal ILR function.  See PaceArt for details.   Pulse oximetry - the patient walked 50 feet with oxygen sats in the mid 80's on room air.  Assess/Plan: 1. Cryptogenic stroke - etiology is unclear still. He has had no atrial fib 2. COPD - his oxygen level is a bit concerning and probably a portion of why he is sob. I have think he will need a blood gas. He may well need supplemental oxygen. He will continue bronchodilators. 3. ILR - his medtronic ILR demonstrates no atrial fib. His activity is minimal. He sits all day. 4. Tobacco abuse - I discussed the importance of smoking cessation. He states he will try and stop smoking.   Mikle Bosworth.D.

## 2016-06-19 ENCOUNTER — Telehealth: Payer: Self-pay | Admitting: Cardiology

## 2016-06-19 NOTE — Telephone Encounter (Signed)
LMOVM requesting that pt send manual transmission b/c home monitor has not updated in at least 14 days.    

## 2016-06-23 ENCOUNTER — Ambulatory Visit (INDEPENDENT_AMBULATORY_CARE_PROVIDER_SITE_OTHER): Payer: Medicare Other | Admitting: *Deleted

## 2016-06-23 DIAGNOSIS — I639 Cerebral infarction, unspecified: Secondary | ICD-10-CM | POA: Diagnosis not present

## 2016-06-23 NOTE — Progress Notes (Signed)
Carelink Summary Report / Loop Recorder 

## 2016-06-26 DIAGNOSIS — I5032 Chronic diastolic (congestive) heart failure: Secondary | ICD-10-CM | POA: Diagnosis not present

## 2016-06-26 DIAGNOSIS — J449 Chronic obstructive pulmonary disease, unspecified: Secondary | ICD-10-CM | POA: Diagnosis not present

## 2016-06-26 DIAGNOSIS — M1990 Unspecified osteoarthritis, unspecified site: Secondary | ICD-10-CM | POA: Diagnosis not present

## 2016-06-26 DIAGNOSIS — M109 Gout, unspecified: Secondary | ICD-10-CM | POA: Diagnosis not present

## 2016-06-27 ENCOUNTER — Encounter: Payer: Self-pay | Admitting: Cardiology

## 2016-06-30 DIAGNOSIS — R05 Cough: Secondary | ICD-10-CM | POA: Diagnosis not present

## 2016-06-30 DIAGNOSIS — R1084 Generalized abdominal pain: Secondary | ICD-10-CM | POA: Diagnosis not present

## 2016-06-30 DIAGNOSIS — J209 Acute bronchitis, unspecified: Secondary | ICD-10-CM | POA: Diagnosis not present

## 2016-06-30 DIAGNOSIS — J441 Chronic obstructive pulmonary disease with (acute) exacerbation: Secondary | ICD-10-CM | POA: Diagnosis not present

## 2016-06-30 DIAGNOSIS — R0902 Hypoxemia: Secondary | ICD-10-CM | POA: Diagnosis not present

## 2016-06-30 DIAGNOSIS — R259 Unspecified abnormal involuntary movements: Secondary | ICD-10-CM | POA: Diagnosis not present

## 2016-07-01 DIAGNOSIS — Z79891 Long term (current) use of opiate analgesic: Secondary | ICD-10-CM | POA: Diagnosis not present

## 2016-07-01 DIAGNOSIS — Z7982 Long term (current) use of aspirin: Secondary | ICD-10-CM | POA: Diagnosis not present

## 2016-07-01 DIAGNOSIS — J9601 Acute respiratory failure with hypoxia: Secondary | ICD-10-CM | POA: Diagnosis not present

## 2016-07-01 DIAGNOSIS — J440 Chronic obstructive pulmonary disease with acute lower respiratory infection: Secondary | ICD-10-CM | POA: Diagnosis not present

## 2016-07-01 DIAGNOSIS — I10 Essential (primary) hypertension: Secondary | ICD-10-CM | POA: Diagnosis not present

## 2016-07-01 DIAGNOSIS — A419 Sepsis, unspecified organism: Secondary | ICD-10-CM | POA: Diagnosis not present

## 2016-07-01 DIAGNOSIS — J208 Acute bronchitis due to other specified organisms: Secondary | ICD-10-CM | POA: Diagnosis not present

## 2016-07-01 DIAGNOSIS — I2510 Atherosclerotic heart disease of native coronary artery without angina pectoris: Secondary | ICD-10-CM | POA: Diagnosis not present

## 2016-07-01 DIAGNOSIS — E1165 Type 2 diabetes mellitus with hyperglycemia: Secondary | ICD-10-CM | POA: Diagnosis not present

## 2016-07-01 DIAGNOSIS — N400 Enlarged prostate without lower urinary tract symptoms: Secondary | ICD-10-CM | POA: Diagnosis not present

## 2016-07-01 DIAGNOSIS — E119 Type 2 diabetes mellitus without complications: Secondary | ICD-10-CM

## 2016-07-01 DIAGNOSIS — R0902 Hypoxemia: Secondary | ICD-10-CM | POA: Diagnosis not present

## 2016-07-01 DIAGNOSIS — Z955 Presence of coronary angioplasty implant and graft: Secondary | ICD-10-CM | POA: Diagnosis not present

## 2016-07-01 DIAGNOSIS — R05 Cough: Secondary | ICD-10-CM | POA: Diagnosis not present

## 2016-07-01 DIAGNOSIS — Z9861 Coronary angioplasty status: Secondary | ICD-10-CM | POA: Diagnosis not present

## 2016-07-01 DIAGNOSIS — Z79899 Other long term (current) drug therapy: Secondary | ICD-10-CM | POA: Diagnosis not present

## 2016-07-01 DIAGNOSIS — Z7952 Long term (current) use of systemic steroids: Secondary | ICD-10-CM | POA: Diagnosis not present

## 2016-07-01 DIAGNOSIS — Z66 Do not resuscitate: Secondary | ICD-10-CM | POA: Diagnosis not present

## 2016-07-01 DIAGNOSIS — J441 Chronic obstructive pulmonary disease with (acute) exacerbation: Secondary | ICD-10-CM | POA: Diagnosis not present

## 2016-07-01 DIAGNOSIS — J209 Acute bronchitis, unspecified: Secondary | ICD-10-CM | POA: Diagnosis not present

## 2016-07-09 LAB — CUP PACEART REMOTE DEVICE CHECK
MDC IDC PG IMPLANT DT: 20150218
MDC IDC SESS DTM: 20171110093818

## 2016-07-11 ENCOUNTER — Encounter: Payer: Self-pay | Admitting: Cardiology

## 2016-07-19 DIAGNOSIS — J441 Chronic obstructive pulmonary disease with (acute) exacerbation: Secondary | ICD-10-CM | POA: Diagnosis not present

## 2016-07-19 DIAGNOSIS — J449 Chronic obstructive pulmonary disease, unspecified: Secondary | ICD-10-CM | POA: Diagnosis not present

## 2016-07-19 DIAGNOSIS — M542 Cervicalgia: Secondary | ICD-10-CM | POA: Diagnosis not present

## 2016-07-19 DIAGNOSIS — M25511 Pain in right shoulder: Secondary | ICD-10-CM | POA: Diagnosis not present

## 2016-07-19 DIAGNOSIS — R51 Headache: Secondary | ICD-10-CM | POA: Diagnosis not present

## 2016-07-19 DIAGNOSIS — R0602 Shortness of breath: Secondary | ICD-10-CM | POA: Diagnosis not present

## 2016-07-19 DIAGNOSIS — S0990XA Unspecified injury of head, initial encounter: Secondary | ICD-10-CM | POA: Diagnosis not present

## 2016-07-19 DIAGNOSIS — S199XXA Unspecified injury of neck, initial encounter: Secondary | ICD-10-CM | POA: Diagnosis not present

## 2016-07-19 DIAGNOSIS — S299XXA Unspecified injury of thorax, initial encounter: Secondary | ICD-10-CM | POA: Diagnosis not present

## 2016-07-22 ENCOUNTER — Ambulatory Visit (INDEPENDENT_AMBULATORY_CARE_PROVIDER_SITE_OTHER): Payer: Medicare Other | Admitting: *Deleted

## 2016-07-22 DIAGNOSIS — I639 Cerebral infarction, unspecified: Secondary | ICD-10-CM

## 2016-07-22 NOTE — Progress Notes (Signed)
Carelink Summary Report / Loop Recorder 

## 2016-07-28 DIAGNOSIS — A419 Sepsis, unspecified organism: Secondary | ICD-10-CM | POA: Diagnosis not present

## 2016-07-28 DIAGNOSIS — R531 Weakness: Secondary | ICD-10-CM | POA: Diagnosis not present

## 2016-07-28 DIAGNOSIS — J101 Influenza due to other identified influenza virus with other respiratory manifestations: Secondary | ICD-10-CM | POA: Diagnosis not present

## 2016-07-28 DIAGNOSIS — J9621 Acute and chronic respiratory failure with hypoxia: Secondary | ICD-10-CM | POA: Diagnosis not present

## 2016-07-28 DIAGNOSIS — F17200 Nicotine dependence, unspecified, uncomplicated: Secondary | ICD-10-CM

## 2016-07-28 DIAGNOSIS — Z9981 Dependence on supplemental oxygen: Secondary | ICD-10-CM | POA: Diagnosis not present

## 2016-07-28 DIAGNOSIS — J208 Acute bronchitis due to other specified organisms: Secondary | ICD-10-CM | POA: Diagnosis not present

## 2016-07-28 DIAGNOSIS — M1990 Unspecified osteoarthritis, unspecified site: Secondary | ICD-10-CM | POA: Diagnosis not present

## 2016-07-28 DIAGNOSIS — Z79899 Other long term (current) drug therapy: Secondary | ICD-10-CM | POA: Diagnosis not present

## 2016-07-28 DIAGNOSIS — J441 Chronic obstructive pulmonary disease with (acute) exacerbation: Secondary | ICD-10-CM | POA: Diagnosis not present

## 2016-07-28 DIAGNOSIS — J181 Lobar pneumonia, unspecified organism: Secondary | ICD-10-CM | POA: Diagnosis not present

## 2016-07-28 DIAGNOSIS — I2510 Atherosclerotic heart disease of native coronary artery without angina pectoris: Secondary | ICD-10-CM | POA: Diagnosis not present

## 2016-07-28 DIAGNOSIS — G4730 Sleep apnea, unspecified: Secondary | ICD-10-CM | POA: Diagnosis not present

## 2016-07-28 DIAGNOSIS — I252 Old myocardial infarction: Secondary | ICD-10-CM | POA: Diagnosis not present

## 2016-07-28 DIAGNOSIS — J9601 Acute respiratory failure with hypoxia: Secondary | ICD-10-CM | POA: Diagnosis not present

## 2016-07-28 DIAGNOSIS — J440 Chronic obstructive pulmonary disease with acute lower respiratory infection: Secondary | ICD-10-CM | POA: Diagnosis not present

## 2016-07-28 DIAGNOSIS — J449 Chronic obstructive pulmonary disease, unspecified: Secondary | ICD-10-CM

## 2016-07-28 DIAGNOSIS — M109 Gout, unspecified: Secondary | ICD-10-CM | POA: Diagnosis not present

## 2016-07-28 DIAGNOSIS — Z955 Presence of coronary angioplasty implant and graft: Secondary | ICD-10-CM | POA: Diagnosis not present

## 2016-07-28 DIAGNOSIS — G9341 Metabolic encephalopathy: Secondary | ICD-10-CM | POA: Diagnosis not present

## 2016-07-28 DIAGNOSIS — Z8673 Personal history of transient ischemic attack (TIA), and cerebral infarction without residual deficits: Secondary | ICD-10-CM | POA: Diagnosis not present

## 2016-07-28 DIAGNOSIS — F419 Anxiety disorder, unspecified: Secondary | ICD-10-CM

## 2016-07-28 DIAGNOSIS — N179 Acute kidney failure, unspecified: Secondary | ICD-10-CM | POA: Diagnosis not present

## 2016-07-28 DIAGNOSIS — N400 Enlarged prostate without lower urinary tract symptoms: Secondary | ICD-10-CM

## 2016-07-28 DIAGNOSIS — Z743 Need for continuous supervision: Secondary | ICD-10-CM | POA: Diagnosis not present

## 2016-07-28 DIAGNOSIS — R109 Unspecified abdominal pain: Secondary | ICD-10-CM | POA: Diagnosis not present

## 2016-07-28 DIAGNOSIS — J209 Acute bronchitis, unspecified: Secondary | ICD-10-CM | POA: Diagnosis not present

## 2016-07-28 DIAGNOSIS — R404 Transient alteration of awareness: Secondary | ICD-10-CM | POA: Diagnosis not present

## 2016-07-28 DIAGNOSIS — G47419 Narcolepsy without cataplexy: Secondary | ICD-10-CM | POA: Diagnosis not present

## 2016-07-28 DIAGNOSIS — I10 Essential (primary) hypertension: Secondary | ICD-10-CM

## 2016-07-28 DIAGNOSIS — E860 Dehydration: Secondary | ICD-10-CM

## 2016-07-30 DIAGNOSIS — J181 Lobar pneumonia, unspecified organism: Secondary | ICD-10-CM | POA: Diagnosis not present

## 2016-07-30 DIAGNOSIS — G9341 Metabolic encephalopathy: Secondary | ICD-10-CM | POA: Diagnosis not present

## 2016-07-30 DIAGNOSIS — J209 Acute bronchitis, unspecified: Secondary | ICD-10-CM | POA: Diagnosis not present

## 2016-07-30 DIAGNOSIS — J9621 Acute and chronic respiratory failure with hypoxia: Secondary | ICD-10-CM | POA: Diagnosis not present

## 2016-07-30 DIAGNOSIS — J9601 Acute respiratory failure with hypoxia: Secondary | ICD-10-CM

## 2016-07-30 DIAGNOSIS — J441 Chronic obstructive pulmonary disease with (acute) exacerbation: Secondary | ICD-10-CM

## 2016-08-01 DIAGNOSIS — J441 Chronic obstructive pulmonary disease with (acute) exacerbation: Secondary | ICD-10-CM | POA: Diagnosis not present

## 2016-08-01 DIAGNOSIS — G9341 Metabolic encephalopathy: Secondary | ICD-10-CM | POA: Diagnosis not present

## 2016-08-01 DIAGNOSIS — Z9981 Dependence on supplemental oxygen: Secondary | ICD-10-CM | POA: Diagnosis not present

## 2016-08-01 DIAGNOSIS — R262 Difficulty in walking, not elsewhere classified: Secondary | ICD-10-CM | POA: Diagnosis not present

## 2016-08-01 DIAGNOSIS — J9601 Acute respiratory failure with hypoxia: Secondary | ICD-10-CM | POA: Diagnosis not present

## 2016-08-01 DIAGNOSIS — J9621 Acute and chronic respiratory failure with hypoxia: Secondary | ICD-10-CM | POA: Diagnosis not present

## 2016-08-01 DIAGNOSIS — J181 Lobar pneumonia, unspecified organism: Secondary | ICD-10-CM | POA: Diagnosis not present

## 2016-08-01 DIAGNOSIS — J189 Pneumonia, unspecified organism: Secondary | ICD-10-CM | POA: Diagnosis not present

## 2016-08-01 DIAGNOSIS — R109 Unspecified abdominal pain: Secondary | ICD-10-CM | POA: Diagnosis not present

## 2016-08-01 DIAGNOSIS — Z743 Need for continuous supervision: Secondary | ICD-10-CM | POA: Diagnosis not present

## 2016-08-01 DIAGNOSIS — I119 Hypertensive heart disease without heart failure: Secondary | ICD-10-CM | POA: Diagnosis not present

## 2016-08-01 DIAGNOSIS — J208 Acute bronchitis due to other specified organisms: Secondary | ICD-10-CM | POA: Diagnosis not present

## 2016-08-04 DIAGNOSIS — R262 Difficulty in walking, not elsewhere classified: Secondary | ICD-10-CM | POA: Diagnosis not present

## 2016-08-04 DIAGNOSIS — I119 Hypertensive heart disease without heart failure: Secondary | ICD-10-CM | POA: Diagnosis not present

## 2016-08-04 DIAGNOSIS — J189 Pneumonia, unspecified organism: Secondary | ICD-10-CM | POA: Diagnosis not present

## 2016-08-04 DIAGNOSIS — J9621 Acute and chronic respiratory failure with hypoxia: Secondary | ICD-10-CM | POA: Diagnosis not present

## 2016-08-07 ENCOUNTER — Telehealth: Payer: Self-pay | Admitting: Cardiology

## 2016-08-07 NOTE — Telephone Encounter (Signed)
LMOVM requesting that pt send manual transmission b/c home monitor has not updated in at least 14 days.    

## 2016-08-11 DIAGNOSIS — S93491A Sprain of other ligament of right ankle, initial encounter: Secondary | ICD-10-CM | POA: Diagnosis not present

## 2016-08-12 LAB — CUP PACEART REMOTE DEVICE CHECK
Date Time Interrogation Session: 20171210103927
MDC IDC PG IMPLANT DT: 20150218

## 2016-08-12 NOTE — Progress Notes (Signed)
Carelink summary report received. Battery status OK. Normal device function. No new symptom episodes, tachy episodes, brady, or pause episodes. No new AF episodes. Monthly summary reports and ROV/PRN 

## 2016-08-13 ENCOUNTER — Encounter: Payer: Self-pay | Admitting: Cardiology

## 2016-08-21 ENCOUNTER — Encounter: Payer: Medicare Other | Admitting: *Deleted

## 2016-08-22 DIAGNOSIS — I5032 Chronic diastolic (congestive) heart failure: Secondary | ICD-10-CM | POA: Diagnosis not present

## 2016-08-22 DIAGNOSIS — E538 Deficiency of other specified B group vitamins: Secondary | ICD-10-CM | POA: Diagnosis not present

## 2016-08-22 DIAGNOSIS — N289 Disorder of kidney and ureter, unspecified: Secondary | ICD-10-CM | POA: Diagnosis not present

## 2016-08-22 DIAGNOSIS — J189 Pneumonia, unspecified organism: Secondary | ICD-10-CM | POA: Diagnosis not present

## 2016-09-03 DIAGNOSIS — J441 Chronic obstructive pulmonary disease with (acute) exacerbation: Secondary | ICD-10-CM | POA: Diagnosis not present

## 2016-09-03 LAB — CUP PACEART REMOTE DEVICE CHECK
Implantable Pulse Generator Implant Date: 20150218
MDC IDC SESS DTM: 20180109110704

## 2016-09-22 ENCOUNTER — Ambulatory Visit (INDEPENDENT_AMBULATORY_CARE_PROVIDER_SITE_OTHER): Payer: Medicare Other | Admitting: *Deleted

## 2016-09-22 DIAGNOSIS — I639 Cerebral infarction, unspecified: Secondary | ICD-10-CM | POA: Diagnosis not present

## 2016-09-22 DIAGNOSIS — K5900 Constipation, unspecified: Secondary | ICD-10-CM | POA: Diagnosis not present

## 2016-09-22 DIAGNOSIS — K5909 Other constipation: Secondary | ICD-10-CM | POA: Diagnosis not present

## 2016-09-22 DIAGNOSIS — R109 Unspecified abdominal pain: Secondary | ICD-10-CM | POA: Diagnosis not present

## 2016-09-22 DIAGNOSIS — R1084 Generalized abdominal pain: Secondary | ICD-10-CM | POA: Diagnosis not present

## 2016-09-23 NOTE — Progress Notes (Signed)
Carelink Summary Report / Loop Recorder 

## 2016-09-28 LAB — CUP PACEART REMOTE DEVICE CHECK
Date Time Interrogation Session: 20180310110835
MDC IDC PG IMPLANT DT: 20150218

## 2016-09-28 NOTE — Progress Notes (Signed)
Carelink summary report received. Battery status OK. Normal device function. No new symptom episodes, tachy episodes, brady, or pause episodes. No new AF episodes. Monthly summary reports and ROV/PRN 

## 2016-10-01 ENCOUNTER — Inpatient Hospital Stay (HOSPITAL_COMMUNITY): Payer: Medicare Other

## 2016-10-01 ENCOUNTER — Encounter (HOSPITAL_COMMUNITY): Payer: Self-pay | Admitting: Emergency Medicine

## 2016-10-01 ENCOUNTER — Observation Stay (HOSPITAL_COMMUNITY)
Admission: EM | Admit: 2016-10-01 | Discharge: 2016-10-02 | Disposition: A | Discharge status: Other Acute Inpatient Hospital | Payer: Medicare Other | Attending: Vascular Surgery | Admitting: Vascular Surgery

## 2016-10-01 DIAGNOSIS — Z8673 Personal history of transient ischemic attack (TIA), and cerebral infarction without residual deficits: Secondary | ICD-10-CM | POA: Insufficient documentation

## 2016-10-01 DIAGNOSIS — R109 Unspecified abdominal pain: Secondary | ICD-10-CM | POA: Diagnosis present

## 2016-10-01 DIAGNOSIS — Z6836 Body mass index (BMI) 36.0-36.9, adult: Secondary | ICD-10-CM | POA: Insufficient documentation

## 2016-10-01 DIAGNOSIS — R0602 Shortness of breath: Secondary | ICD-10-CM | POA: Diagnosis not present

## 2016-10-01 DIAGNOSIS — Z79899 Other long term (current) drug therapy: Secondary | ICD-10-CM | POA: Insufficient documentation

## 2016-10-01 DIAGNOSIS — I10 Essential (primary) hypertension: Secondary | ICD-10-CM | POA: Diagnosis not present

## 2016-10-01 DIAGNOSIS — K219 Gastro-esophageal reflux disease without esophagitis: Secondary | ICD-10-CM | POA: Diagnosis not present

## 2016-10-01 DIAGNOSIS — F17210 Nicotine dependence, cigarettes, uncomplicated: Secondary | ICD-10-CM | POA: Insufficient documentation

## 2016-10-01 DIAGNOSIS — E669 Obesity, unspecified: Secondary | ICD-10-CM | POA: Diagnosis not present

## 2016-10-01 DIAGNOSIS — R0600 Dyspnea, unspecified: Secondary | ICD-10-CM

## 2016-10-01 DIAGNOSIS — Z9981 Dependence on supplemental oxygen: Secondary | ICD-10-CM | POA: Insufficient documentation

## 2016-10-01 DIAGNOSIS — J449 Chronic obstructive pulmonary disease, unspecified: Secondary | ICD-10-CM | POA: Diagnosis not present

## 2016-10-01 DIAGNOSIS — J441 Chronic obstructive pulmonary disease with (acute) exacerbation: Secondary | ICD-10-CM | POA: Diagnosis not present

## 2016-10-01 DIAGNOSIS — R0609 Other forms of dyspnea: Secondary | ICD-10-CM

## 2016-10-01 DIAGNOSIS — R1084 Generalized abdominal pain: Principal | ICD-10-CM

## 2016-10-01 LAB — CREATININE, SERUM: Creatinine, Ser: 1.09 mg/dL (ref 0.61–1.24)

## 2016-10-01 LAB — CBC
HCT: 34.8 % — ABNORMAL LOW (ref 39.0–52.0)
Hemoglobin: 10.7 g/dL — ABNORMAL LOW (ref 13.0–17.0)
MCH: 26.4 pg (ref 26.0–34.0)
MCHC: 30.7 g/dL (ref 30.0–36.0)
MCV: 85.9 fL (ref 78.0–100.0)
PLATELETS: 325 10*3/uL (ref 150–400)
RBC: 4.05 MIL/uL — ABNORMAL LOW (ref 4.22–5.81)
RDW: 16.2 % — AB (ref 11.5–15.5)
WBC: 9 10*3/uL (ref 4.0–10.5)

## 2016-10-01 LAB — PROTIME-INR
INR: 1.18
Prothrombin Time: 15 seconds (ref 11.4–15.2)

## 2016-10-01 MED ORDER — LABETALOL HCL 5 MG/ML IV SOLN: 10.0000 mg | INTRAVENOUS | Status: DC | PRN

## 2016-10-01 MED ORDER — ONDANSETRON HCL 4 MG/2ML IJ SOLN: 4.0000 mg | Freq: Four times a day (QID) | INTRAMUSCULAR | Status: DC | PRN

## 2016-10-01 MED ORDER — GUAIFENESIN-DM 100-10 MG/5ML PO SYRP: 15.0000 mL | ORAL_SOLUTION | ORAL | Status: DC | PRN

## 2016-10-01 MED ORDER — ALUM & MAG HYDROXIDE-SIMETH 200-200-20 MG/5ML PO SUSP: 15.0000 mL | ORAL | Status: DC | PRN

## 2016-10-01 MED ORDER — SODIUM CHLORIDE 0.9% FLUSH: 3.0000 mL | INTRAVENOUS | Status: DC | PRN

## 2016-10-01 MED ORDER — BISACODYL 5 MG PO TBEC: 5.0000 mg | DELAYED_RELEASE_TABLET | Freq: Every day | ORAL | Status: DC | PRN

## 2016-10-01 MED ORDER — DOCUSATE SODIUM 100 MG PO CAPS
100.0000 mg | ORAL_CAPSULE | Freq: Two times a day (BID) | ORAL | Status: DC
  Administered 2016-10-01 – 2016-10-02 (×3): 100 mg via ORAL
  Filled 2016-10-01 (×3): qty 1

## 2016-10-01 MED ORDER — HEPARIN SODIUM (PORCINE) 5000 UNIT/ML IJ SOLN
5000.0000 [IU] | Freq: Three times a day (TID) | INTRAMUSCULAR | Status: DC
  Administered 2016-10-01 – 2016-10-02 (×4): 5000 [IU] via SUBCUTANEOUS
  Filled 2016-10-01 (×4): qty 1

## 2016-10-01 MED ORDER — FUROSEMIDE 20 MG PO TABS
40.0000 mg | ORAL_TABLET | Freq: Every day | ORAL | Status: DC
  Administered 2016-10-02: 40 mg via ORAL
  Filled 2016-10-01: qty 2

## 2016-10-01 MED ORDER — ALLOPURINOL 300 MG PO TABS
300.0000 mg | ORAL_TABLET | Freq: Every day | ORAL | Status: DC
  Administered 2016-10-02: 300 mg via ORAL
  Filled 2016-10-01: qty 1

## 2016-10-01 MED ORDER — ACETAMINOPHEN 325 MG PO TABS
325.0000 mg | ORAL_TABLET | ORAL | Status: DC | PRN
  Administered 2016-10-02 (×3): 650 mg via ORAL
  Filled 2016-10-01 (×3): qty 2

## 2016-10-01 MED ORDER — GABAPENTIN 100 MG PO CAPS
100.0000 mg | ORAL_CAPSULE | Freq: Every day | ORAL | Status: DC
  Administered 2016-10-02: 100 mg via ORAL
  Filled 2016-10-01: qty 1

## 2016-10-01 MED ORDER — VENLAFAXINE HCL ER 37.5 MG PO CP24
37.5000 mg | ORAL_CAPSULE | Freq: Every evening | ORAL | Status: DC
  Administered 2016-10-02: 37.5 mg via ORAL
  Filled 2016-10-01: qty 1

## 2016-10-01 MED ORDER — VITAMIN C 500 MG PO TABS
500.0000 mg | ORAL_TABLET | Freq: Every day | ORAL | Status: DC
  Administered 2016-10-02: 500 mg via ORAL
  Filled 2016-10-01: qty 1

## 2016-10-01 MED ORDER — PANTOPRAZOLE SODIUM 40 MG PO TBEC
40.0000 mg | DELAYED_RELEASE_TABLET | Freq: Every day | ORAL | Status: DC
  Administered 2016-10-02: 40 mg via ORAL
  Filled 2016-10-01: qty 1

## 2016-10-01 MED ORDER — POTASSIUM CHLORIDE CRYS ER 20 MEQ PO TBCR
20.0000 meq | EXTENDED_RELEASE_TABLET | Freq: Every day | ORAL | Status: DC
  Administered 2016-10-02: 20 meq via ORAL
  Filled 2016-10-01: qty 1

## 2016-10-01 MED ORDER — METOPROLOL TARTRATE 5 MG/5ML IV SOLN: 2.0000 mg | INTRAVENOUS | Status: DC | PRN

## 2016-10-01 MED ORDER — MAGNESIUM HYDROXIDE 400 MG/5ML PO SUSP: 30.0000 mL | Freq: Every day | ORAL | Status: DC | PRN

## 2016-10-01 MED ORDER — ACETAMINOPHEN 325 MG RE SUPP
325.0000 mg | RECTAL | Status: DC | PRN
  Filled 2016-10-01: qty 2

## 2016-10-01 MED ORDER — LISINOPRIL 20 MG PO TABS
20.0000 mg | ORAL_TABLET | Freq: Every day | ORAL | Status: DC
  Administered 2016-10-02: 20 mg via ORAL
  Filled 2016-10-01: qty 1

## 2016-10-01 MED ORDER — VENLAFAXINE HCL ER 150 MG PO CP24
150.0000 mg | ORAL_CAPSULE | Freq: Two times a day (BID) | ORAL | Status: DC
  Administered 2016-10-02 (×2): 150 mg via ORAL
  Filled 2016-10-01: qty 2
  Filled 2016-10-01 (×3): qty 1
  Filled 2016-10-01: qty 2

## 2016-10-01 MED ORDER — NITROGLYCERIN 0.4 MG SL SUBL: 0.4000 mg | SUBLINGUAL_TABLET | SUBLINGUAL | Status: DC | PRN

## 2016-10-01 MED ORDER — METHYLPHENIDATE HCL 5 MG PO TABS: 10.0000 mg | ORAL_TABLET | Freq: Three times a day (TID) | ORAL | Status: DC | PRN

## 2016-10-01 MED ORDER — ASPIRIN 325 MG PO TABS
325.0000 mg | ORAL_TABLET | Freq: Every day | ORAL | Status: DC
  Administered 2016-10-02: 325 mg via ORAL
  Filled 2016-10-01: qty 1

## 2016-10-01 MED ORDER — ALBUTEROL SULFATE (2.5 MG/3ML) 0.083% IN NEBU: 2.5000 mg | INHALATION_SOLUTION | Freq: Four times a day (QID) | RESPIRATORY_TRACT | Status: DC | PRN

## 2016-10-01 MED ORDER — TAMSULOSIN HCL 0.4 MG PO CAPS
0.4000 mg | ORAL_CAPSULE | Freq: Every day | ORAL | Status: DC
  Administered 2016-10-02 (×2): 0.4 mg via ORAL
  Filled 2016-10-01 (×2): qty 1

## 2016-10-01 MED ORDER — HYDRALAZINE HCL 20 MG/ML IJ SOLN: 5.0000 mg | INTRAMUSCULAR | Status: DC | PRN

## 2016-10-01 MED ORDER — SODIUM CHLORIDE 0.9 % IV SOLN: 250.0000 mL | INTRAVENOUS | Status: DC | PRN

## 2016-10-01 MED ORDER — IPRATROPIUM-ALBUTEROL 0.5-2.5 (3) MG/3ML IN SOLN
3.0000 mL | Freq: Once | RESPIRATORY_TRACT | Status: AC
  Administered 2016-10-01: 3 mL via RESPIRATORY_TRACT
  Filled 2016-10-01: qty 3

## 2016-10-01 MED ORDER — SODIUM CHLORIDE 0.9% FLUSH
3.0000 mL | Freq: Two times a day (BID) | INTRAVENOUS | Status: DC
  Administered 2016-10-01 – 2016-10-02 (×3): 3 mL via INTRAVENOUS

## 2016-10-01 NOTE — ED Provider Notes (Signed)
[facility] DEPT Provider Note   CSN: [id number] Arrival date & time: 10/01/16  2034     History   Chief Complaint Chief Complaint  Patient presents with  . Abdominal Pain    HPI Luke Allen is a 82 y.o. male.  HPI  82 year old male with history of COPD on 2 L of home oxygen, CAD, abdominal aortic aneurysm status post endovascular repair in 2014 somewhere in [place], who presents as a transfer from the [facility] with abdominal pain and stranding around his endovascular repair site. Patient developed lower abdominal pain radiating to his back about a week ago. Pain is dull and constant at baseline, but intermittently becomes more severe. No aggravating or alleviating factors. Patient also reports constipation, but states he did have a bowel movement yesterday. Denies blood in his stool. Denies nausea, vomiting, or fevers.  CT abdomen and pelvis was performed at the [facility] that showed that the patient's endovascular stent is patent with stable diameter of the aneurysmal sac. CT did show soft tissue stranding around the infrarenal aorta with stranding of the retroperitoneal fat. Hemoglobin at the outside hospital was 10. Patient transferred here for vascular surgery consultation.  Past Medical History:  Diagnosis Date  . Acid reflux   . Anxiety   . Arthritis   . Catalepsy   . Coronary artery disease   . Hypertension   . Narcolepsy   . Seizures ([place])   . Sleep apnea   . Sleep apnea   . Stroke [facility]) 08/27/2013   [facility] notes 09/01/2013    Patient Active Problem List   Diagnosis Date Noted  . Abdominal pain 10/01/2016  . Tobacco abuse 09/05/2014  . CVA (cerebral infarction) 08/29/2013  . 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 Acid reflux   . Anxiety   . Sleep apnea   . Hypertensive emergency 08/27/2013    Past Surgical History:  Procedure Laterality Date  . ABDOMINAL AORTIC ANEURYSM REPAIR  02/2013   [facility] notes 09/01/2013  . arm surgery       . LOOP RECORDER IMPLANT  08/31/13   MDT LinQ implanted by Dr Lovena Le for cryptogenic stroke  . TEE WITHOUT CARDIOVERSION N/A 08/31/2013   Procedure: TRANSESOPHAGEAL ECHOCARDIOGRAM (TEE);  Surgeon: Thayer Headings, MD;  Location: [facility];  Service: Cardiovascular;  Laterality: N/A;  . TONSILLECTOMY         Home Medications    Prior to Admission medications   Medication Sig Start Date End Date Taking? Authorizing Provider  albuterol (PROVENTIL HFA;VENTOLIN HFA) 108 (90 BASE) MCG/ACT inhaler Inhale 2 puffs into the lungs every 6 (six) hours as needed for wheezing or shortness of breath.    Historical Provider, MD  allopurinol (ZYLOPRIM) 300 MG tablet Take 1 tablet by mouth daily. 05/29/16   Historical Provider, MD  aspirin 325 MG tablet Take 1 tablet (325 mg total) by mouth daily. 09/01/13   Velvet Bathe, MD  bisacodyl (DULCOLAX) 5 MG EC tablet Take 5 mg by mouth daily as needed for moderate constipation.    Historical Provider, MD  cetirizine (ZYRTEC) 10 MG tablet Take 10 mg by mouth daily as needed for allergies.    Historical Provider, MD  furosemide (LASIX) 40 MG tablet Take 1 tablet by mouth daily. 04/18/16   Historical Provider, MD  gabapentin (NEURONTIN) 100 MG capsule Take 1 capsule by mouth daily. 05/28/16   Historical Provider, MD  lisinopril (PRINIVIL,ZESTRIL) 40 MG tablet Take 20 mg by mouth daily.  Historical Provider, MD  methylphenidate (RITALIN) 10 MG tablet Take 10 mg by mouth 3 (three) times daily with meals. Unknown prn, pt stated he takes when he needs it for seziures    Historical Provider, MD  nitroGLYCERIN (NITROSTAT) 0.4 MG SL tablet Place 0.4 mg under the tongue every 5 (five) minutes as needed for chest pain.    Historical Provider, MD  omeprazole (PRILOSEC) 20 MG capsule Take 20 mg by mouth daily.    Historical Provider, MD  oxyCODONE (OXY IR/ROXICODONE) 5 MG immediate release tablet Take 1 tablet by mouth 2 (two) times daily as needed. 06/10/16   Historical  Provider, MD  Potassium Chloride ER 20 MEQ TBCR Take 1 tablet by mouth daily. 06/07/16   Historical Provider, MD  tamsulosin (FLOMAX) 0.4 MG CAPS capsule Take 0.4 mg by mouth at bedtime.    Historical Provider, MD  triamcinolone ointment (KENALOG) 0.5 % Apply 1 application topically 2 (two) times daily. Apply to affected area    Historical Provider, MD  venlafaxine XR (EFFEXOR-XR) 150 MG 24 hr capsule Take 150 mg by mouth 2 (two) times daily.    Historical Provider, MD  venlafaxine XR (EFFEXOR-XR) 37.5 MG 24 hr capsule Take 37.5 mg by mouth every evening.     Historical Provider, MD  vitamin C (ASCORBIC ACID) 500 MG tablet Take 500 mg by mouth daily.    Historical Provider, MD    Family History Family History  Problem Relation Age of Onset  . Heart disease Mother   . Heart failure Father   . Heart disease Father     Social History Social History  Substance Use Topics  . Smoking status: Current Every Day Smoker    Packs/day: 1.00    Types: Cigarettes    Start date: 06/18/1955  . Smokeless tobacco: Never Used  . Alcohol use No     Allergies   Patient has no known allergies.   Review of Systems Review of Systems  Constitutional: Negative for chills and fever.  HENT: Negative for congestion.   Eyes: Negative for visual disturbance.  Respiratory: Positive for wheezing. Negative for cough and shortness of breath.   Cardiovascular: Negative for chest pain and palpitations.  Gastrointestinal: Positive for abdominal pain and constipation. Negative for abdominal distention, blood in stool, diarrhea, nausea and vomiting.  Genitourinary: Negative for dysuria and hematuria.  Musculoskeletal: Positive for back pain. Negative for arthralgias, joint swelling, myalgias, neck pain and neck stiffness.  Skin: Negative for color change and rash.  Neurological: Negative for dizziness, syncope, weakness, numbness and headaches.  Psychiatric/Behavioral: Negative for agitation, behavioral problems  and confusion.     Physical Exam Updated Vital Signs BP (!) 121/59   Pulse 92   Temp 99.1 F (37.3 C) (Oral)   Resp 20   Ht 6' (1.829 m)   Wt 122.5 kg   SpO2 96%   BMI 36.62 kg/m   Physical Exam  Constitutional: He is oriented to person, place, and time. He appears well-developed and well-nourished. No distress.  obese  HENT:  Head: Normocephalic and atraumatic.  Eyes: Conjunctivae are normal.  Neck: Normal range of motion. Neck supple.  Cardiovascular: Normal rate, regular rhythm, normal heart sounds and intact distal pulses.   No murmur heard. Pulmonary/Chest: Effort normal. No respiratory distress. He has wheezes (diffuse, end-expiratory). He has no rales.  Abdominal: Soft. Bowel sounds are normal. He exhibits no distension. There is tenderness (generalized). There is no guarding.  Musculoskeletal: Normal range of motion. He exhibits  no edema, tenderness or deformity.  Neurological: He is alert and oriented to person, place, and time. He exhibits normal muscle tone.  Intact sensation to light touch in the bilateral LEs  Skin: Skin is warm and dry. He is not diaphoretic.  Psychiatric: He has a normal mood and affect.  Nursing note and vitals reviewed.    ED Treatments / Results  Labs (all labs ordered are listed, but only abnormal results are displayed) Labs Reviewed  CBC - Abnormal; Notable for the following:       Result Value   RBC 4.05 (*)    Hemoglobin 10.7 (*)    HCT 34.8 (*)    RDW 16.2 (*)    All other components within normal limits  PROTIME-INR  CREATININE, SERUM  CBC  URINALYSIS, ROUTINE W REFLEX MICROSCOPIC  COMPREHENSIVE METABOLIC PANEL    EKG  EKG Interpretation None       Radiology Dg Chest 2 View  Result Date: 10/01/2016 CLINICAL DATA:  Subacute onset of shortness of breath and generalized abdominal pain. Initial encounter. EXAM: CHEST  2 VIEW COMPARISON:  Chest radiograph from 07/28/2016 FINDINGS: The lungs are well-aerated. Minimal  left basilar atelectasis is noted. There is no evidence of focal opacification, pleural effusion or pneumothorax. The heart is normal in size; the mediastinal contour is within normal limits. A loop recorder is noted. No acute osseous abnormalities are seen. IMPRESSION: Minimal left basilar atelectasis noted. Lungs otherwise grossly clear. Electronically Signed   By: Garald Balding M.D.   On: 10/01/2016 22:30    Procedures Procedures (including critical care time)  Medications Ordered in ED Medications  alum & mag hydroxide-simeth (MAALOX/MYLANTA) 200-200-20 MG/5ML suspension 15-30 mL (not administered)  pantoprazole (PROTONIX) EC tablet 40 mg (not administered)  labetalol (NORMODYNE,TRANDATE) injection 10 mg (not administered)  hydrALAZINE (APRESOLINE) injection 5 mg (not administered)  metoprolol (LOPRESSOR) injection 2-5 mg (not administered)  heparin injection 5,000 Units (5,000 Units Subcutaneous Given 10/01/16 2225)  sodium chloride flush (NS) 0.9 % injection 3 mL (3 mLs Intravenous Given 10/01/16 2227)  sodium chloride flush (NS) 0.9 % injection 3 mL (not administered)  0.9 %  sodium chloride infusion (not administered)  acetaminophen (TYLENOL) tablet 325-650 mg (not administered)    Or  acetaminophen (TYLENOL) suppository 325-650 mg (not administered)  docusate sodium (COLACE) capsule 100 mg (100 mg Oral Given 10/01/16 2226)  magnesium hydroxide (MILK OF MAGNESIA) suspension 30 mL (not administered)  ondansetron (ZOFRAN) injection 4 mg (not administered)  guaiFENesin-dextromethorphan (ROBITUSSIN DM) 100-10 MG/5ML syrup 15 mL (not administered)  albuterol (PROVENTIL HFA;VENTOLIN HFA) 108 (90 Base) MCG/ACT inhaler 2 puff (not administered)  allopurinol (ZYLOPRIM) tablet 300 mg (not administered)  aspirin tablet 325 mg (not administered)  bisacodyl (DULCOLAX) EC tablet 5 mg (not administered)  furosemide (LASIX) tablet 40 mg (not administered)  gabapentin (NEURONTIN) capsule 100 mg  (not administered)  lisinopril (PRINIVIL,ZESTRIL) tablet 20 mg (not administered)  methylphenidate (RITALIN) tablet 10 mg (not administered)  nitroGLYCERIN (NITROSTAT) SL tablet 0.4 mg (not administered)  Potassium Chloride ER TBCR 1 tablet (not administered)  tamsulosin (FLOMAX) capsule 0.4 mg (not administered)  venlafaxine XR (EFFEXOR-XR) 24 hr capsule 150 mg (not administered)  venlafaxine XR (EFFEXOR-XR) 24 hr capsule 37.5 mg (not administered)  vitamin C (ASCORBIC ACID) tablet 500 mg (not administered)  ipratropium-albuterol (DUONEB) 0.5-2.5 (3) MG/3ML nebulizer solution 3 mL (3 mLs Nebulization Given 10/01/16 2225)     Initial Impression / Assessment and Plan / ED Course  I have reviewed the  triage vital signs and the nursing notes.  Pertinent labs & imaging results that were available during my care of the patient were reviewed by me and considered in my medical decision making (see chart for details).     Generally well-appearing. Afebrile and hemodynamically stable. Patient has generalized tenderness to palpation in the abdomen. Hemoglobin is 10.7 here, which is stable from the outside hospital. He has good distal pulses and legs are neurovascularly intact.  Vascular surgery consulted and evaluated the patient at bedside on his arrival to the ED. He'll be admitted to their service for management of potential complication with his endograft site. He was given a DuoNeb in the ED for some wheezing. Chest x-ray with bibasilar atelectasis but no evidence of pneumonia. Patient admitted in stable condition.  Care of patient overseen by my attending, Dr. Oleta Mouse.  Final Clinical Impressions(s) / ED Diagnoses   Final diagnoses:  Generalized abdominal pain    New Prescriptions New Prescriptions   No medications on file     Jenifer Algernon Huxley, MD 10/01/16 [place] Liu, MD 10/02/16 [phone]

## 2016-10-01 NOTE — ED Triage Notes (Signed)
Per EMS: Pt comes from [place]. Pt c/o Abd pain x 2 weeks, at the site of an old AAA. A&Ox4 .

## 2016-10-01 NOTE — H&P (Signed)
Referring Physician: Ave Filter  Patient name: Luke Allen MRN: [id number] DOB: [birth year] Sex: male  REASON FOR CONSULT: Abdominal pain  HPI: Peregrine Nolt is a 82 y.o. male transferred from [place] for evaluation of abdominal pain with prior history of AAA stent graft.  Difficult to obtain history from patient as he is confused.  I spoke with pt sister by phone to obtain some history.  He lives with a caretaker "Ivin Booty" and I left a message for her to call the ER.  I have also requested med records from the [facility]  Pt states he has had abdominal pain for about 3 weeks but worse today.  He has had some nausea but no vomiting.  Pain usually occurs in the morning but lasted longer today.  He describes it as diffuse.  He denies diarrhea but has had some constipation recently.  Last BM was yesterday.  He does not really know his medical history.  It is mostly from records at [facility] and some [place] notes. Other medical problems include narcolepsy, "mental health issues", CAD, COPD, narcolepsy, sleep apnea, seizures all of which have been stable.  According to pt sister he had AAA repair at [facility] in 2015 and was in the hospital for about a month afterward with psychiatric issues.  Past Medical History:  Diagnosis Date  . Acid reflux   . Anxiety   . Arthritis   . Catalepsy   . Coronary artery disease   . Hypertension   . Narcolepsy   . Seizures ([place])   . Sleep apnea   . Sleep apnea   . Stroke [facility]) 08/27/2013   [facility] Distance notes 09/01/2013   Past Surgical History:  Procedure Laterality Date  . ABDOMINAL AORTIC ANEURYSM REPAIR  02/2013   [facility] Distance notes 09/01/2013  . arm surgery     . LOOP RECORDER IMPLANT  08/31/13   MDT LinQ implanted by Dr Lovena Le for cryptogenic stroke  . TEE WITHOUT CARDIOVERSION N/A 08/31/2013   Procedure: TRANSESOPHAGEAL ECHOCARDIOGRAM (TEE);  Surgeon: Thayer Headings, MD;  Location: [facility] ENDOSCOPY;  Service: Cardiovascular;  Laterality: N/A;  . TONSILLECTOMY       Family History  Problem Relation Age of Onset  . Heart disease Mother   . Heart failure Father   . Heart disease Father     SOCIAL HISTORY: Social History   Social History  . Marital status: Single    Spouse name: N/A  . Number of children: N/A  . Years of education: N/A   Occupational History  . Not on file.   Social History Main Topics  . Smoking status: Current Every Day Smoker    Packs/day: 1.00    Types: Cigarettes    Start date: 06/18/1955  . Smokeless tobacco: Never Used  . Alcohol use No  . Drug use: No  . Sexual activity: Not on file   Other Topics Concern  . Not on file   Social History Narrative  . No narrative on file    No Known Allergies  Current Facility-Administered Medications  Medication Dose Route Frequency Provider Last Rate Last Dose  . 0.9 %  sodium chloride infusion  250 mL Intravenous PRN Elam Dutch, MD      . acetaminophen (TYLENOL) tablet 325-650 mg  325-650 mg Oral Q4H PRN Elam Dutch, MD       Or  . acetaminophen (TYLENOL) suppository 325-650 mg  325-650 mg Rectal Q4H PRN Elam Dutch, MD      .  albuterol (PROVENTIL HFA;VENTOLIN HFA) 108 (90 Base) MCG/ACT inhaler 2 puff  2 puff Inhalation Q6H PRN Elam Dutch, MD      . allopurinol (ZYLOPRIM) tablet 300 mg  300 mg Oral Daily Elam Dutch, MD      . alum & mag hydroxide-simeth (MAALOX/MYLANTA) 200-200-20 MG/5ML suspension 15-30 mL  15-30 mL Oral Q2H PRN Elam Dutch, MD      . aspirin tablet 325 mg  325 mg Oral Daily Elam Dutch, MD      . bisacodyl (DULCOLAX) EC tablet 5 mg  5 mg Oral Daily PRN Elam Dutch, MD      . docusate sodium (COLACE) capsule 100 mg  100 mg Oral BID Elam Dutch, MD      . furosemide (LASIX) tablet 40 mg  40 mg Oral Daily Elam Dutch, MD      . gabapentin (NEURONTIN) capsule 100 mg  100 mg Oral Daily Elam Dutch, MD      . guaiFENesin-dextromethorphan (ROBITUSSIN DM) 100-10 MG/5ML syrup 15 mL  15 mL Oral Q4H  PRN Elam Dutch, MD      . heparin injection 5,000 Units  5,000 Units Subcutaneous Q8H Elam Dutch, MD      . hydrALAZINE (APRESOLINE) injection 5 mg  5 mg Intravenous Q20 Min PRN Elam Dutch, MD      . ipratropium-albuterol (DUONEB) 0.5-2.5 (3) MG/3ML nebulizer solution 3 mL  3 mL Nebulization Once Jenifer Algernon Huxley, MD      . labetalol (NORMODYNE,TRANDATE) injection 10 mg  10 mg Intravenous Q10 min PRN Elam Dutch, MD      . lisinopril (PRINIVIL,ZESTRIL) tablet 20 mg  20 mg Oral Daily Elam Dutch, MD      . magnesium hydroxide (MILK OF MAGNESIA) suspension 30 mL  30 mL Oral Daily PRN Elam Dutch, MD      . Derrill Memo ON 10/02/2016] methylphenidate (RITALIN) tablet 10 mg  10 mg Oral TID WC Elam Dutch, MD      . metoprolol (LOPRESSOR) injection 2-5 mg  2-5 mg Intravenous Q2H PRN Elam Dutch, MD      . nitroGLYCERIN (NITROSTAT) SL tablet 0.4 mg  0.4 mg Sublingual Q5 min PRN Elam Dutch, MD      . ondansetron [facility]) injection 4 mg  4 mg Intravenous Q6H PRN Elam Dutch, MD      . pantoprazole (PROTONIX) EC tablet 40 mg  40 mg Oral Daily Elam Dutch, MD      . Potassium Chloride ER TBCR 1 tablet  1 tablet Oral Daily Elam Dutch, MD      . sodium chloride flush (NS) 0.9 % injection 3 mL  3 mL Intravenous Q12H Elam Dutch, MD      . sodium chloride flush (NS) 0.9 % injection 3 mL  3 mL Intravenous PRN Elam Dutch, MD      . tamsulosin (FLOMAX) capsule 0.4 mg  0.4 mg Oral QHS Elam Dutch, MD      . venlafaxine XR (EFFEXOR-XR) 24 hr capsule 150 mg  150 mg Oral BID Elam Dutch, MD      . venlafaxine XR (EFFEXOR-XR) 24 hr capsule 37.5 mg  37.5 mg Oral QPM Elam Dutch, MD      . vitamin C (ASCORBIC ACID) tablet 500 mg  500 mg Oral Daily Elam Dutch, MD       Current Outpatient  Prescriptions  Medication Sig Dispense Refill  . albuterol (PROVENTIL HFA;VENTOLIN HFA) 108 (90 BASE) MCG/ACT inhaler Inhale 2 puffs into the  lungs every 6 (six) hours as needed for wheezing or shortness of breath.    . allopurinol (ZYLOPRIM) 300 MG tablet Take 1 tablet by mouth daily.  12  . aspirin 325 MG tablet Take 1 tablet (325 mg total) by mouth daily. 30 tablet 0  . bisacodyl (DULCOLAX) 5 MG EC tablet Take 5 mg by mouth daily as needed for moderate constipation.    . cetirizine (ZYRTEC) 10 MG tablet Take 10 mg by mouth daily as needed for allergies.    . furosemide (LASIX) 40 MG tablet Take 1 tablet by mouth daily.  0  . gabapentin (NEURONTIN) 100 MG capsule Take 1 capsule by mouth daily.  1  . lisinopril (PRINIVIL,ZESTRIL) 40 MG tablet Take 20 mg by mouth daily.    . methylphenidate (RITALIN) 10 MG tablet Take 10 mg by mouth 3 (three) times daily with meals. Unknown prn, pt stated he takes when he needs it for seziures    . nitroGLYCERIN (NITROSTAT) 0.4 MG SL tablet Place 0.4 mg under the tongue every 5 (five) minutes as needed for chest pain.    Marland Kitchen omeprazole (PRILOSEC) 20 MG capsule Take 20 mg by mouth daily.    Marland Kitchen oxyCODONE (OXY IR/ROXICODONE) 5 MG immediate release tablet Take 1 tablet by mouth 2 (two) times daily as needed.  0  . Potassium Chloride ER 20 MEQ TBCR Take 1 tablet by mouth daily.  6  . tamsulosin (FLOMAX) 0.4 MG CAPS capsule Take 0.4 mg by mouth at bedtime.    . triamcinolone ointment (KENALOG) 0.5 % Apply 1 application topically 2 (two) times daily. Apply to affected area    . venlafaxine XR (EFFEXOR-XR) 150 MG 24 hr capsule Take 150 mg by mouth 2 (two) times daily.    Marland Kitchen venlafaxine XR (EFFEXOR-XR) 37.5 MG 24 hr capsule Take 37.5 mg by mouth every evening.     . vitamin C (ASCORBIC ACID) 500 MG tablet Take 500 mg by mouth daily.      ROS:   General:  No weight loss, Fever, chills  HEENT: No recent headaches, no nasal bleeding, no visual changes, no sore throat  Neurologic: No dizziness, blackouts, seizures. No recent symptoms of stroke or mini- stroke. No recent episodes of slurred speech, or temporary  blindness.  Cardiac: No recent episodes of chest pain/pressure, no shortness of breath at rest.  + shortness of breath with exertion.  Denies history of atrial fibrillation or irregular heartbeat  Vascular: No history of rest pain in feet.  No history of claudication.  No history of non-healing ulcer, No history of DVT   Pulmonary: No home oxygen, no productive cough, no hemoptysis,  No asthma or wheezing  Musculoskeletal:  [ ]  Arthritis, [ ]  Low back pain,  [ ]  Joint pain  Hematologic:No history of hypercoagulable state.  No history of easy bleeding.  No history of anemia  Gastrointestinal: No hematochezia or melena,  No gastroesophageal reflux, no trouble swallowing  Urinary: [ ]  chronic Kidney disease, [ ]  on HD - [ ]  MWF or [ ]  TTHS, [ ]  Burning with urination, [ ]  Frequent urination, [ ]  Difficulty urinating;   Skin: No rashes  Psychological: No history of anxiety,  No history of depression   Physical Examination  Vitals:   10/01/16 2050 10/01/16 2051  BP: 129/69   Pulse: 88   Resp:  17   Temp: 99.1 F (37.3 C)   TempSrc: Oral   SpO2: 95%   Weight:  270 lb (122.5 kg)  Height:  6' (1.829 m)    Body mass index is 36.62 kg/m.  General:  Alert and oriented, no acute distress HEENT: Normal Neck: No bruit or JVD Pulmonary: Clear to auscultation bilaterally Cardiac: Regular Rate and Rhythm without murmur Abdomen: Soft, non-tender, non-distended, no mass, no scars Skin: No rash Extremity Pulses:  2+ radial, brachial, femoral, dorsalis pedis, posterior tibial pulses bilaterally Musculoskeletal: No deformity or edema  Neurologic: Upper and lower extremity motor 5/5 and symmetric  DATA:  CT from [place] reviewed.  Aneurysm stent graft no evidence of rupture.  Periaortic thickening in the juxtarenal aorta, no fluid or air, mention made in report of prior comparison but this is not available for me to review  No other obvious intraabdominal pathology  ASSESSMENT:   Abdominal pain, confusion.  Possible aortic graft infection but currently stable not in need of emergent procedure.   PLAN:  Will admit tonight.  Try to get records from [facility]. Baseline labs including UA.  Ok to eat.  If AAA repair was at [facility] will most likely transfer there tomorrow if continues to need inpt care.  Spoke with "Ivin Booty" (cell [phone]) who says she has power of attorney. Also spoke with pt sister Jacquelynn Cree (home [phone] cell [phone])   Ruta Hinds, MD [facility] [place] Office: [phone] Pager: [phone]

## 2016-10-02 DIAGNOSIS — R1084 Generalized abdominal pain: Secondary | ICD-10-CM | POA: Diagnosis not present

## 2016-10-02 DIAGNOSIS — J449 Chronic obstructive pulmonary disease, unspecified: Secondary | ICD-10-CM | POA: Diagnosis not present

## 2016-10-02 DIAGNOSIS — Z6836 Body mass index (BMI) 36.0-36.9, adult: Secondary | ICD-10-CM | POA: Diagnosis not present

## 2016-10-02 DIAGNOSIS — I10 Essential (primary) hypertension: Secondary | ICD-10-CM | POA: Diagnosis not present

## 2016-10-02 DIAGNOSIS — E669 Obesity, unspecified: Secondary | ICD-10-CM | POA: Diagnosis not present

## 2016-10-02 DIAGNOSIS — K219 Gastro-esophageal reflux disease without esophagitis: Secondary | ICD-10-CM | POA: Diagnosis not present

## 2016-10-02 DIAGNOSIS — F17210 Nicotine dependence, cigarettes, uncomplicated: Secondary | ICD-10-CM | POA: Diagnosis not present

## 2016-10-02 DIAGNOSIS — Z8673 Personal history of transient ischemic attack (TIA), and cerebral infarction without residual deficits: Secondary | ICD-10-CM | POA: Diagnosis not present

## 2016-10-02 DIAGNOSIS — Z79899 Other long term (current) drug therapy: Secondary | ICD-10-CM | POA: Diagnosis not present

## 2016-10-02 DIAGNOSIS — Z9981 Dependence on supplemental oxygen: Secondary | ICD-10-CM | POA: Diagnosis not present

## 2016-10-02 LAB — COMPREHENSIVE METABOLIC PANEL
ALBUMIN: 2.6 g/dL — AB (ref 3.5–5.0)
ALK PHOS: 84 U/L (ref 38–126)
ALT: 11 U/L — AB (ref 17–63)
AST: 10 U/L — AB (ref 15–41)
Anion gap: 11 (ref 5–15)
BUN: 11 mg/dL (ref 6–20)
CALCIUM: 9.1 mg/dL (ref 8.9–10.3)
CO2: 26 mmol/L (ref 22–32)
Chloride: 99 mmol/L — ABNORMAL LOW (ref 101–111)
Creatinine, Ser: 0.94 mg/dL (ref 0.61–1.24)
GFR calc Af Amer: >60 mL/min (ref >60)
GFR calc non Af Amer: >60 mL/min (ref >60)
GLUCOSE: 121 mg/dL — AB (ref 65–99)
Potassium: 3.7 mmol/L (ref 3.5–5.1)
Sodium: 136 mmol/L (ref 135–145)
TOTAL PROTEIN: 5.8 g/dL — AB (ref 6.5–8.1)
Total Bilirubin: 0.5 mg/dL (ref 0.3–1.2)

## 2016-10-02 LAB — URINALYSIS, ROUTINE W REFLEX MICROSCOPIC
BILIRUBIN URINE: NEGATIVE
GLUCOSE, UA: NEGATIVE mg/dL
HGB URINE DIPSTICK: NEGATIVE
Ketones, ur: NEGATIVE mg/dL
Leukocytes, UA: NEGATIVE
Nitrite: NEGATIVE
Protein, ur: NEGATIVE mg/dL
SPECIFIC GRAVITY, URINE: 1.023 (ref 1.005–1.030)
pH: 5 (ref 5.0–8.0)

## 2016-10-02 LAB — CBC
HEMATOCRIT: 30.1 % — AB (ref 39.0–52.0)
HEMOGLOBIN: 9.5 g/dL — AB (ref 13.0–17.0)
MCH: 26.4 pg (ref 26.0–34.0)
MCHC: 31.6 g/dL (ref 30.0–36.0)
MCV: 83.6 fL (ref 78.0–100.0)
Platelets: 370 10*3/uL (ref 150–400)
RBC: 3.6 MIL/uL — ABNORMAL LOW (ref 4.22–5.81)
RDW: 16 % — ABNORMAL HIGH (ref 11.5–15.5)
WBC: 9.5 10*3/uL (ref 4.0–10.5)

## 2016-10-02 MED ORDER — VANCOMYCIN HCL IN DEXTROSE 1-5 GM/200ML-% IV SOLN
1000.0000 mg | Freq: Two times a day (BID) | INTRAVENOUS | Status: DC
  Filled 2016-10-02: qty 200

## 2016-10-02 MED ORDER — PIPERACILLIN-TAZOBACTAM 3.375 G IVPB
3.3750 g | Freq: Three times a day (TID) | INTRAVENOUS | Status: DC
  Administered 2016-10-02: 3.375 g via INTRAVENOUS
  Filled 2016-10-02 (×4): qty 50

## 2016-10-02 MED ORDER — VANCOMYCIN HCL 10 G IV SOLR
2000.0000 mg | Freq: Once | INTRAVENOUS | Status: AC
  Administered 2016-10-02: 2000 mg via INTRAVENOUS
  Filled 2016-10-02: qty 2000

## 2016-10-02 NOTE — Care Management Note (Signed)
Case Management Note  Patient Details  Name: Luke Allen MRN: [id number] Date of Birth: 02-Mar-1935  Subjective/Objective:   Presents with ABDpain/ possible aortic graft infection, history of AAA stent graft.               Ardine Eng (Friend)/ [place]      [phone]       Action/Plan: Plan is to d/c today and admitted to [facility] pending bed asignment. Receiving MD will be Dr.Sendhilnachan [facility]aling[facility]) and consulting vascular MD, Dr.Kim [phone]).   Inquires regarding transfer/bed assignment  please call AOD @ [phone] [phone] or [phone] (after 4pm).  Expected Discharge Date:  10/02/16               Expected Discharge Plan:  Acute to Acute Transfer  In-House Referral:     Discharge planning Services  CM Consult  Post Acute Care Choice:  Resumption of Svcs/PTA Provider Choice offered to:     DME Arranged:  Oxygen/ pt active with services DME Agency:  Other - Comment ([facility] supplies home oxygen)  HH Arranged:    HH Agency:     Status of Service:  Completed, signed off  If discussed at [place] of Stay Meetings, dates discussed:    Additional Comments:  Sharin Mons, RN 10/02/2016, 3:58 PM

## 2016-10-02 NOTE — Progress Notes (Signed)
Patient still waiting a bed available to [facility]. No new message from the [facility]. Will continue to monitor.

## 2016-10-02 NOTE — Care Management Obs Status (Signed)
[place] NOTIFICATION   Patient Details  Name: Oscar Hank MRN: [id number] Date of Birth: 1935/04/18   Medicare Observation Status Notification Given:  Yes    Sharin Mons, RN 10/02/2016, 12:33 PM

## 2016-10-02 NOTE — Progress Notes (Addendum)
Spoke with Dr Maudie Mercury (pager [phone])  at [facility] with vascular surgery team.  Pt has been accepted for transfer when bed available.  Will start Vanc/Zosyn today at their request as well as draw blood cultures.    Pt to transfer when bed available at [facility], MD [facility] [place] Office: [phone] Pager: [phone]

## 2016-10-02 NOTE — Discharge Summary (Signed)
Discharge Summary    Luke Allen 1935-06-10 82 y.o. male  [id number]  Admission Date: 10/01/2016  Discharge Date: 10/02/16  Physician: Elam Dutch, MD  Admission Diagnosis: Generalized abdominal pain [R10.84] Exertional dyspnea [R06.09]   HPI:   This is a 82 y.o. male  transferred from [place] for evaluation of abdominal pain with prior history of AAA stent graft.  Difficult to obtain history from patient as he is confused.  I spoke with pt sister by phone to obtain some history.  He lives with a caretaker "Ivin Booty" and I left a message for her to call the ER.  I have also requested med records from the [facility]  Pt states he has had abdominal pain for about 3 weeks but worse today.  He has had some nausea but no vomiting.  Pain usually occurs in the morning but lasted longer today.  He describes it as diffuse.  He denies diarrhea but has had some constipation recently.  Last BM was yesterday.  He does not really know his medical history.  It is mostly from records at [facility] and some [place] notes. Other medical problems include narcolepsy, "mental health issues", CAD, COPD, narcolepsy, sleep apnea, seizures all of which have been stable.  According to pt sister he had AAA repair at [facility] in 2015 and was in the hospital for about a month afterward with psychiatric issues.   Hospital Course:  The patient was admitted to the hospital with abdominal pain and confusion.  The CT from [place] was reviewed.  Aneurysm stent graft no evidence of rupture.  Periaortic thickening in the juxtarenal aorta, no fluid or air, mention made in report of prior comparison but this is not available for me to review.  He has a possible aortic graft infection but currently stable not in need of emergent procedure.  He has been receiving Tylenol for pain given he has had some confusion and also constipation.  On hospital day one, Dr. One[place] Alar has spoken to the people at the [place] to get the pt transferred  back to their facility for further treatment.   CBC    Component Value Date/Time   WBC 9.5 10/02/2016 0403   RBC 3.60 (L) 10/02/2016 0403   HGB 9.5 (L) 10/02/2016 0403   HCT 30.1 (L) 10/02/2016 0403   PLT 370 10/02/2016 0403   MCV 83.6 10/02/2016 0403   MCH 26.4 10/02/2016 0403   MCHC 31.6 10/02/2016 0403   RDW 16.0 (H) 10/02/2016 0403   LYMPHSABS 1.5 08/27/2013 1723   MONOABS 0.4 08/27/2013 1723   EOSABS 0.2 08/27/2013 1723   BASOSABS 0.0 08/27/2013 1723    BMET    Component Value Date/Time   NA 136 10/02/2016 0403   K 3.7 10/02/2016 0403   CL 99 (L) 10/02/2016 0403   CO2 26 10/02/2016 0403   GLUCOSE 121 (H) 10/02/2016 0403   BUN 11 10/02/2016 0403   CREATININE 0.94 10/02/2016 0403   CALCIUM 9.1 10/02/2016 0403   GFRNONAA >60 10/02/2016 0403   GFRAA >60 10/02/2016 0403      Discharge Instructions    Call MD for:  redness, tenderness, or signs of infection (pain, swelling, bleeding, redness, odor or green/yellow discharge around incision site)    Complete by:  As directed    Call MD for:  severe or increased pain, loss or decreased feeling  in affected limb(s)    Complete by:  As directed    Call MD for:  temperature >100.5  Complete by:  As directed    Resume previous diet    Complete by:  As directed       Discharge Diagnosis:  Generalized abdominal pain [R10.84] Exertional dyspnea [R06.09]  Secondary Diagnosis: Patient Active Problem List   Diagnosis Date Noted  . Abdominal pain 10/01/2016  . Tobacco abuse 09/05/2014  . CVA (cerebral infarction) 08/29/2013  . 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 Acid reflux   . Anxiety   . Sleep apnea   . Hypertensive emergency 08/27/2013   Past Medical History:  Diagnosis Date  . Acid reflux   . Anxiety   . Arthritis   . Catalepsy   . Coronary artery disease   . Hypertension   . Narcolepsy   . Seizures ([place])   . Sleep apnea   . Sleep apnea   . Stroke ([place]) 08/27/2013   [facility] Distance  notes 09/01/2013     Allergies as of 10/02/2016   No Known Allergies     Medication List    TAKE these medications   albuterol 108 (90 Base) MCG/ACT inhaler Commonly known as:  PROVENTIL HFA;VENTOLIN HFA Inhale 2 puffs into the lungs every 6 (six) hours as needed for wheezing or shortness of breath.   allopurinol 300 MG tablet Commonly known as:  ZYLOPRIM Take 1 tablet by mouth daily.   aspirin 325 MG tablet Take 1 tablet (325 mg total) by mouth daily.   bisacodyl 5 MG EC tablet Commonly known as:  DULCOLAX Take 5 mg by mouth daily as needed for moderate constipation.   cetirizine 10 MG tablet Commonly known as:  ZYRTEC Take 10 mg by mouth daily as needed for allergies.   furosemide 40 MG tablet Commonly known as:  LASIX Take 1 tablet by mouth daily.   gabapentin 100 MG capsule Commonly known as:  NEURONTIN Take 1 capsule by mouth daily.   lisinopril 40 MG tablet Commonly known as:  PRINIVIL,ZESTRIL Take 20 mg by mouth daily.   methylphenidate 10 MG tablet Commonly known as:  RITALIN Take 10 mg by mouth 3 (three) times daily with meals. Unknown prn, pt stated he takes when he needs it for seziures   nitroGLYCERIN 0.4 MG SL tablet Commonly known as:  NITROSTAT Place 0.4 mg under the tongue every 5 (five) minutes as needed for chest pain.   omeprazole 20 MG capsule Commonly known as:  PRILOSEC Take 20 mg by mouth daily.   oxyCODONE 5 MG immediate release tablet Commonly known as:  Oxy IR/ROXICODONE Take 1 tablet by mouth 2 (two) times daily as needed.   Potassium Chloride ER 20 MEQ Tbcr Take 1 tablet by mouth daily.   tamsulosin 0.4 MG Caps capsule Commonly known as:  FLOMAX Take 0.4 mg by mouth at bedtime.   triamcinolone ointment 0.5 % Commonly known as:  KENALOG Apply 1 application topically 2 (two) times daily. Apply to affected area   venlafaxine XR 150 MG 24 hr capsule Commonly known as:  EFFEXOR-XR Take 150 mg by mouth 2 (two) times daily.     venlafaxine XR 37.5 MG 24 hr capsule Commonly known as:  EFFEXOR-XR Take 37.5 mg by mouth every evening.   vitamin C 500 MG tablet Commonly known as:  ASCORBIC ACID Take 500 mg by mouth daily.       Prescriptions given: none  Disposition: [facility]  Patient's condition: is Fair    Leontine Locket, [place] [facility] [phone] 10/02/2016  11:29 AM

## 2016-10-02 NOTE — Progress Notes (Signed)
Patient discharged to [facility].  Traveled by stretcher with [facility].  Report given to the transport team, and any questions answered at bedside.

## 2016-10-02 NOTE — Progress Notes (Signed)
Pharmacy Antibiotic Note  Luke Allen is a 82 y.o. male admitted on 10/01/2016 with possible aortic graft infection. Pharmacy has been consulted for Vancomycin dosing.  WBC within normal limits. Tmax 99.1.  Plan to start Vancomycin and Zosyn after blood cultures are obtained.   Plan: Vancomycin 2000 mg IV x1, then 1000 mg IV every 12 hours per vancomycin obesity protocol - slightly more aggressive due to site of infection.  Continue Zosyn 3.375g IV every 8 hours - each dose over 4 hours.  Monitor renal function, culture results and clinical status.   Height: 6' (182.9 cm) Weight: 236 lb 8.9 oz (107.3 kg) IBW/kg (Calculated) : 77.6  Temp (24hrs), Avg:99 F (37.2 C), Min:98.2 F (36.8 C), Max:99.4 F (37.4 C)   Recent Labs Lab 10/01/16 2143 10/02/16 0403  WBC 9.0 9.5  CREATININE 1.09 0.94    Estimated Creatinine Clearance: 80.7 mL/min (by C-G formula based on SCr of 0.94 mg/dL).    Allergies  Allergen Reactions  . Morphine And Related   . Other     Theraflu Cold and Cough ME  . Tiotropium     Antimicrobials this admission: Vancomycin 3/22 >> Zosyn 3/22 >>  Dose adjustments this admission:  Microbiology results: 3/22 Blood x1 >>  Thank you for allowing pharmacy to be a part of this patient's care.  Sloan Leiter, PharmD, BCPS Clinical Pharmacist Clinical Phone 10/02/2016 until 3:30 PM - [phone] After hours, please call #[phone] 10/02/2016 3:39 PM

## 2016-10-07 LAB — CULTURE, BLOOD (SINGLE): Culture: NO GROWTH

## 2016-10-20 ENCOUNTER — Ambulatory Visit (INDEPENDENT_AMBULATORY_CARE_PROVIDER_SITE_OTHER): Payer: Medicare Other | Admitting: *Deleted

## 2016-10-20 DIAGNOSIS — I639 Cerebral infarction, unspecified: Secondary | ICD-10-CM | POA: Diagnosis not present

## 2016-10-20 NOTE — Progress Notes (Signed)
Carelink Summary Report / Loop Recorder 

## 2016-10-26 LAB — CUP PACEART REMOTE DEVICE CHECK
Implantable Pulse Generator Implant Date: 20150218
MDC IDC SESS DTM: 20180409113903

## 2016-10-26 NOTE — Progress Notes (Signed)
Carelink summary report received. Battery status OK. Normal device function. No new symptom episodes, tachy episodes, brady, or pause episodes. No new AF episodes. Monthly summary reports and ROV/PRN 

## 2016-10-29 DIAGNOSIS — F419 Anxiety disorder, unspecified: Secondary | ICD-10-CM

## 2016-10-29 DIAGNOSIS — R112 Nausea with vomiting, unspecified: Secondary | ICD-10-CM

## 2016-10-29 DIAGNOSIS — I252 Old myocardial infarction: Secondary | ICD-10-CM | POA: Diagnosis not present

## 2016-10-29 DIAGNOSIS — Z720 Tobacco use: Secondary | ICD-10-CM | POA: Diagnosis not present

## 2016-10-29 DIAGNOSIS — J449 Chronic obstructive pulmonary disease, unspecified: Secondary | ICD-10-CM | POA: Diagnosis not present

## 2016-10-29 DIAGNOSIS — G47411 Narcolepsy with cataplexy: Secondary | ICD-10-CM | POA: Diagnosis not present

## 2016-10-29 DIAGNOSIS — N179 Acute kidney failure, unspecified: Secondary | ICD-10-CM

## 2016-10-29 DIAGNOSIS — R531 Weakness: Secondary | ICD-10-CM | POA: Diagnosis not present

## 2016-10-29 DIAGNOSIS — I129 Hypertensive chronic kidney disease with stage 1 through stage 4 chronic kidney disease, or unspecified chronic kidney disease: Secondary | ICD-10-CM | POA: Diagnosis not present

## 2016-10-29 DIAGNOSIS — Z8673 Personal history of transient ischemic attack (TIA), and cerebral infarction without residual deficits: Secondary | ICD-10-CM | POA: Diagnosis not present

## 2016-10-29 DIAGNOSIS — R296 Repeated falls: Secondary | ICD-10-CM | POA: Diagnosis not present

## 2016-10-29 DIAGNOSIS — G4733 Obstructive sleep apnea (adult) (pediatric): Secondary | ICD-10-CM | POA: Diagnosis not present

## 2016-10-29 DIAGNOSIS — S8002XA Contusion of left knee, initial encounter: Secondary | ICD-10-CM | POA: Diagnosis not present

## 2016-10-29 DIAGNOSIS — I2510 Atherosclerotic heart disease of native coronary artery without angina pectoris: Secondary | ICD-10-CM | POA: Diagnosis not present

## 2016-10-29 DIAGNOSIS — K859 Acute pancreatitis without necrosis or infection, unspecified: Secondary | ICD-10-CM

## 2016-10-29 DIAGNOSIS — N400 Enlarged prostate without lower urinary tract symptoms: Secondary | ICD-10-CM

## 2016-10-29 DIAGNOSIS — Z743 Need for continuous supervision: Secondary | ICD-10-CM | POA: Diagnosis not present

## 2016-10-29 DIAGNOSIS — S3993XA Unspecified injury of pelvis, initial encounter: Secondary | ICD-10-CM | POA: Diagnosis not present

## 2016-10-29 DIAGNOSIS — Z955 Presence of coronary angioplasty implant and graft: Secondary | ICD-10-CM | POA: Diagnosis not present

## 2016-10-29 DIAGNOSIS — M1990 Unspecified osteoarthritis, unspecified site: Secondary | ICD-10-CM | POA: Diagnosis not present

## 2016-10-29 DIAGNOSIS — N289 Disorder of kidney and ureter, unspecified: Secondary | ICD-10-CM | POA: Diagnosis not present

## 2016-10-29 DIAGNOSIS — S299XXA Unspecified injury of thorax, initial encounter: Secondary | ICD-10-CM | POA: Diagnosis not present

## 2016-10-29 DIAGNOSIS — N189 Chronic kidney disease, unspecified: Secondary | ICD-10-CM | POA: Diagnosis not present

## 2016-10-29 DIAGNOSIS — R001 Bradycardia, unspecified: Secondary | ICD-10-CM | POA: Diagnosis not present

## 2016-10-29 DIAGNOSIS — Z79899 Other long term (current) drug therapy: Secondary | ICD-10-CM | POA: Diagnosis not present

## 2016-10-29 DIAGNOSIS — S3991XA Unspecified injury of abdomen, initial encounter: Secondary | ICD-10-CM | POA: Diagnosis not present

## 2016-10-29 DIAGNOSIS — I10 Essential (primary) hypertension: Secondary | ICD-10-CM | POA: Diagnosis not present

## 2016-10-29 DIAGNOSIS — J441 Chronic obstructive pulmonary disease with (acute) exacerbation: Secondary | ICD-10-CM | POA: Diagnosis not present

## 2016-10-29 DIAGNOSIS — R748 Abnormal levels of other serum enzymes: Secondary | ICD-10-CM

## 2016-10-29 DIAGNOSIS — E860 Dehydration: Secondary | ICD-10-CM

## 2016-10-29 DIAGNOSIS — S32030A Wedge compression fracture of third lumbar vertebra, initial encounter for closed fracture: Secondary | ICD-10-CM | POA: Diagnosis not present

## 2016-10-29 DIAGNOSIS — R197 Diarrhea, unspecified: Secondary | ICD-10-CM | POA: Diagnosis not present

## 2016-10-29 DIAGNOSIS — M25561 Pain in right knee: Secondary | ICD-10-CM | POA: Diagnosis not present

## 2016-10-29 DIAGNOSIS — Z7982 Long term (current) use of aspirin: Secondary | ICD-10-CM | POA: Diagnosis not present

## 2016-10-29 DIAGNOSIS — Z87891 Personal history of nicotine dependence: Secondary | ICD-10-CM | POA: Diagnosis not present

## 2016-10-29 DIAGNOSIS — R404 Transient alteration of awareness: Secondary | ICD-10-CM | POA: Diagnosis not present

## 2016-10-29 DIAGNOSIS — R279 Unspecified lack of coordination: Secondary | ICD-10-CM | POA: Diagnosis not present

## 2016-10-29 DIAGNOSIS — F17200 Nicotine dependence, unspecified, uncomplicated: Secondary | ICD-10-CM | POA: Diagnosis not present

## 2016-10-30 DIAGNOSIS — F17200 Nicotine dependence, unspecified, uncomplicated: Secondary | ICD-10-CM

## 2016-10-30 DIAGNOSIS — I10 Essential (primary) hypertension: Secondary | ICD-10-CM

## 2016-10-30 DIAGNOSIS — R296 Repeated falls: Secondary | ICD-10-CM

## 2016-10-30 DIAGNOSIS — Z720 Tobacco use: Secondary | ICD-10-CM

## 2016-10-30 DIAGNOSIS — J449 Chronic obstructive pulmonary disease, unspecified: Secondary | ICD-10-CM

## 2016-10-30 DIAGNOSIS — S32030A Wedge compression fracture of third lumbar vertebra, initial encounter for closed fracture: Secondary | ICD-10-CM

## 2016-10-30 DIAGNOSIS — R001 Bradycardia, unspecified: Secondary | ICD-10-CM

## 2016-11-01 DIAGNOSIS — N179 Acute kidney failure, unspecified: Secondary | ICD-10-CM | POA: Diagnosis not present

## 2016-11-01 DIAGNOSIS — Z743 Need for continuous supervision: Secondary | ICD-10-CM | POA: Diagnosis not present

## 2016-11-01 DIAGNOSIS — E860 Dehydration: Secondary | ICD-10-CM | POA: Diagnosis not present

## 2016-11-01 DIAGNOSIS — K859 Acute pancreatitis without necrosis or infection, unspecified: Secondary | ICD-10-CM | POA: Diagnosis not present

## 2016-11-01 DIAGNOSIS — J9611 Chronic respiratory failure with hypoxia: Secondary | ICD-10-CM | POA: Diagnosis not present

## 2016-11-01 DIAGNOSIS — R112 Nausea with vomiting, unspecified: Secondary | ICD-10-CM | POA: Diagnosis not present

## 2016-11-01 DIAGNOSIS — I119 Hypertensive heart disease without heart failure: Secondary | ICD-10-CM | POA: Diagnosis not present

## 2016-11-01 DIAGNOSIS — R262 Difficulty in walking, not elsewhere classified: Secondary | ICD-10-CM | POA: Diagnosis not present

## 2016-11-01 DIAGNOSIS — R748 Abnormal levels of other serum enzymes: Secondary | ICD-10-CM | POA: Diagnosis not present

## 2016-11-01 DIAGNOSIS — J441 Chronic obstructive pulmonary disease with (acute) exacerbation: Secondary | ICD-10-CM | POA: Diagnosis not present

## 2016-11-01 DIAGNOSIS — N189 Chronic kidney disease, unspecified: Secondary | ICD-10-CM | POA: Diagnosis not present

## 2016-11-01 DIAGNOSIS — R279 Unspecified lack of coordination: Secondary | ICD-10-CM | POA: Diagnosis not present

## 2016-11-01 DIAGNOSIS — D649 Anemia, unspecified: Secondary | ICD-10-CM | POA: Diagnosis not present

## 2016-11-03 DIAGNOSIS — I119 Hypertensive heart disease without heart failure: Secondary | ICD-10-CM | POA: Diagnosis not present

## 2016-11-03 DIAGNOSIS — R262 Difficulty in walking, not elsewhere classified: Secondary | ICD-10-CM | POA: Diagnosis not present

## 2016-11-03 DIAGNOSIS — D649 Anemia, unspecified: Secondary | ICD-10-CM | POA: Diagnosis not present

## 2016-11-03 DIAGNOSIS — J9611 Chronic respiratory failure with hypoxia: Secondary | ICD-10-CM | POA: Diagnosis not present

## 2016-11-12 ENCOUNTER — Telehealth: Payer: Self-pay | Admitting: Cardiology

## 2016-11-12 NOTE — Telephone Encounter (Signed)
Attempted to call pt b/c his home monitor has not updated in the last 14 days. No answer and unable to leave a message.   

## 2016-11-19 ENCOUNTER — Ambulatory Visit (INDEPENDENT_AMBULATORY_CARE_PROVIDER_SITE_OTHER): Payer: Medicare Other | Admitting: *Deleted

## 2016-11-19 DIAGNOSIS — K219 Gastro-esophageal reflux disease without esophagitis: Secondary | ICD-10-CM | POA: Diagnosis not present

## 2016-11-19 DIAGNOSIS — Z7982 Long term (current) use of aspirin: Secondary | ICD-10-CM | POA: Diagnosis not present

## 2016-11-19 DIAGNOSIS — H5461 Unqualified visual loss, right eye, normal vision left eye: Secondary | ICD-10-CM | POA: Diagnosis not present

## 2016-11-19 DIAGNOSIS — R278 Other lack of coordination: Secondary | ICD-10-CM | POA: Diagnosis not present

## 2016-11-19 DIAGNOSIS — N189 Chronic kidney disease, unspecified: Secondary | ICD-10-CM | POA: Diagnosis not present

## 2016-11-19 DIAGNOSIS — M1990 Unspecified osteoarthritis, unspecified site: Secondary | ICD-10-CM | POA: Diagnosis not present

## 2016-11-19 DIAGNOSIS — R2689 Other abnormalities of gait and mobility: Secondary | ICD-10-CM | POA: Diagnosis not present

## 2016-11-19 DIAGNOSIS — G8929 Other chronic pain: Secondary | ICD-10-CM | POA: Diagnosis not present

## 2016-11-19 DIAGNOSIS — I129 Hypertensive chronic kidney disease with stage 1 through stage 4 chronic kidney disease, or unspecified chronic kidney disease: Secondary | ICD-10-CM | POA: Diagnosis not present

## 2016-11-19 DIAGNOSIS — I2510 Atherosclerotic heart disease of native coronary artery without angina pectoris: Secondary | ICD-10-CM | POA: Diagnosis not present

## 2016-11-19 DIAGNOSIS — I639 Cerebral infarction, unspecified: Secondary | ICD-10-CM

## 2016-11-19 DIAGNOSIS — M6281 Muscle weakness (generalized): Secondary | ICD-10-CM | POA: Diagnosis not present

## 2016-11-19 NOTE — Progress Notes (Signed)
Carelink Summary Report / Loop Recorder 

## 2016-11-20 ENCOUNTER — Encounter: Payer: Self-pay | Admitting: Internal Medicine

## 2016-11-21 DIAGNOSIS — G8929 Other chronic pain: Secondary | ICD-10-CM | POA: Diagnosis not present

## 2016-11-21 DIAGNOSIS — M6281 Muscle weakness (generalized): Secondary | ICD-10-CM | POA: Diagnosis not present

## 2016-11-21 DIAGNOSIS — K219 Gastro-esophageal reflux disease without esophagitis: Secondary | ICD-10-CM | POA: Diagnosis not present

## 2016-11-21 DIAGNOSIS — I2510 Atherosclerotic heart disease of native coronary artery without angina pectoris: Secondary | ICD-10-CM | POA: Diagnosis not present

## 2016-11-21 DIAGNOSIS — I129 Hypertensive chronic kidney disease with stage 1 through stage 4 chronic kidney disease, or unspecified chronic kidney disease: Secondary | ICD-10-CM | POA: Diagnosis not present

## 2016-11-21 DIAGNOSIS — R278 Other lack of coordination: Secondary | ICD-10-CM | POA: Diagnosis not present

## 2016-11-21 DIAGNOSIS — R2689 Other abnormalities of gait and mobility: Secondary | ICD-10-CM | POA: Diagnosis not present

## 2016-11-21 DIAGNOSIS — N189 Chronic kidney disease, unspecified: Secondary | ICD-10-CM | POA: Diagnosis not present

## 2016-11-21 DIAGNOSIS — H5461 Unqualified visual loss, right eye, normal vision left eye: Secondary | ICD-10-CM | POA: Diagnosis not present

## 2016-11-21 DIAGNOSIS — M1990 Unspecified osteoarthritis, unspecified site: Secondary | ICD-10-CM | POA: Diagnosis not present

## 2016-11-21 DIAGNOSIS — Z7982 Long term (current) use of aspirin: Secondary | ICD-10-CM | POA: Diagnosis not present

## 2016-11-25 DIAGNOSIS — Z7982 Long term (current) use of aspirin: Secondary | ICD-10-CM | POA: Diagnosis not present

## 2016-11-25 DIAGNOSIS — R2689 Other abnormalities of gait and mobility: Secondary | ICD-10-CM | POA: Diagnosis not present

## 2016-11-25 DIAGNOSIS — M6281 Muscle weakness (generalized): Secondary | ICD-10-CM | POA: Diagnosis not present

## 2016-11-25 DIAGNOSIS — H5461 Unqualified visual loss, right eye, normal vision left eye: Secondary | ICD-10-CM | POA: Diagnosis not present

## 2016-11-25 DIAGNOSIS — N189 Chronic kidney disease, unspecified: Secondary | ICD-10-CM | POA: Diagnosis not present

## 2016-11-25 DIAGNOSIS — K219 Gastro-esophageal reflux disease without esophagitis: Secondary | ICD-10-CM | POA: Diagnosis not present

## 2016-11-25 DIAGNOSIS — G8929 Other chronic pain: Secondary | ICD-10-CM | POA: Diagnosis not present

## 2016-11-25 DIAGNOSIS — M1990 Unspecified osteoarthritis, unspecified site: Secondary | ICD-10-CM | POA: Diagnosis not present

## 2016-11-25 DIAGNOSIS — R278 Other lack of coordination: Secondary | ICD-10-CM | POA: Diagnosis not present

## 2016-11-25 DIAGNOSIS — I129 Hypertensive chronic kidney disease with stage 1 through stage 4 chronic kidney disease, or unspecified chronic kidney disease: Secondary | ICD-10-CM | POA: Diagnosis not present

## 2016-11-25 DIAGNOSIS — I2510 Atherosclerotic heart disease of native coronary artery without angina pectoris: Secondary | ICD-10-CM | POA: Diagnosis not present

## 2016-11-27 DIAGNOSIS — G8929 Other chronic pain: Secondary | ICD-10-CM | POA: Diagnosis not present

## 2016-11-27 DIAGNOSIS — I2510 Atherosclerotic heart disease of native coronary artery without angina pectoris: Secondary | ICD-10-CM | POA: Diagnosis not present

## 2016-11-27 DIAGNOSIS — R278 Other lack of coordination: Secondary | ICD-10-CM | POA: Diagnosis not present

## 2016-11-27 DIAGNOSIS — K219 Gastro-esophageal reflux disease without esophagitis: Secondary | ICD-10-CM | POA: Diagnosis not present

## 2016-11-27 DIAGNOSIS — R2689 Other abnormalities of gait and mobility: Secondary | ICD-10-CM | POA: Diagnosis not present

## 2016-11-27 DIAGNOSIS — M1990 Unspecified osteoarthritis, unspecified site: Secondary | ICD-10-CM | POA: Diagnosis not present

## 2016-11-27 DIAGNOSIS — M6281 Muscle weakness (generalized): Secondary | ICD-10-CM | POA: Diagnosis not present

## 2016-11-27 DIAGNOSIS — N189 Chronic kidney disease, unspecified: Secondary | ICD-10-CM | POA: Diagnosis not present

## 2016-11-27 DIAGNOSIS — H5461 Unqualified visual loss, right eye, normal vision left eye: Secondary | ICD-10-CM | POA: Diagnosis not present

## 2016-11-27 DIAGNOSIS — I129 Hypertensive chronic kidney disease with stage 1 through stage 4 chronic kidney disease, or unspecified chronic kidney disease: Secondary | ICD-10-CM | POA: Diagnosis not present

## 2016-11-27 DIAGNOSIS — Z7982 Long term (current) use of aspirin: Secondary | ICD-10-CM | POA: Diagnosis not present

## 2016-11-28 LAB — CUP PACEART REMOTE DEVICE CHECK
Implantable Pulse Generator Implant Date: 20150218
MDC IDC SESS DTM: 20180509145022

## 2016-12-02 ENCOUNTER — Telehealth: Payer: Self-pay | Admitting: *Deleted

## 2016-12-02 DIAGNOSIS — M6281 Muscle weakness (generalized): Secondary | ICD-10-CM | POA: Diagnosis not present

## 2016-12-02 DIAGNOSIS — M1990 Unspecified osteoarthritis, unspecified site: Secondary | ICD-10-CM | POA: Diagnosis not present

## 2016-12-02 DIAGNOSIS — H5461 Unqualified visual loss, right eye, normal vision left eye: Secondary | ICD-10-CM | POA: Diagnosis not present

## 2016-12-02 DIAGNOSIS — I2510 Atherosclerotic heart disease of native coronary artery without angina pectoris: Secondary | ICD-10-CM | POA: Diagnosis not present

## 2016-12-02 DIAGNOSIS — Z7982 Long term (current) use of aspirin: Secondary | ICD-10-CM | POA: Diagnosis not present

## 2016-12-02 DIAGNOSIS — R278 Other lack of coordination: Secondary | ICD-10-CM | POA: Diagnosis not present

## 2016-12-02 DIAGNOSIS — I129 Hypertensive chronic kidney disease with stage 1 through stage 4 chronic kidney disease, or unspecified chronic kidney disease: Secondary | ICD-10-CM | POA: Diagnosis not present

## 2016-12-02 DIAGNOSIS — N189 Chronic kidney disease, unspecified: Secondary | ICD-10-CM | POA: Diagnosis not present

## 2016-12-02 DIAGNOSIS — K219 Gastro-esophageal reflux disease without esophagitis: Secondary | ICD-10-CM | POA: Diagnosis not present

## 2016-12-02 DIAGNOSIS — G8929 Other chronic pain: Secondary | ICD-10-CM | POA: Diagnosis not present

## 2016-12-02 DIAGNOSIS — R2689 Other abnormalities of gait and mobility: Secondary | ICD-10-CM | POA: Diagnosis not present

## 2016-12-02 NOTE — Telephone Encounter (Signed)
Spoke with Patient and Altamese Cabal (Care Helper) regarding LINQ at [facility] since October 29, 2016. Advised patient of next steps, scheduling an appointment with Dr. Lovena Le to discuss explant or leaving the LINQ implanted and following-up with Dr. Lovena Le in December. Patient declined explant at this time. Advised patient we would be sending a return kit in the mail for his Carelink monitor. Patient verbalized understanding. Gave device clinic phone number if they have further questions or concerns.

## 2016-12-04 DIAGNOSIS — M6281 Muscle weakness (generalized): Secondary | ICD-10-CM | POA: Diagnosis not present

## 2016-12-04 DIAGNOSIS — I2510 Atherosclerotic heart disease of native coronary artery without angina pectoris: Secondary | ICD-10-CM | POA: Diagnosis not present

## 2016-12-04 DIAGNOSIS — G8929 Other chronic pain: Secondary | ICD-10-CM | POA: Diagnosis not present

## 2016-12-04 DIAGNOSIS — N189 Chronic kidney disease, unspecified: Secondary | ICD-10-CM | POA: Diagnosis not present

## 2016-12-04 DIAGNOSIS — I129 Hypertensive chronic kidney disease with stage 1 through stage 4 chronic kidney disease, or unspecified chronic kidney disease: Secondary | ICD-10-CM | POA: Diagnosis not present

## 2016-12-04 DIAGNOSIS — R278 Other lack of coordination: Secondary | ICD-10-CM | POA: Diagnosis not present

## 2016-12-04 DIAGNOSIS — M1990 Unspecified osteoarthritis, unspecified site: Secondary | ICD-10-CM | POA: Diagnosis not present

## 2016-12-04 DIAGNOSIS — R2689 Other abnormalities of gait and mobility: Secondary | ICD-10-CM | POA: Diagnosis not present

## 2016-12-04 DIAGNOSIS — Z7982 Long term (current) use of aspirin: Secondary | ICD-10-CM | POA: Diagnosis not present

## 2016-12-04 DIAGNOSIS — K219 Gastro-esophageal reflux disease without esophagitis: Secondary | ICD-10-CM | POA: Diagnosis not present

## 2016-12-04 DIAGNOSIS — H5461 Unqualified visual loss, right eye, normal vision left eye: Secondary | ICD-10-CM | POA: Diagnosis not present

## 2016-12-09 DIAGNOSIS — M1990 Unspecified osteoarthritis, unspecified site: Secondary | ICD-10-CM | POA: Diagnosis not present

## 2016-12-09 DIAGNOSIS — H5461 Unqualified visual loss, right eye, normal vision left eye: Secondary | ICD-10-CM | POA: Diagnosis not present

## 2016-12-09 DIAGNOSIS — M6281 Muscle weakness (generalized): Secondary | ICD-10-CM | POA: Diagnosis not present

## 2016-12-09 DIAGNOSIS — K219 Gastro-esophageal reflux disease without esophagitis: Secondary | ICD-10-CM | POA: Diagnosis not present

## 2016-12-09 DIAGNOSIS — I2510 Atherosclerotic heart disease of native coronary artery without angina pectoris: Secondary | ICD-10-CM | POA: Diagnosis not present

## 2016-12-09 DIAGNOSIS — R278 Other lack of coordination: Secondary | ICD-10-CM | POA: Diagnosis not present

## 2016-12-09 DIAGNOSIS — N189 Chronic kidney disease, unspecified: Secondary | ICD-10-CM | POA: Diagnosis not present

## 2016-12-09 DIAGNOSIS — I129 Hypertensive chronic kidney disease with stage 1 through stage 4 chronic kidney disease, or unspecified chronic kidney disease: Secondary | ICD-10-CM | POA: Diagnosis not present

## 2016-12-09 DIAGNOSIS — G8929 Other chronic pain: Secondary | ICD-10-CM | POA: Diagnosis not present

## 2016-12-09 DIAGNOSIS — Z7982 Long term (current) use of aspirin: Secondary | ICD-10-CM | POA: Diagnosis not present

## 2016-12-09 DIAGNOSIS — R2689 Other abnormalities of gait and mobility: Secondary | ICD-10-CM | POA: Diagnosis not present

## 2016-12-11 DIAGNOSIS — M1990 Unspecified osteoarthritis, unspecified site: Secondary | ICD-10-CM | POA: Diagnosis not present

## 2016-12-11 DIAGNOSIS — I129 Hypertensive chronic kidney disease with stage 1 through stage 4 chronic kidney disease, or unspecified chronic kidney disease: Secondary | ICD-10-CM | POA: Diagnosis not present

## 2016-12-11 DIAGNOSIS — N189 Chronic kidney disease, unspecified: Secondary | ICD-10-CM | POA: Diagnosis not present

## 2016-12-11 DIAGNOSIS — G8929 Other chronic pain: Secondary | ICD-10-CM | POA: Diagnosis not present

## 2016-12-11 DIAGNOSIS — K219 Gastro-esophageal reflux disease without esophagitis: Secondary | ICD-10-CM | POA: Diagnosis not present

## 2016-12-11 DIAGNOSIS — R2689 Other abnormalities of gait and mobility: Secondary | ICD-10-CM | POA: Diagnosis not present

## 2016-12-11 DIAGNOSIS — Z7982 Long term (current) use of aspirin: Secondary | ICD-10-CM | POA: Diagnosis not present

## 2016-12-11 DIAGNOSIS — H5461 Unqualified visual loss, right eye, normal vision left eye: Secondary | ICD-10-CM | POA: Diagnosis not present

## 2016-12-11 DIAGNOSIS — R278 Other lack of coordination: Secondary | ICD-10-CM | POA: Diagnosis not present

## 2016-12-11 DIAGNOSIS — M6281 Muscle weakness (generalized): Secondary | ICD-10-CM | POA: Diagnosis not present

## 2016-12-11 DIAGNOSIS — I2510 Atherosclerotic heart disease of native coronary artery without angina pectoris: Secondary | ICD-10-CM | POA: Diagnosis not present

## 2016-12-19 ENCOUNTER — Encounter: Payer: Medicare Other | Admitting: *Deleted

## 2017-05-08 ENCOUNTER — Other Ambulatory Visit: Payer: Self-pay

## 2017-05-13 ENCOUNTER — Other Ambulatory Visit: Payer: Self-pay

## 2017-05-13 ENCOUNTER — Telehealth: Payer: Self-pay | Admitting: Internal Medicine

## 2017-05-13 NOTE — Telephone Encounter (Signed)
Per Ardine Eng, she no longer wants to follow up with Dr. Lovena Le, [place] being followed by [facility]   Per Dr. Lovena Le, [place] [facility]  No follow up needed at this time.  Notified to call office for future needs.

## 2017-05-13 NOTE — Telephone Encounter (Signed)
Per care take she needs a call back she stated pt does not have a Pacer any more she was not sure of the name.   She does not know if the appt is needed.

## 2017-05-13 NOTE — Patient Outreach (Signed)
[facility]) Care Management  05/13/2017  Festus Pursel [birth year] [id number]   TELEPHONE SCREENING Referral date: 05/08/17 Referral source: EMMI engagement Referral reason: EMMI engagement score: 12 Insurance: [employer] Attempt #1  Telephone call to patient regarding EMMI engagement referral. Unable to reach patient or leave voice message. Phone only rang.   PLAN: RNCM will attempt 2nd telephone call to patient within 1 week.   Quinn Plowman RN,BSN,CCM [facility] Telephonic  [phone]

## 2017-05-15 ENCOUNTER — Other Ambulatory Visit: Payer: Self-pay

## 2017-05-15 NOTE — Patient Outreach (Signed)
[facility]) Care Management  05/15/2017  Graden Hoshino [birth year] [id number]  TELEPHONE SCREENING Referral date: 05/08/17 Referral source: EMMI engagement Referral reason: EMMI engagement score: 12 Insurance: [employer] Attempt #2  Telephone call to patient regarding EMMI engagement referral. Unable to reach patient or leave voice message. Phone only rang.   PLAN: RNCM will attempt 3rd telephone call to patient within 1 week.   Quinn Plowman RN,BSN,CCM [facility] Telephonic  [phone]

## 2017-05-18 ENCOUNTER — Other Ambulatory Visit: Payer: Self-pay

## 2017-05-18 NOTE — Patient Outreach (Signed)
[facility]) Care Management  05/18/2017  Luke Allen [birth year] [id number]   TELEPHONE SCREENING Referral date: 05/08/17 Referral source: EMMI engagement Referral reason: EMMI engagement score: 12 Insurance: [employer] Attempt #3  Third telephone call to patient regarding EMMI engagement referral. Unable to reach patient or leave voice message. Phone only rang.   PLAN: RNCM will send patient outreach letter to attempt contact.   Quinn Plowman RN,BSN,CCM [facility] Telephonic  [phone]

## 2017-06-01 ENCOUNTER — Other Ambulatory Visit: Payer: Self-pay

## 2017-06-01 NOTE — Patient Outreach (Signed)
[place]) Care Management  06/01/2017  Luke Allen Oct 11, 1934 [id number]  No response from patient after 3 telephone calls and outreach letter attempt.  PLAN: RNCM will refer patient to case management assistant to close due to being unable to reach.  RNCM will send patients primary MD notification of closure.   Quinn Plowman RN,BSN,CCM [facility] Telephonic  [phone]

## 2017-11-16 DIAGNOSIS — Z1389 Encounter for screening for other disorder: Secondary | ICD-10-CM | POA: Diagnosis not present

## 2017-11-16 DIAGNOSIS — Z0000 Encounter for general adult medical examination without abnormal findings: Secondary | ICD-10-CM | POA: Diagnosis not present

## 2017-11-16 DIAGNOSIS — R0609 Other forms of dyspnea: Secondary | ICD-10-CM | POA: Diagnosis not present

## 2017-11-16 DIAGNOSIS — J441 Chronic obstructive pulmonary disease with (acute) exacerbation: Secondary | ICD-10-CM | POA: Diagnosis not present

## 2017-11-16 DIAGNOSIS — I10 Essential (primary) hypertension: Secondary | ICD-10-CM | POA: Diagnosis not present

## 2017-11-16 DIAGNOSIS — Z9181 History of falling: Secondary | ICD-10-CM | POA: Diagnosis not present

## 2017-11-16 DIAGNOSIS — E785 Hyperlipidemia, unspecified: Secondary | ICD-10-CM | POA: Diagnosis not present

## 2017-11-16 DIAGNOSIS — J449 Chronic obstructive pulmonary disease, unspecified: Secondary | ICD-10-CM | POA: Diagnosis not present

## 2017-11-22 IMAGING — DX DG CHEST 2V
2 series · 2 of 2 positions shown · non-contrast
Comparison: Chest radiograph from 07/28/2016

CLINICAL DATA: Subacute onset of shortness of breath and
generalized abdominal pain. Initial encounter.

EXAM:
CHEST  2 VIEW

[x chest ap]
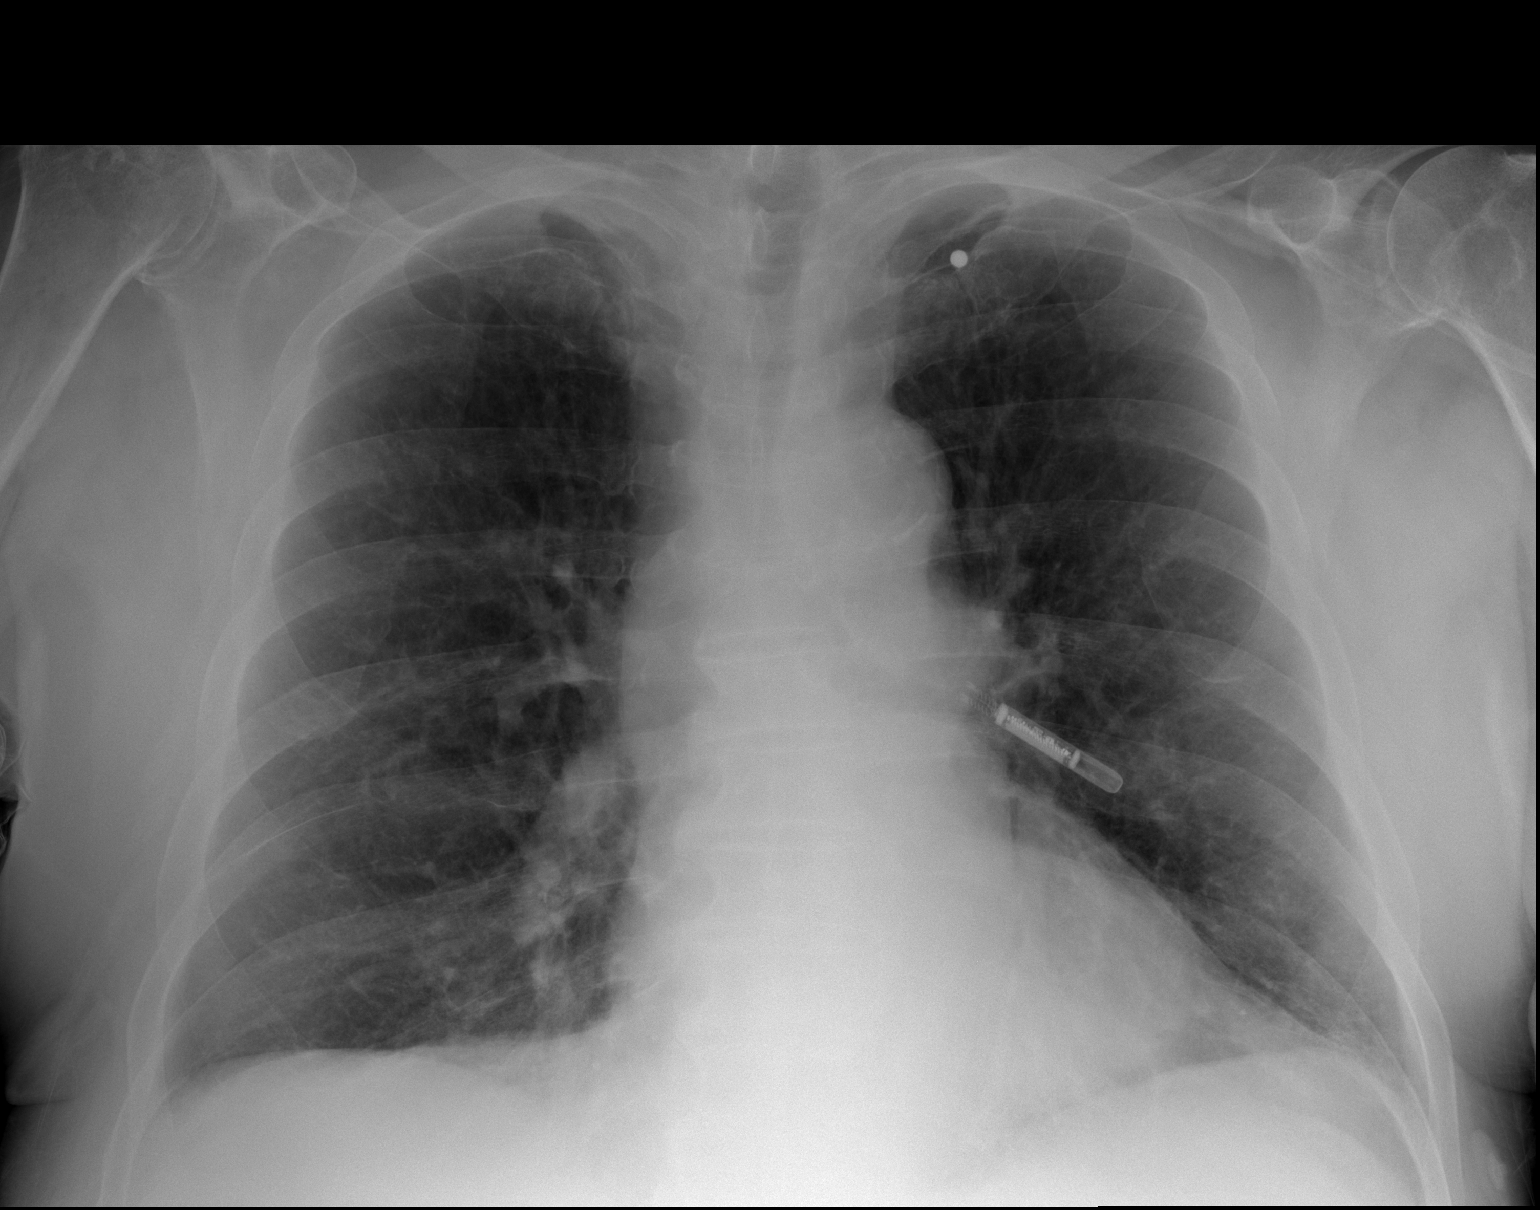

[w chest lat]
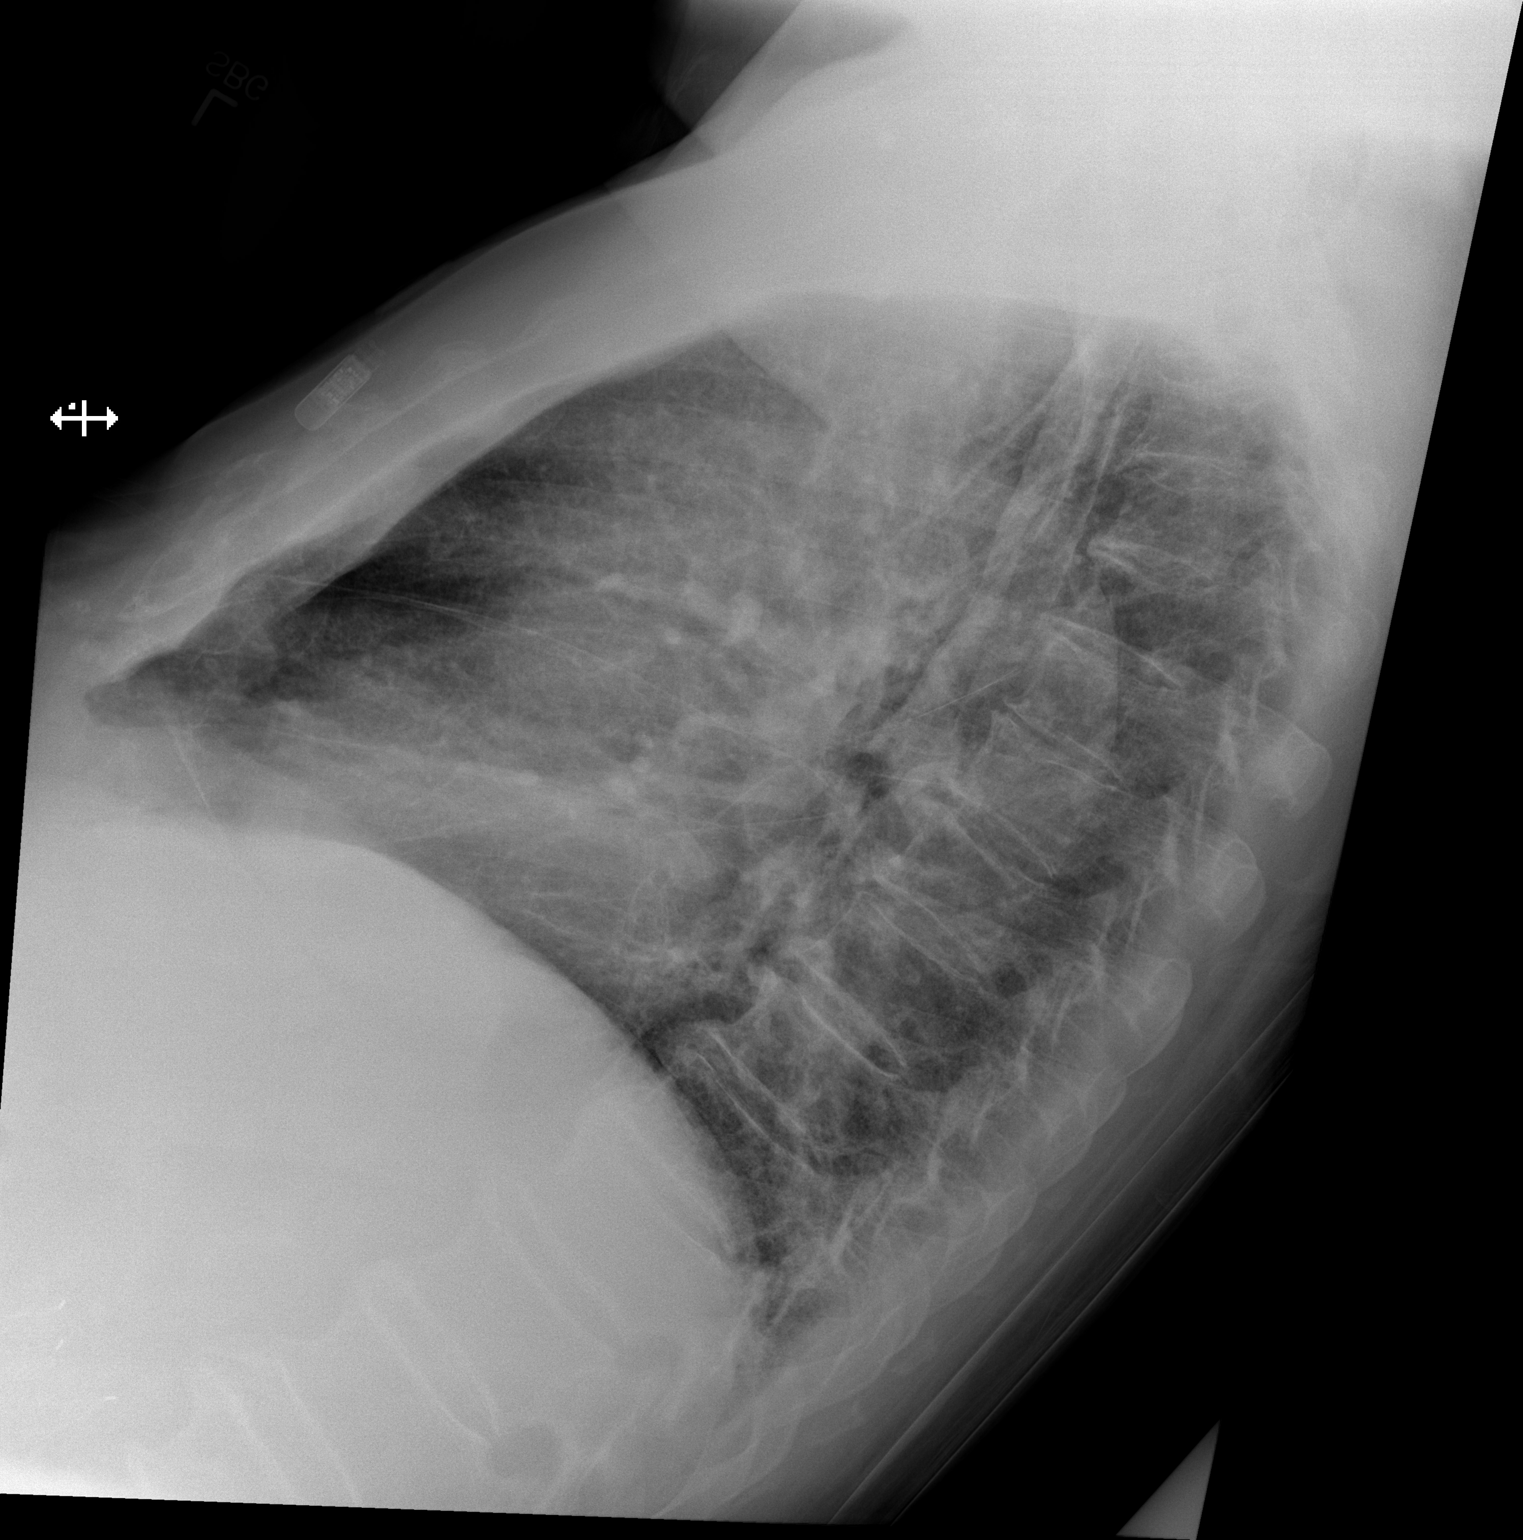

[2 of 2 positions shown; findings below may reference images not displayed]

FINDINGS: The lungs are well-aerated. Minimal left basilar atelectasis is
noted. There is no evidence of focal opacification, pleural effusion
or pneumothorax.

The heart is normal in size; the mediastinal contour is within
normal limits. A loop recorder is noted. No acute osseous
abnormalities are seen.
IMPRESSION: Minimal left basilar atelectasis noted. Lungs otherwise grossly
clear.

## 2018-05-26 DIAGNOSIS — L820 Inflamed seborrheic keratosis: Secondary | ICD-10-CM | POA: Diagnosis not present

## 2018-05-26 DIAGNOSIS — L821 Other seborrheic keratosis: Secondary | ICD-10-CM | POA: Diagnosis not present

## 2018-05-26 DIAGNOSIS — C44529 Squamous cell carcinoma of skin of other part of trunk: Secondary | ICD-10-CM | POA: Diagnosis not present

## 2018-07-27 DIAGNOSIS — J22 Unspecified acute lower respiratory infection: Secondary | ICD-10-CM | POA: Diagnosis not present

## 2018-07-27 DIAGNOSIS — J9621 Acute and chronic respiratory failure with hypoxia: Secondary | ICD-10-CM | POA: Diagnosis not present

## 2018-07-27 DIAGNOSIS — I2510 Atherosclerotic heart disease of native coronary artery without angina pectoris: Secondary | ICD-10-CM

## 2018-07-27 DIAGNOSIS — J441 Chronic obstructive pulmonary disease with (acute) exacerbation: Secondary | ICD-10-CM | POA: Diagnosis not present

## 2018-07-27 DIAGNOSIS — J449 Chronic obstructive pulmonary disease, unspecified: Secondary | ICD-10-CM | POA: Diagnosis not present

## 2018-07-27 DIAGNOSIS — F419 Anxiety disorder, unspecified: Secondary | ICD-10-CM

## 2018-07-27 DIAGNOSIS — R05 Cough: Secondary | ICD-10-CM | POA: Diagnosis not present

## 2018-07-27 DIAGNOSIS — I10 Essential (primary) hypertension: Secondary | ICD-10-CM

## 2018-07-27 DIAGNOSIS — Z720 Tobacco use: Secondary | ICD-10-CM | POA: Diagnosis not present

## 2018-07-27 DIAGNOSIS — G4733 Obstructive sleep apnea (adult) (pediatric): Secondary | ICD-10-CM | POA: Diagnosis not present

## 2018-07-27 DIAGNOSIS — R0902 Hypoxemia: Secondary | ICD-10-CM | POA: Diagnosis not present

## 2018-07-28 DIAGNOSIS — G4733 Obstructive sleep apnea (adult) (pediatric): Secondary | ICD-10-CM | POA: Diagnosis not present

## 2018-07-28 DIAGNOSIS — J441 Chronic obstructive pulmonary disease with (acute) exacerbation: Secondary | ICD-10-CM | POA: Diagnosis not present

## 2018-07-28 DIAGNOSIS — J440 Chronic obstructive pulmonary disease with acute lower respiratory infection: Secondary | ICD-10-CM | POA: Diagnosis not present

## 2018-07-28 DIAGNOSIS — J449 Chronic obstructive pulmonary disease, unspecified: Secondary | ICD-10-CM | POA: Diagnosis not present

## 2018-07-28 DIAGNOSIS — I2510 Atherosclerotic heart disease of native coronary artery without angina pectoris: Secondary | ICD-10-CM | POA: Diagnosis not present

## 2018-07-28 DIAGNOSIS — R0902 Hypoxemia: Secondary | ICD-10-CM | POA: Diagnosis not present

## 2018-07-28 DIAGNOSIS — R05 Cough: Secondary | ICD-10-CM | POA: Diagnosis not present

## 2018-07-28 DIAGNOSIS — Z720 Tobacco use: Secondary | ICD-10-CM | POA: Diagnosis not present

## 2018-07-28 DIAGNOSIS — I252 Old myocardial infarction: Secondary | ICD-10-CM | POA: Diagnosis not present

## 2018-07-28 DIAGNOSIS — N189 Chronic kidney disease, unspecified: Secondary | ICD-10-CM | POA: Diagnosis not present

## 2018-07-28 DIAGNOSIS — Z7982 Long term (current) use of aspirin: Secondary | ICD-10-CM | POA: Diagnosis not present

## 2018-07-28 DIAGNOSIS — Z9861 Coronary angioplasty status: Secondary | ICD-10-CM | POA: Diagnosis not present

## 2018-07-28 DIAGNOSIS — N400 Enlarged prostate without lower urinary tract symptoms: Secondary | ICD-10-CM

## 2018-07-28 DIAGNOSIS — M1990 Unspecified osteoarthritis, unspecified site: Secondary | ICD-10-CM | POA: Diagnosis not present

## 2018-07-28 DIAGNOSIS — Z8673 Personal history of transient ischemic attack (TIA), and cerebral infarction without residual deficits: Secondary | ICD-10-CM | POA: Diagnosis not present

## 2018-07-28 DIAGNOSIS — I129 Hypertensive chronic kidney disease with stage 1 through stage 4 chronic kidney disease, or unspecified chronic kidney disease: Secondary | ICD-10-CM | POA: Diagnosis not present

## 2018-07-28 DIAGNOSIS — E1165 Type 2 diabetes mellitus with hyperglycemia: Secondary | ICD-10-CM | POA: Diagnosis not present

## 2018-07-28 DIAGNOSIS — N179 Acute kidney failure, unspecified: Secondary | ICD-10-CM | POA: Diagnosis not present

## 2018-07-28 DIAGNOSIS — Z79899 Other long term (current) drug therapy: Secondary | ICD-10-CM | POA: Diagnosis not present

## 2018-07-28 DIAGNOSIS — E1122 Type 2 diabetes mellitus with diabetic chronic kidney disease: Secondary | ICD-10-CM | POA: Diagnosis not present

## 2018-07-28 DIAGNOSIS — J9601 Acute respiratory failure with hypoxia: Secondary | ICD-10-CM | POA: Diagnosis not present

## 2018-07-28 DIAGNOSIS — J22 Unspecified acute lower respiratory infection: Secondary | ICD-10-CM | POA: Diagnosis not present

## 2018-08-02 DIAGNOSIS — I129 Hypertensive chronic kidney disease with stage 1 through stage 4 chronic kidney disease, or unspecified chronic kidney disease: Secondary | ICD-10-CM | POA: Diagnosis not present

## 2018-08-02 DIAGNOSIS — I2510 Atherosclerotic heart disease of native coronary artery without angina pectoris: Secondary | ICD-10-CM | POA: Diagnosis not present

## 2018-08-02 DIAGNOSIS — M1991 Primary osteoarthritis, unspecified site: Secondary | ICD-10-CM | POA: Diagnosis not present

## 2018-08-02 DIAGNOSIS — Z7984 Long term (current) use of oral hypoglycemic drugs: Secondary | ICD-10-CM | POA: Diagnosis not present

## 2018-08-02 DIAGNOSIS — E1122 Type 2 diabetes mellitus with diabetic chronic kidney disease: Secondary | ICD-10-CM | POA: Diagnosis not present

## 2018-08-02 DIAGNOSIS — Z7982 Long term (current) use of aspirin: Secondary | ICD-10-CM | POA: Diagnosis not present

## 2018-08-02 DIAGNOSIS — J440 Chronic obstructive pulmonary disease with acute lower respiratory infection: Secondary | ICD-10-CM | POA: Diagnosis not present

## 2018-08-02 DIAGNOSIS — G8929 Other chronic pain: Secondary | ICD-10-CM | POA: Diagnosis not present

## 2018-08-02 DIAGNOSIS — G4733 Obstructive sleep apnea (adult) (pediatric): Secondary | ICD-10-CM | POA: Diagnosis not present

## 2018-08-02 DIAGNOSIS — J441 Chronic obstructive pulmonary disease with (acute) exacerbation: Secondary | ICD-10-CM | POA: Diagnosis not present

## 2018-08-02 DIAGNOSIS — N189 Chronic kidney disease, unspecified: Secondary | ICD-10-CM | POA: Diagnosis not present

## 2018-08-02 DIAGNOSIS — K219 Gastro-esophageal reflux disease without esophagitis: Secondary | ICD-10-CM | POA: Diagnosis not present

## 2018-08-03 DIAGNOSIS — J441 Chronic obstructive pulmonary disease with (acute) exacerbation: Secondary | ICD-10-CM | POA: Diagnosis not present

## 2018-08-03 DIAGNOSIS — E1165 Type 2 diabetes mellitus with hyperglycemia: Secondary | ICD-10-CM | POA: Diagnosis not present

## 2018-08-03 DIAGNOSIS — I10 Essential (primary) hypertension: Secondary | ICD-10-CM | POA: Diagnosis not present

## 2018-08-03 DIAGNOSIS — I5032 Chronic diastolic (congestive) heart failure: Secondary | ICD-10-CM | POA: Diagnosis not present

## 2018-08-03 DIAGNOSIS — Z79899 Other long term (current) drug therapy: Secondary | ICD-10-CM | POA: Diagnosis not present

## 2018-08-05 DIAGNOSIS — Z7982 Long term (current) use of aspirin: Secondary | ICD-10-CM | POA: Diagnosis not present

## 2018-08-05 DIAGNOSIS — Z7984 Long term (current) use of oral hypoglycemic drugs: Secondary | ICD-10-CM | POA: Diagnosis not present

## 2018-08-05 DIAGNOSIS — I129 Hypertensive chronic kidney disease with stage 1 through stage 4 chronic kidney disease, or unspecified chronic kidney disease: Secondary | ICD-10-CM | POA: Diagnosis not present

## 2018-08-05 DIAGNOSIS — M1991 Primary osteoarthritis, unspecified site: Secondary | ICD-10-CM | POA: Diagnosis not present

## 2018-08-05 DIAGNOSIS — E1122 Type 2 diabetes mellitus with diabetic chronic kidney disease: Secondary | ICD-10-CM | POA: Diagnosis not present

## 2018-08-05 DIAGNOSIS — J440 Chronic obstructive pulmonary disease with acute lower respiratory infection: Secondary | ICD-10-CM | POA: Diagnosis not present

## 2018-08-05 DIAGNOSIS — N189 Chronic kidney disease, unspecified: Secondary | ICD-10-CM | POA: Diagnosis not present

## 2018-08-05 DIAGNOSIS — J441 Chronic obstructive pulmonary disease with (acute) exacerbation: Secondary | ICD-10-CM | POA: Diagnosis not present

## 2018-08-05 DIAGNOSIS — G8929 Other chronic pain: Secondary | ICD-10-CM | POA: Diagnosis not present

## 2018-08-05 DIAGNOSIS — K219 Gastro-esophageal reflux disease without esophagitis: Secondary | ICD-10-CM | POA: Diagnosis not present

## 2018-08-05 DIAGNOSIS — I2510 Atherosclerotic heart disease of native coronary artery without angina pectoris: Secondary | ICD-10-CM | POA: Diagnosis not present

## 2018-08-05 DIAGNOSIS — G4733 Obstructive sleep apnea (adult) (pediatric): Secondary | ICD-10-CM | POA: Diagnosis not present

## 2018-08-06 DIAGNOSIS — N189 Chronic kidney disease, unspecified: Secondary | ICD-10-CM | POA: Diagnosis not present

## 2018-08-06 DIAGNOSIS — M1991 Primary osteoarthritis, unspecified site: Secondary | ICD-10-CM | POA: Diagnosis not present

## 2018-08-06 DIAGNOSIS — I129 Hypertensive chronic kidney disease with stage 1 through stage 4 chronic kidney disease, or unspecified chronic kidney disease: Secondary | ICD-10-CM | POA: Diagnosis not present

## 2018-08-06 DIAGNOSIS — G8929 Other chronic pain: Secondary | ICD-10-CM | POA: Diagnosis not present

## 2018-08-06 DIAGNOSIS — J440 Chronic obstructive pulmonary disease with acute lower respiratory infection: Secondary | ICD-10-CM | POA: Diagnosis not present

## 2018-08-06 DIAGNOSIS — Z7984 Long term (current) use of oral hypoglycemic drugs: Secondary | ICD-10-CM | POA: Diagnosis not present

## 2018-08-06 DIAGNOSIS — G4733 Obstructive sleep apnea (adult) (pediatric): Secondary | ICD-10-CM | POA: Diagnosis not present

## 2018-08-06 DIAGNOSIS — E1122 Type 2 diabetes mellitus with diabetic chronic kidney disease: Secondary | ICD-10-CM | POA: Diagnosis not present

## 2018-08-06 DIAGNOSIS — I2510 Atherosclerotic heart disease of native coronary artery without angina pectoris: Secondary | ICD-10-CM | POA: Diagnosis not present

## 2018-08-06 DIAGNOSIS — K219 Gastro-esophageal reflux disease without esophagitis: Secondary | ICD-10-CM | POA: Diagnosis not present

## 2018-08-06 DIAGNOSIS — Z7982 Long term (current) use of aspirin: Secondary | ICD-10-CM | POA: Diagnosis not present

## 2018-08-06 DIAGNOSIS — J441 Chronic obstructive pulmonary disease with (acute) exacerbation: Secondary | ICD-10-CM | POA: Diagnosis not present

## 2018-08-10 DIAGNOSIS — Z7984 Long term (current) use of oral hypoglycemic drugs: Secondary | ICD-10-CM | POA: Diagnosis not present

## 2018-08-10 DIAGNOSIS — G8929 Other chronic pain: Secondary | ICD-10-CM | POA: Diagnosis not present

## 2018-08-10 DIAGNOSIS — Z7982 Long term (current) use of aspirin: Secondary | ICD-10-CM | POA: Diagnosis not present

## 2018-08-10 DIAGNOSIS — E1122 Type 2 diabetes mellitus with diabetic chronic kidney disease: Secondary | ICD-10-CM | POA: Diagnosis not present

## 2018-08-10 DIAGNOSIS — G4733 Obstructive sleep apnea (adult) (pediatric): Secondary | ICD-10-CM | POA: Diagnosis not present

## 2018-08-10 DIAGNOSIS — M1991 Primary osteoarthritis, unspecified site: Secondary | ICD-10-CM | POA: Diagnosis not present

## 2018-08-10 DIAGNOSIS — K219 Gastro-esophageal reflux disease without esophagitis: Secondary | ICD-10-CM | POA: Diagnosis not present

## 2018-08-10 DIAGNOSIS — J441 Chronic obstructive pulmonary disease with (acute) exacerbation: Secondary | ICD-10-CM | POA: Diagnosis not present

## 2018-08-10 DIAGNOSIS — I2510 Atherosclerotic heart disease of native coronary artery without angina pectoris: Secondary | ICD-10-CM | POA: Diagnosis not present

## 2018-08-10 DIAGNOSIS — I129 Hypertensive chronic kidney disease with stage 1 through stage 4 chronic kidney disease, or unspecified chronic kidney disease: Secondary | ICD-10-CM | POA: Diagnosis not present

## 2018-08-10 DIAGNOSIS — J440 Chronic obstructive pulmonary disease with acute lower respiratory infection: Secondary | ICD-10-CM | POA: Diagnosis not present

## 2018-08-10 DIAGNOSIS — N189 Chronic kidney disease, unspecified: Secondary | ICD-10-CM | POA: Diagnosis not present

## 2018-08-18 DIAGNOSIS — Z7982 Long term (current) use of aspirin: Secondary | ICD-10-CM | POA: Diagnosis not present

## 2018-08-18 DIAGNOSIS — G8929 Other chronic pain: Secondary | ICD-10-CM | POA: Diagnosis not present

## 2018-08-18 DIAGNOSIS — G4733 Obstructive sleep apnea (adult) (pediatric): Secondary | ICD-10-CM | POA: Diagnosis not present

## 2018-08-18 DIAGNOSIS — K219 Gastro-esophageal reflux disease without esophagitis: Secondary | ICD-10-CM | POA: Diagnosis not present

## 2018-08-18 DIAGNOSIS — I2510 Atherosclerotic heart disease of native coronary artery without angina pectoris: Secondary | ICD-10-CM | POA: Diagnosis not present

## 2018-08-18 DIAGNOSIS — N189 Chronic kidney disease, unspecified: Secondary | ICD-10-CM | POA: Diagnosis not present

## 2018-08-18 DIAGNOSIS — M1991 Primary osteoarthritis, unspecified site: Secondary | ICD-10-CM | POA: Diagnosis not present

## 2018-08-18 DIAGNOSIS — J440 Chronic obstructive pulmonary disease with acute lower respiratory infection: Secondary | ICD-10-CM | POA: Diagnosis not present

## 2018-08-18 DIAGNOSIS — J441 Chronic obstructive pulmonary disease with (acute) exacerbation: Secondary | ICD-10-CM | POA: Diagnosis not present

## 2018-08-18 DIAGNOSIS — Z7984 Long term (current) use of oral hypoglycemic drugs: Secondary | ICD-10-CM | POA: Diagnosis not present

## 2018-08-18 DIAGNOSIS — I129 Hypertensive chronic kidney disease with stage 1 through stage 4 chronic kidney disease, or unspecified chronic kidney disease: Secondary | ICD-10-CM | POA: Diagnosis not present

## 2018-08-18 DIAGNOSIS — E1122 Type 2 diabetes mellitus with diabetic chronic kidney disease: Secondary | ICD-10-CM | POA: Diagnosis not present

## 2018-08-20 DIAGNOSIS — Z7982 Long term (current) use of aspirin: Secondary | ICD-10-CM | POA: Diagnosis not present

## 2018-08-20 DIAGNOSIS — E1122 Type 2 diabetes mellitus with diabetic chronic kidney disease: Secondary | ICD-10-CM | POA: Diagnosis not present

## 2018-08-20 DIAGNOSIS — J440 Chronic obstructive pulmonary disease with acute lower respiratory infection: Secondary | ICD-10-CM | POA: Diagnosis not present

## 2018-08-20 DIAGNOSIS — K219 Gastro-esophageal reflux disease without esophagitis: Secondary | ICD-10-CM | POA: Diagnosis not present

## 2018-08-20 DIAGNOSIS — Z7984 Long term (current) use of oral hypoglycemic drugs: Secondary | ICD-10-CM | POA: Diagnosis not present

## 2018-08-20 DIAGNOSIS — N189 Chronic kidney disease, unspecified: Secondary | ICD-10-CM | POA: Diagnosis not present

## 2018-08-20 DIAGNOSIS — I2510 Atherosclerotic heart disease of native coronary artery without angina pectoris: Secondary | ICD-10-CM | POA: Diagnosis not present

## 2018-08-20 DIAGNOSIS — G4733 Obstructive sleep apnea (adult) (pediatric): Secondary | ICD-10-CM | POA: Diagnosis not present

## 2018-08-20 DIAGNOSIS — G8929 Other chronic pain: Secondary | ICD-10-CM | POA: Diagnosis not present

## 2018-08-20 DIAGNOSIS — M1991 Primary osteoarthritis, unspecified site: Secondary | ICD-10-CM | POA: Diagnosis not present

## 2018-08-20 DIAGNOSIS — J441 Chronic obstructive pulmonary disease with (acute) exacerbation: Secondary | ICD-10-CM | POA: Diagnosis not present

## 2018-08-20 DIAGNOSIS — I129 Hypertensive chronic kidney disease with stage 1 through stage 4 chronic kidney disease, or unspecified chronic kidney disease: Secondary | ICD-10-CM | POA: Diagnosis not present

## 2018-08-23 DIAGNOSIS — J440 Chronic obstructive pulmonary disease with acute lower respiratory infection: Secondary | ICD-10-CM | POA: Diagnosis not present

## 2018-08-23 DIAGNOSIS — Z7982 Long term (current) use of aspirin: Secondary | ICD-10-CM | POA: Diagnosis not present

## 2018-08-23 DIAGNOSIS — Z7984 Long term (current) use of oral hypoglycemic drugs: Secondary | ICD-10-CM | POA: Diagnosis not present

## 2018-08-23 DIAGNOSIS — I129 Hypertensive chronic kidney disease with stage 1 through stage 4 chronic kidney disease, or unspecified chronic kidney disease: Secondary | ICD-10-CM | POA: Diagnosis not present

## 2018-08-23 DIAGNOSIS — I2510 Atherosclerotic heart disease of native coronary artery without angina pectoris: Secondary | ICD-10-CM | POA: Diagnosis not present

## 2018-08-23 DIAGNOSIS — N189 Chronic kidney disease, unspecified: Secondary | ICD-10-CM | POA: Diagnosis not present

## 2018-08-23 DIAGNOSIS — G8929 Other chronic pain: Secondary | ICD-10-CM | POA: Diagnosis not present

## 2018-08-23 DIAGNOSIS — J441 Chronic obstructive pulmonary disease with (acute) exacerbation: Secondary | ICD-10-CM | POA: Diagnosis not present

## 2018-08-23 DIAGNOSIS — M1991 Primary osteoarthritis, unspecified site: Secondary | ICD-10-CM | POA: Diagnosis not present

## 2018-08-23 DIAGNOSIS — K219 Gastro-esophageal reflux disease without esophagitis: Secondary | ICD-10-CM | POA: Diagnosis not present

## 2018-08-23 DIAGNOSIS — G4733 Obstructive sleep apnea (adult) (pediatric): Secondary | ICD-10-CM | POA: Diagnosis not present

## 2018-08-23 DIAGNOSIS — E1122 Type 2 diabetes mellitus with diabetic chronic kidney disease: Secondary | ICD-10-CM | POA: Diagnosis not present

## 2018-08-24 DIAGNOSIS — I10 Essential (primary) hypertension: Secondary | ICD-10-CM | POA: Diagnosis not present

## 2018-08-24 DIAGNOSIS — E1165 Type 2 diabetes mellitus with hyperglycemia: Secondary | ICD-10-CM | POA: Diagnosis not present

## 2018-08-24 DIAGNOSIS — J441 Chronic obstructive pulmonary disease with (acute) exacerbation: Secondary | ICD-10-CM | POA: Diagnosis not present

## 2018-08-24 DIAGNOSIS — R21 Rash and other nonspecific skin eruption: Secondary | ICD-10-CM | POA: Diagnosis not present

## 2018-08-24 DIAGNOSIS — I5032 Chronic diastolic (congestive) heart failure: Secondary | ICD-10-CM | POA: Diagnosis not present

## 2018-08-25 DIAGNOSIS — Z7982 Long term (current) use of aspirin: Secondary | ICD-10-CM | POA: Diagnosis not present

## 2018-08-25 DIAGNOSIS — K219 Gastro-esophageal reflux disease without esophagitis: Secondary | ICD-10-CM | POA: Diagnosis not present

## 2018-08-25 DIAGNOSIS — I2510 Atherosclerotic heart disease of native coronary artery without angina pectoris: Secondary | ICD-10-CM | POA: Diagnosis not present

## 2018-08-25 DIAGNOSIS — G4733 Obstructive sleep apnea (adult) (pediatric): Secondary | ICD-10-CM | POA: Diagnosis not present

## 2018-08-25 DIAGNOSIS — I129 Hypertensive chronic kidney disease with stage 1 through stage 4 chronic kidney disease, or unspecified chronic kidney disease: Secondary | ICD-10-CM | POA: Diagnosis not present

## 2018-08-25 DIAGNOSIS — M1991 Primary osteoarthritis, unspecified site: Secondary | ICD-10-CM | POA: Diagnosis not present

## 2018-08-25 DIAGNOSIS — G8929 Other chronic pain: Secondary | ICD-10-CM | POA: Diagnosis not present

## 2018-08-25 DIAGNOSIS — N189 Chronic kidney disease, unspecified: Secondary | ICD-10-CM | POA: Diagnosis not present

## 2018-08-25 DIAGNOSIS — Z7984 Long term (current) use of oral hypoglycemic drugs: Secondary | ICD-10-CM | POA: Diagnosis not present

## 2018-08-25 DIAGNOSIS — E1122 Type 2 diabetes mellitus with diabetic chronic kidney disease: Secondary | ICD-10-CM | POA: Diagnosis not present

## 2018-08-25 DIAGNOSIS — J440 Chronic obstructive pulmonary disease with acute lower respiratory infection: Secondary | ICD-10-CM | POA: Diagnosis not present

## 2018-08-25 DIAGNOSIS — J441 Chronic obstructive pulmonary disease with (acute) exacerbation: Secondary | ICD-10-CM | POA: Diagnosis not present

## 2018-08-31 DIAGNOSIS — M1991 Primary osteoarthritis, unspecified site: Secondary | ICD-10-CM | POA: Diagnosis not present

## 2018-08-31 DIAGNOSIS — I129 Hypertensive chronic kidney disease with stage 1 through stage 4 chronic kidney disease, or unspecified chronic kidney disease: Secondary | ICD-10-CM | POA: Diagnosis not present

## 2018-08-31 DIAGNOSIS — G4733 Obstructive sleep apnea (adult) (pediatric): Secondary | ICD-10-CM | POA: Diagnosis not present

## 2018-08-31 DIAGNOSIS — G8929 Other chronic pain: Secondary | ICD-10-CM | POA: Diagnosis not present

## 2018-08-31 DIAGNOSIS — J440 Chronic obstructive pulmonary disease with acute lower respiratory infection: Secondary | ICD-10-CM | POA: Diagnosis not present

## 2018-08-31 DIAGNOSIS — Z7982 Long term (current) use of aspirin: Secondary | ICD-10-CM | POA: Diagnosis not present

## 2018-08-31 DIAGNOSIS — J441 Chronic obstructive pulmonary disease with (acute) exacerbation: Secondary | ICD-10-CM | POA: Diagnosis not present

## 2018-08-31 DIAGNOSIS — N189 Chronic kidney disease, unspecified: Secondary | ICD-10-CM | POA: Diagnosis not present

## 2018-08-31 DIAGNOSIS — I2510 Atherosclerotic heart disease of native coronary artery without angina pectoris: Secondary | ICD-10-CM | POA: Diagnosis not present

## 2018-08-31 DIAGNOSIS — Z7984 Long term (current) use of oral hypoglycemic drugs: Secondary | ICD-10-CM | POA: Diagnosis not present

## 2018-08-31 DIAGNOSIS — E1122 Type 2 diabetes mellitus with diabetic chronic kidney disease: Secondary | ICD-10-CM | POA: Diagnosis not present

## 2018-08-31 DIAGNOSIS — K219 Gastro-esophageal reflux disease without esophagitis: Secondary | ICD-10-CM | POA: Diagnosis not present

## 2018-09-01 DIAGNOSIS — M1991 Primary osteoarthritis, unspecified site: Secondary | ICD-10-CM | POA: Diagnosis not present

## 2018-09-01 DIAGNOSIS — J441 Chronic obstructive pulmonary disease with (acute) exacerbation: Secondary | ICD-10-CM | POA: Diagnosis not present

## 2018-09-01 DIAGNOSIS — G8929 Other chronic pain: Secondary | ICD-10-CM | POA: Diagnosis not present

## 2018-09-01 DIAGNOSIS — I2510 Atherosclerotic heart disease of native coronary artery without angina pectoris: Secondary | ICD-10-CM | POA: Diagnosis not present

## 2018-09-01 DIAGNOSIS — J440 Chronic obstructive pulmonary disease with acute lower respiratory infection: Secondary | ICD-10-CM | POA: Diagnosis not present

## 2018-09-01 DIAGNOSIS — N189 Chronic kidney disease, unspecified: Secondary | ICD-10-CM | POA: Diagnosis not present

## 2018-09-01 DIAGNOSIS — E1122 Type 2 diabetes mellitus with diabetic chronic kidney disease: Secondary | ICD-10-CM | POA: Diagnosis not present

## 2018-09-01 DIAGNOSIS — G4733 Obstructive sleep apnea (adult) (pediatric): Secondary | ICD-10-CM | POA: Diagnosis not present

## 2018-09-01 DIAGNOSIS — Z7982 Long term (current) use of aspirin: Secondary | ICD-10-CM | POA: Diagnosis not present

## 2018-09-01 DIAGNOSIS — K219 Gastro-esophageal reflux disease without esophagitis: Secondary | ICD-10-CM | POA: Diagnosis not present

## 2018-09-01 DIAGNOSIS — I129 Hypertensive chronic kidney disease with stage 1 through stage 4 chronic kidney disease, or unspecified chronic kidney disease: Secondary | ICD-10-CM | POA: Diagnosis not present

## 2018-09-01 DIAGNOSIS — Z7984 Long term (current) use of oral hypoglycemic drugs: Secondary | ICD-10-CM | POA: Diagnosis not present

## 2018-09-10 DIAGNOSIS — M1991 Primary osteoarthritis, unspecified site: Secondary | ICD-10-CM | POA: Diagnosis not present

## 2018-09-10 DIAGNOSIS — Z7982 Long term (current) use of aspirin: Secondary | ICD-10-CM | POA: Diagnosis not present

## 2018-09-10 DIAGNOSIS — G8929 Other chronic pain: Secondary | ICD-10-CM | POA: Diagnosis not present

## 2018-09-10 DIAGNOSIS — Z7984 Long term (current) use of oral hypoglycemic drugs: Secondary | ICD-10-CM | POA: Diagnosis not present

## 2018-09-10 DIAGNOSIS — G4733 Obstructive sleep apnea (adult) (pediatric): Secondary | ICD-10-CM | POA: Diagnosis not present

## 2018-09-10 DIAGNOSIS — K219 Gastro-esophageal reflux disease without esophagitis: Secondary | ICD-10-CM | POA: Diagnosis not present

## 2018-09-10 DIAGNOSIS — J441 Chronic obstructive pulmonary disease with (acute) exacerbation: Secondary | ICD-10-CM | POA: Diagnosis not present

## 2018-09-10 DIAGNOSIS — J440 Chronic obstructive pulmonary disease with acute lower respiratory infection: Secondary | ICD-10-CM | POA: Diagnosis not present

## 2018-09-10 DIAGNOSIS — E1122 Type 2 diabetes mellitus with diabetic chronic kidney disease: Secondary | ICD-10-CM | POA: Diagnosis not present

## 2018-09-10 DIAGNOSIS — I2510 Atherosclerotic heart disease of native coronary artery without angina pectoris: Secondary | ICD-10-CM | POA: Diagnosis not present

## 2018-09-10 DIAGNOSIS — I129 Hypertensive chronic kidney disease with stage 1 through stage 4 chronic kidney disease, or unspecified chronic kidney disease: Secondary | ICD-10-CM | POA: Diagnosis not present

## 2018-09-10 DIAGNOSIS — N189 Chronic kidney disease, unspecified: Secondary | ICD-10-CM | POA: Diagnosis not present

## 2018-09-15 DIAGNOSIS — N189 Chronic kidney disease, unspecified: Secondary | ICD-10-CM | POA: Diagnosis not present

## 2018-09-15 DIAGNOSIS — J449 Chronic obstructive pulmonary disease, unspecified: Secondary | ICD-10-CM | POA: Diagnosis not present

## 2018-09-15 DIAGNOSIS — Z7982 Long term (current) use of aspirin: Secondary | ICD-10-CM | POA: Diagnosis not present

## 2018-09-15 DIAGNOSIS — K219 Gastro-esophageal reflux disease without esophagitis: Secondary | ICD-10-CM | POA: Diagnosis not present

## 2018-09-15 DIAGNOSIS — G4733 Obstructive sleep apnea (adult) (pediatric): Secondary | ICD-10-CM | POA: Diagnosis not present

## 2018-09-15 DIAGNOSIS — J441 Chronic obstructive pulmonary disease with (acute) exacerbation: Secondary | ICD-10-CM | POA: Diagnosis not present

## 2018-09-15 DIAGNOSIS — G8929 Other chronic pain: Secondary | ICD-10-CM | POA: Diagnosis not present

## 2018-09-15 DIAGNOSIS — Z7984 Long term (current) use of oral hypoglycemic drugs: Secondary | ICD-10-CM | POA: Diagnosis not present

## 2018-09-15 DIAGNOSIS — J440 Chronic obstructive pulmonary disease with acute lower respiratory infection: Secondary | ICD-10-CM | POA: Diagnosis not present

## 2018-09-15 DIAGNOSIS — I2510 Atherosclerotic heart disease of native coronary artery without angina pectoris: Secondary | ICD-10-CM | POA: Diagnosis not present

## 2018-09-15 DIAGNOSIS — E1122 Type 2 diabetes mellitus with diabetic chronic kidney disease: Secondary | ICD-10-CM | POA: Diagnosis not present

## 2018-09-15 DIAGNOSIS — M1991 Primary osteoarthritis, unspecified site: Secondary | ICD-10-CM | POA: Diagnosis not present

## 2018-09-15 DIAGNOSIS — I129 Hypertensive chronic kidney disease with stage 1 through stage 4 chronic kidney disease, or unspecified chronic kidney disease: Secondary | ICD-10-CM | POA: Diagnosis not present

## 2018-09-20 DIAGNOSIS — I129 Hypertensive chronic kidney disease with stage 1 through stage 4 chronic kidney disease, or unspecified chronic kidney disease: Secondary | ICD-10-CM | POA: Diagnosis not present

## 2018-09-20 DIAGNOSIS — Z7984 Long term (current) use of oral hypoglycemic drugs: Secondary | ICD-10-CM | POA: Diagnosis not present

## 2018-09-20 DIAGNOSIS — G4733 Obstructive sleep apnea (adult) (pediatric): Secondary | ICD-10-CM | POA: Diagnosis not present

## 2018-09-20 DIAGNOSIS — G8929 Other chronic pain: Secondary | ICD-10-CM | POA: Diagnosis not present

## 2018-09-20 DIAGNOSIS — I2510 Atherosclerotic heart disease of native coronary artery without angina pectoris: Secondary | ICD-10-CM | POA: Diagnosis not present

## 2018-09-20 DIAGNOSIS — K219 Gastro-esophageal reflux disease without esophagitis: Secondary | ICD-10-CM | POA: Diagnosis not present

## 2018-09-20 DIAGNOSIS — N189 Chronic kidney disease, unspecified: Secondary | ICD-10-CM | POA: Diagnosis not present

## 2018-09-20 DIAGNOSIS — J441 Chronic obstructive pulmonary disease with (acute) exacerbation: Secondary | ICD-10-CM | POA: Diagnosis not present

## 2018-09-20 DIAGNOSIS — J449 Chronic obstructive pulmonary disease, unspecified: Secondary | ICD-10-CM | POA: Diagnosis not present

## 2018-09-20 DIAGNOSIS — E1122 Type 2 diabetes mellitus with diabetic chronic kidney disease: Secondary | ICD-10-CM | POA: Diagnosis not present

## 2018-09-20 DIAGNOSIS — J440 Chronic obstructive pulmonary disease with acute lower respiratory infection: Secondary | ICD-10-CM | POA: Diagnosis not present

## 2018-09-20 DIAGNOSIS — Z7982 Long term (current) use of aspirin: Secondary | ICD-10-CM | POA: Diagnosis not present

## 2018-09-20 DIAGNOSIS — M1991 Primary osteoarthritis, unspecified site: Secondary | ICD-10-CM | POA: Diagnosis not present

## 2018-09-28 DIAGNOSIS — Z7984 Long term (current) use of oral hypoglycemic drugs: Secondary | ICD-10-CM | POA: Diagnosis not present

## 2018-09-28 DIAGNOSIS — G8929 Other chronic pain: Secondary | ICD-10-CM | POA: Diagnosis not present

## 2018-09-28 DIAGNOSIS — E1122 Type 2 diabetes mellitus with diabetic chronic kidney disease: Secondary | ICD-10-CM | POA: Diagnosis not present

## 2018-09-28 DIAGNOSIS — J449 Chronic obstructive pulmonary disease, unspecified: Secondary | ICD-10-CM | POA: Diagnosis not present

## 2018-09-28 DIAGNOSIS — Z7982 Long term (current) use of aspirin: Secondary | ICD-10-CM | POA: Diagnosis not present

## 2018-09-28 DIAGNOSIS — I2510 Atherosclerotic heart disease of native coronary artery without angina pectoris: Secondary | ICD-10-CM | POA: Diagnosis not present

## 2018-09-28 DIAGNOSIS — G4733 Obstructive sleep apnea (adult) (pediatric): Secondary | ICD-10-CM | POA: Diagnosis not present

## 2018-09-28 DIAGNOSIS — M1991 Primary osteoarthritis, unspecified site: Secondary | ICD-10-CM | POA: Diagnosis not present

## 2018-09-28 DIAGNOSIS — J441 Chronic obstructive pulmonary disease with (acute) exacerbation: Secondary | ICD-10-CM | POA: Diagnosis not present

## 2018-09-28 DIAGNOSIS — N189 Chronic kidney disease, unspecified: Secondary | ICD-10-CM | POA: Diagnosis not present

## 2018-09-28 DIAGNOSIS — I129 Hypertensive chronic kidney disease with stage 1 through stage 4 chronic kidney disease, or unspecified chronic kidney disease: Secondary | ICD-10-CM | POA: Diagnosis not present

## 2018-09-28 DIAGNOSIS — K219 Gastro-esophageal reflux disease without esophagitis: Secondary | ICD-10-CM | POA: Diagnosis not present

## 2018-09-28 DIAGNOSIS — J440 Chronic obstructive pulmonary disease with acute lower respiratory infection: Secondary | ICD-10-CM | POA: Diagnosis not present

## 2018-09-29 DIAGNOSIS — E1122 Type 2 diabetes mellitus with diabetic chronic kidney disease: Secondary | ICD-10-CM | POA: Diagnosis not present

## 2018-09-29 DIAGNOSIS — Z7984 Long term (current) use of oral hypoglycemic drugs: Secondary | ICD-10-CM | POA: Diagnosis not present

## 2018-09-29 DIAGNOSIS — G4733 Obstructive sleep apnea (adult) (pediatric): Secondary | ICD-10-CM | POA: Diagnosis not present

## 2018-09-29 DIAGNOSIS — K219 Gastro-esophageal reflux disease without esophagitis: Secondary | ICD-10-CM | POA: Diagnosis not present

## 2018-09-29 DIAGNOSIS — G8929 Other chronic pain: Secondary | ICD-10-CM | POA: Diagnosis not present

## 2018-09-29 DIAGNOSIS — I2510 Atherosclerotic heart disease of native coronary artery without angina pectoris: Secondary | ICD-10-CM | POA: Diagnosis not present

## 2018-09-29 DIAGNOSIS — J440 Chronic obstructive pulmonary disease with acute lower respiratory infection: Secondary | ICD-10-CM | POA: Diagnosis not present

## 2018-09-29 DIAGNOSIS — M1991 Primary osteoarthritis, unspecified site: Secondary | ICD-10-CM | POA: Diagnosis not present

## 2018-09-29 DIAGNOSIS — J441 Chronic obstructive pulmonary disease with (acute) exacerbation: Secondary | ICD-10-CM | POA: Diagnosis not present

## 2018-09-29 DIAGNOSIS — I129 Hypertensive chronic kidney disease with stage 1 through stage 4 chronic kidney disease, or unspecified chronic kidney disease: Secondary | ICD-10-CM | POA: Diagnosis not present

## 2018-09-29 DIAGNOSIS — J449 Chronic obstructive pulmonary disease, unspecified: Secondary | ICD-10-CM | POA: Diagnosis not present

## 2018-09-29 DIAGNOSIS — N189 Chronic kidney disease, unspecified: Secondary | ICD-10-CM | POA: Diagnosis not present

## 2018-09-29 DIAGNOSIS — Z7982 Long term (current) use of aspirin: Secondary | ICD-10-CM | POA: Diagnosis not present

## 2018-10-14 DIAGNOSIS — J449 Chronic obstructive pulmonary disease, unspecified: Secondary | ICD-10-CM | POA: Diagnosis not present

## 2018-10-14 DIAGNOSIS — K219 Gastro-esophageal reflux disease without esophagitis: Secondary | ICD-10-CM | POA: Diagnosis not present

## 2018-10-14 DIAGNOSIS — G8929 Other chronic pain: Secondary | ICD-10-CM | POA: Diagnosis not present

## 2018-10-14 DIAGNOSIS — E1122 Type 2 diabetes mellitus with diabetic chronic kidney disease: Secondary | ICD-10-CM | POA: Diagnosis not present

## 2018-10-14 DIAGNOSIS — N189 Chronic kidney disease, unspecified: Secondary | ICD-10-CM | POA: Diagnosis not present

## 2018-10-14 DIAGNOSIS — Z7984 Long term (current) use of oral hypoglycemic drugs: Secondary | ICD-10-CM | POA: Diagnosis not present

## 2018-10-14 DIAGNOSIS — G4733 Obstructive sleep apnea (adult) (pediatric): Secondary | ICD-10-CM | POA: Diagnosis not present

## 2018-10-14 DIAGNOSIS — M1991 Primary osteoarthritis, unspecified site: Secondary | ICD-10-CM | POA: Diagnosis not present

## 2018-10-14 DIAGNOSIS — I129 Hypertensive chronic kidney disease with stage 1 through stage 4 chronic kidney disease, or unspecified chronic kidney disease: Secondary | ICD-10-CM | POA: Diagnosis not present

## 2018-10-14 DIAGNOSIS — I2510 Atherosclerotic heart disease of native coronary artery without angina pectoris: Secondary | ICD-10-CM | POA: Diagnosis not present

## 2018-10-14 DIAGNOSIS — Z7982 Long term (current) use of aspirin: Secondary | ICD-10-CM | POA: Diagnosis not present

## 2018-11-02 DIAGNOSIS — Z7982 Long term (current) use of aspirin: Secondary | ICD-10-CM | POA: Diagnosis not present

## 2018-11-02 DIAGNOSIS — E1122 Type 2 diabetes mellitus with diabetic chronic kidney disease: Secondary | ICD-10-CM | POA: Diagnosis not present

## 2018-11-02 DIAGNOSIS — G8929 Other chronic pain: Secondary | ICD-10-CM | POA: Diagnosis not present

## 2018-11-02 DIAGNOSIS — M1991 Primary osteoarthritis, unspecified site: Secondary | ICD-10-CM | POA: Diagnosis not present

## 2018-11-02 DIAGNOSIS — G4733 Obstructive sleep apnea (adult) (pediatric): Secondary | ICD-10-CM | POA: Diagnosis not present

## 2018-11-02 DIAGNOSIS — J449 Chronic obstructive pulmonary disease, unspecified: Secondary | ICD-10-CM | POA: Diagnosis not present

## 2018-11-02 DIAGNOSIS — K219 Gastro-esophageal reflux disease without esophagitis: Secondary | ICD-10-CM | POA: Diagnosis not present

## 2018-11-02 DIAGNOSIS — N189 Chronic kidney disease, unspecified: Secondary | ICD-10-CM | POA: Diagnosis not present

## 2018-11-02 DIAGNOSIS — Z7984 Long term (current) use of oral hypoglycemic drugs: Secondary | ICD-10-CM | POA: Diagnosis not present

## 2018-11-02 DIAGNOSIS — I129 Hypertensive chronic kidney disease with stage 1 through stage 4 chronic kidney disease, or unspecified chronic kidney disease: Secondary | ICD-10-CM | POA: Diagnosis not present

## 2018-11-02 DIAGNOSIS — I2510 Atherosclerotic heart disease of native coronary artery without angina pectoris: Secondary | ICD-10-CM | POA: Diagnosis not present

## 2018-11-04 DIAGNOSIS — I5032 Chronic diastolic (congestive) heart failure: Secondary | ICD-10-CM | POA: Diagnosis not present

## 2018-11-04 DIAGNOSIS — I10 Essential (primary) hypertension: Secondary | ICD-10-CM | POA: Diagnosis not present

## 2018-11-04 DIAGNOSIS — N289 Disorder of kidney and ureter, unspecified: Secondary | ICD-10-CM | POA: Diagnosis not present

## 2018-11-04 DIAGNOSIS — E1165 Type 2 diabetes mellitus with hyperglycemia: Secondary | ICD-10-CM | POA: Diagnosis not present

## 2018-11-08 DIAGNOSIS — N189 Chronic kidney disease, unspecified: Secondary | ICD-10-CM | POA: Diagnosis not present

## 2018-11-08 DIAGNOSIS — E1122 Type 2 diabetes mellitus with diabetic chronic kidney disease: Secondary | ICD-10-CM | POA: Diagnosis not present

## 2018-11-08 DIAGNOSIS — M1991 Primary osteoarthritis, unspecified site: Secondary | ICD-10-CM | POA: Diagnosis not present

## 2018-11-08 DIAGNOSIS — G4733 Obstructive sleep apnea (adult) (pediatric): Secondary | ICD-10-CM | POA: Diagnosis not present

## 2018-11-08 DIAGNOSIS — I2510 Atherosclerotic heart disease of native coronary artery without angina pectoris: Secondary | ICD-10-CM | POA: Diagnosis not present

## 2018-11-08 DIAGNOSIS — K219 Gastro-esophageal reflux disease without esophagitis: Secondary | ICD-10-CM | POA: Diagnosis not present

## 2018-11-08 DIAGNOSIS — Z7982 Long term (current) use of aspirin: Secondary | ICD-10-CM | POA: Diagnosis not present

## 2018-11-08 DIAGNOSIS — J449 Chronic obstructive pulmonary disease, unspecified: Secondary | ICD-10-CM | POA: Diagnosis not present

## 2018-11-08 DIAGNOSIS — G8929 Other chronic pain: Secondary | ICD-10-CM | POA: Diagnosis not present

## 2018-11-08 DIAGNOSIS — Z7984 Long term (current) use of oral hypoglycemic drugs: Secondary | ICD-10-CM | POA: Diagnosis not present

## 2018-11-08 DIAGNOSIS — I129 Hypertensive chronic kidney disease with stage 1 through stage 4 chronic kidney disease, or unspecified chronic kidney disease: Secondary | ICD-10-CM | POA: Diagnosis not present

## 2018-11-18 DIAGNOSIS — Z0000 Encounter for general adult medical examination without abnormal findings: Secondary | ICD-10-CM | POA: Diagnosis not present

## 2018-11-18 DIAGNOSIS — Z136 Encounter for screening for cardiovascular disorders: Secondary | ICD-10-CM | POA: Diagnosis not present

## 2018-11-18 DIAGNOSIS — Z9181 History of falling: Secondary | ICD-10-CM | POA: Diagnosis not present

## 2018-11-18 DIAGNOSIS — E785 Hyperlipidemia, unspecified: Secondary | ICD-10-CM | POA: Diagnosis not present

## 2018-11-18 DIAGNOSIS — Z139 Encounter for screening, unspecified: Secondary | ICD-10-CM | POA: Diagnosis not present

## 2018-11-25 DIAGNOSIS — J449 Chronic obstructive pulmonary disease, unspecified: Secondary | ICD-10-CM | POA: Diagnosis not present

## 2018-11-25 DIAGNOSIS — G4733 Obstructive sleep apnea (adult) (pediatric): Secondary | ICD-10-CM | POA: Diagnosis not present

## 2018-11-25 DIAGNOSIS — N189 Chronic kidney disease, unspecified: Secondary | ICD-10-CM | POA: Diagnosis not present

## 2018-11-25 DIAGNOSIS — Z7982 Long term (current) use of aspirin: Secondary | ICD-10-CM | POA: Diagnosis not present

## 2018-11-25 DIAGNOSIS — M1991 Primary osteoarthritis, unspecified site: Secondary | ICD-10-CM | POA: Diagnosis not present

## 2018-11-25 DIAGNOSIS — Z7984 Long term (current) use of oral hypoglycemic drugs: Secondary | ICD-10-CM | POA: Diagnosis not present

## 2018-11-25 DIAGNOSIS — I129 Hypertensive chronic kidney disease with stage 1 through stage 4 chronic kidney disease, or unspecified chronic kidney disease: Secondary | ICD-10-CM | POA: Diagnosis not present

## 2018-11-25 DIAGNOSIS — G8929 Other chronic pain: Secondary | ICD-10-CM | POA: Diagnosis not present

## 2018-11-25 DIAGNOSIS — K219 Gastro-esophageal reflux disease without esophagitis: Secondary | ICD-10-CM | POA: Diagnosis not present

## 2018-11-25 DIAGNOSIS — I2510 Atherosclerotic heart disease of native coronary artery without angina pectoris: Secondary | ICD-10-CM | POA: Diagnosis not present

## 2018-11-25 DIAGNOSIS — E1122 Type 2 diabetes mellitus with diabetic chronic kidney disease: Secondary | ICD-10-CM | POA: Diagnosis not present

## 2018-12-09 DIAGNOSIS — J449 Chronic obstructive pulmonary disease, unspecified: Secondary | ICD-10-CM | POA: Diagnosis not present

## 2018-12-09 DIAGNOSIS — Z7982 Long term (current) use of aspirin: Secondary | ICD-10-CM | POA: Diagnosis not present

## 2018-12-09 DIAGNOSIS — I2510 Atherosclerotic heart disease of native coronary artery without angina pectoris: Secondary | ICD-10-CM | POA: Diagnosis not present

## 2018-12-09 DIAGNOSIS — E1122 Type 2 diabetes mellitus with diabetic chronic kidney disease: Secondary | ICD-10-CM | POA: Diagnosis not present

## 2018-12-09 DIAGNOSIS — I129 Hypertensive chronic kidney disease with stage 1 through stage 4 chronic kidney disease, or unspecified chronic kidney disease: Secondary | ICD-10-CM | POA: Diagnosis not present

## 2018-12-09 DIAGNOSIS — G4733 Obstructive sleep apnea (adult) (pediatric): Secondary | ICD-10-CM | POA: Diagnosis not present

## 2018-12-09 DIAGNOSIS — M1991 Primary osteoarthritis, unspecified site: Secondary | ICD-10-CM | POA: Diagnosis not present

## 2018-12-09 DIAGNOSIS — G8929 Other chronic pain: Secondary | ICD-10-CM | POA: Diagnosis not present

## 2018-12-09 DIAGNOSIS — N189 Chronic kidney disease, unspecified: Secondary | ICD-10-CM | POA: Diagnosis not present

## 2018-12-09 DIAGNOSIS — K219 Gastro-esophageal reflux disease without esophagitis: Secondary | ICD-10-CM | POA: Diagnosis not present

## 2018-12-09 DIAGNOSIS — Z7984 Long term (current) use of oral hypoglycemic drugs: Secondary | ICD-10-CM | POA: Diagnosis not present

## 2018-12-23 DIAGNOSIS — I2510 Atherosclerotic heart disease of native coronary artery without angina pectoris: Secondary | ICD-10-CM | POA: Diagnosis not present

## 2018-12-23 DIAGNOSIS — G8929 Other chronic pain: Secondary | ICD-10-CM | POA: Diagnosis not present

## 2018-12-23 DIAGNOSIS — K219 Gastro-esophageal reflux disease without esophagitis: Secondary | ICD-10-CM | POA: Diagnosis not present

## 2018-12-23 DIAGNOSIS — I129 Hypertensive chronic kidney disease with stage 1 through stage 4 chronic kidney disease, or unspecified chronic kidney disease: Secondary | ICD-10-CM | POA: Diagnosis not present

## 2018-12-23 DIAGNOSIS — Z7982 Long term (current) use of aspirin: Secondary | ICD-10-CM | POA: Diagnosis not present

## 2018-12-23 DIAGNOSIS — G4733 Obstructive sleep apnea (adult) (pediatric): Secondary | ICD-10-CM | POA: Diagnosis not present

## 2018-12-23 DIAGNOSIS — Z7984 Long term (current) use of oral hypoglycemic drugs: Secondary | ICD-10-CM | POA: Diagnosis not present

## 2018-12-23 DIAGNOSIS — N189 Chronic kidney disease, unspecified: Secondary | ICD-10-CM | POA: Diagnosis not present

## 2018-12-23 DIAGNOSIS — E1122 Type 2 diabetes mellitus with diabetic chronic kidney disease: Secondary | ICD-10-CM | POA: Diagnosis not present

## 2018-12-23 DIAGNOSIS — M1991 Primary osteoarthritis, unspecified site: Secondary | ICD-10-CM | POA: Diagnosis not present

## 2018-12-23 DIAGNOSIS — J449 Chronic obstructive pulmonary disease, unspecified: Secondary | ICD-10-CM | POA: Diagnosis not present

## 2019-01-06 DIAGNOSIS — G8929 Other chronic pain: Secondary | ICD-10-CM | POA: Diagnosis not present

## 2019-01-06 DIAGNOSIS — Z7982 Long term (current) use of aspirin: Secondary | ICD-10-CM | POA: Diagnosis not present

## 2019-01-06 DIAGNOSIS — J449 Chronic obstructive pulmonary disease, unspecified: Secondary | ICD-10-CM | POA: Diagnosis not present

## 2019-01-06 DIAGNOSIS — I2510 Atherosclerotic heart disease of native coronary artery without angina pectoris: Secondary | ICD-10-CM | POA: Diagnosis not present

## 2019-01-06 DIAGNOSIS — M1991 Primary osteoarthritis, unspecified site: Secondary | ICD-10-CM | POA: Diagnosis not present

## 2019-01-06 DIAGNOSIS — E1122 Type 2 diabetes mellitus with diabetic chronic kidney disease: Secondary | ICD-10-CM | POA: Diagnosis not present

## 2019-01-06 DIAGNOSIS — N189 Chronic kidney disease, unspecified: Secondary | ICD-10-CM | POA: Diagnosis not present

## 2019-01-06 DIAGNOSIS — K219 Gastro-esophageal reflux disease without esophagitis: Secondary | ICD-10-CM | POA: Diagnosis not present

## 2019-01-06 DIAGNOSIS — Z7984 Long term (current) use of oral hypoglycemic drugs: Secondary | ICD-10-CM | POA: Diagnosis not present

## 2019-01-06 DIAGNOSIS — G4733 Obstructive sleep apnea (adult) (pediatric): Secondary | ICD-10-CM | POA: Diagnosis not present

## 2019-01-06 DIAGNOSIS — I129 Hypertensive chronic kidney disease with stage 1 through stage 4 chronic kidney disease, or unspecified chronic kidney disease: Secondary | ICD-10-CM | POA: Diagnosis not present

## 2019-01-20 DIAGNOSIS — G4733 Obstructive sleep apnea (adult) (pediatric): Secondary | ICD-10-CM | POA: Diagnosis not present

## 2019-01-20 DIAGNOSIS — Z7982 Long term (current) use of aspirin: Secondary | ICD-10-CM | POA: Diagnosis not present

## 2019-01-20 DIAGNOSIS — M1991 Primary osteoarthritis, unspecified site: Secondary | ICD-10-CM | POA: Diagnosis not present

## 2019-01-20 DIAGNOSIS — J449 Chronic obstructive pulmonary disease, unspecified: Secondary | ICD-10-CM | POA: Diagnosis not present

## 2019-01-20 DIAGNOSIS — E1122 Type 2 diabetes mellitus with diabetic chronic kidney disease: Secondary | ICD-10-CM | POA: Diagnosis not present

## 2019-01-20 DIAGNOSIS — K219 Gastro-esophageal reflux disease without esophagitis: Secondary | ICD-10-CM | POA: Diagnosis not present

## 2019-01-20 DIAGNOSIS — I129 Hypertensive chronic kidney disease with stage 1 through stage 4 chronic kidney disease, or unspecified chronic kidney disease: Secondary | ICD-10-CM | POA: Diagnosis not present

## 2019-01-20 DIAGNOSIS — I2510 Atherosclerotic heart disease of native coronary artery without angina pectoris: Secondary | ICD-10-CM | POA: Diagnosis not present

## 2019-01-20 DIAGNOSIS — Z7984 Long term (current) use of oral hypoglycemic drugs: Secondary | ICD-10-CM | POA: Diagnosis not present

## 2019-01-20 DIAGNOSIS — N189 Chronic kidney disease, unspecified: Secondary | ICD-10-CM | POA: Diagnosis not present

## 2019-01-20 DIAGNOSIS — G8929 Other chronic pain: Secondary | ICD-10-CM | POA: Diagnosis not present

## 2019-01-28 ENCOUNTER — Other Ambulatory Visit: Payer: Self-pay

## 2019-05-30 ENCOUNTER — Other Ambulatory Visit: Payer: Self-pay

## 2019-05-30 NOTE — Patient Outreach (Signed)
[facility]) Care Management  05/30/2019  Luke Allen [birth year] [id number]   Medication Adherence call to Luke Allen patients telephone number is disconnected,patient is past due on Metformin 1000 mg under [place].   [place] Management Direct Dial [phone]  Fax [phone] [email or web address]

## 2019-06-19 DIAGNOSIS — R1031 Right lower quadrant pain: Secondary | ICD-10-CM | POA: Diagnosis not present

## 2019-06-19 DIAGNOSIS — R079 Chest pain, unspecified: Secondary | ICD-10-CM | POA: Diagnosis not present

## 2019-06-19 DIAGNOSIS — R0602 Shortness of breath: Secondary | ICD-10-CM | POA: Diagnosis not present

## 2019-06-19 DIAGNOSIS — I714 Abdominal aortic aneurysm, without rupture: Secondary | ICD-10-CM | POA: Diagnosis not present

## 2019-06-19 DIAGNOSIS — R109 Unspecified abdominal pain: Secondary | ICD-10-CM | POA: Diagnosis not present

## 2019-06-19 DIAGNOSIS — J441 Chronic obstructive pulmonary disease with (acute) exacerbation: Secondary | ICD-10-CM | POA: Diagnosis not present

## 2019-06-19 DIAGNOSIS — J439 Emphysema, unspecified: Secondary | ICD-10-CM | POA: Diagnosis not present

## 2019-09-22 ENCOUNTER — Other Ambulatory Visit: Payer: Self-pay

## 2019-09-22 NOTE — Patient Outreach (Signed)
[facility]) Care Management  09/22/2019  [name] 12-Apr-1935 [id number]   Medication Adherence call to Mr. [name] patients telephone number is disconnected,patient is showing past due on Metformin 1000 mg under [place].   [place] Management Direct Dial [phone]  Fax [phone] [name]

## 2019-10-13 DIAGNOSIS — F17200 Nicotine dependence, unspecified, uncomplicated: Secondary | ICD-10-CM | POA: Diagnosis not present

## 2019-10-13 DIAGNOSIS — I10 Essential (primary) hypertension: Secondary | ICD-10-CM | POA: Diagnosis not present

## 2019-10-13 DIAGNOSIS — I5032 Chronic diastolic (congestive) heart failure: Secondary | ICD-10-CM | POA: Diagnosis not present

## 2019-10-13 DIAGNOSIS — E1165 Type 2 diabetes mellitus with hyperglycemia: Secondary | ICD-10-CM | POA: Diagnosis not present

## 2019-10-13 DIAGNOSIS — J449 Chronic obstructive pulmonary disease, unspecified: Secondary | ICD-10-CM | POA: Diagnosis not present

## 2019-11-24 DIAGNOSIS — R42 Dizziness and giddiness: Secondary | ICD-10-CM | POA: Diagnosis not present

## 2019-11-24 DIAGNOSIS — B356 Tinea cruris: Secondary | ICD-10-CM | POA: Diagnosis not present

## 2019-11-28 DIAGNOSIS — L570 Actinic keratosis: Secondary | ICD-10-CM | POA: Diagnosis not present

## 2019-11-28 DIAGNOSIS — C44529 Squamous cell carcinoma of skin of other part of trunk: Secondary | ICD-10-CM | POA: Diagnosis not present

## 2019-11-28 DIAGNOSIS — L720 Epidermal cyst: Secondary | ICD-10-CM | POA: Diagnosis not present

## 2019-11-28 DIAGNOSIS — L821 Other seborrheic keratosis: Secondary | ICD-10-CM | POA: Diagnosis not present

## 2019-11-28 DIAGNOSIS — L578 Other skin changes due to chronic exposure to nonionizing radiation: Secondary | ICD-10-CM | POA: Diagnosis not present

## 2019-11-28 DIAGNOSIS — L820 Inflamed seborrheic keratosis: Secondary | ICD-10-CM | POA: Diagnosis not present

## 2019-12-05 DIAGNOSIS — Z139 Encounter for screening, unspecified: Secondary | ICD-10-CM | POA: Diagnosis not present

## 2019-12-05 DIAGNOSIS — Z0000 Encounter for general adult medical examination without abnormal findings: Secondary | ICD-10-CM | POA: Diagnosis not present

## 2019-12-05 DIAGNOSIS — Z9181 History of falling: Secondary | ICD-10-CM | POA: Diagnosis not present

## 2019-12-05 DIAGNOSIS — E785 Hyperlipidemia, unspecified: Secondary | ICD-10-CM | POA: Diagnosis not present

## 2019-12-15 DIAGNOSIS — R42 Dizziness and giddiness: Secondary | ICD-10-CM | POA: Diagnosis not present

## 2020-03-23 DIAGNOSIS — N390 Urinary tract infection, site not specified: Secondary | ICD-10-CM | POA: Diagnosis not present

## 2020-03-23 DIAGNOSIS — R600 Localized edema: Secondary | ICD-10-CM | POA: Diagnosis not present

## 2020-03-28 DIAGNOSIS — N390 Urinary tract infection, site not specified: Secondary | ICD-10-CM | POA: Diagnosis not present

## 2020-05-15 DIAGNOSIS — R7303 Prediabetes: Secondary | ICD-10-CM | POA: Diagnosis not present

## 2020-05-15 DIAGNOSIS — I252 Old myocardial infarction: Secondary | ICD-10-CM | POA: Diagnosis not present

## 2020-05-15 DIAGNOSIS — F32A Depression, unspecified: Secondary | ICD-10-CM | POA: Diagnosis not present

## 2020-05-15 DIAGNOSIS — E876 Hypokalemia: Secondary | ICD-10-CM | POA: Diagnosis not present

## 2020-05-15 DIAGNOSIS — I2510 Atherosclerotic heart disease of native coronary artery without angina pectoris: Secondary | ICD-10-CM | POA: Diagnosis not present

## 2020-05-15 DIAGNOSIS — I10 Essential (primary) hypertension: Secondary | ICD-10-CM | POA: Diagnosis not present

## 2020-05-15 DIAGNOSIS — M109 Gout, unspecified: Secondary | ICD-10-CM | POA: Diagnosis not present

## 2020-05-15 DIAGNOSIS — J440 Chronic obstructive pulmonary disease with acute lower respiratory infection: Secondary | ICD-10-CM | POA: Diagnosis not present

## 2020-05-15 DIAGNOSIS — R0602 Shortness of breath: Secondary | ICD-10-CM | POA: Diagnosis not present

## 2020-05-15 DIAGNOSIS — K219 Gastro-esophageal reflux disease without esophagitis: Secondary | ICD-10-CM | POA: Diagnosis not present

## 2020-05-15 DIAGNOSIS — Z955 Presence of coronary angioplasty implant and graft: Secondary | ICD-10-CM | POA: Diagnosis not present

## 2020-05-15 DIAGNOSIS — R197 Diarrhea, unspecified: Secondary | ICD-10-CM | POA: Diagnosis not present

## 2020-05-15 DIAGNOSIS — Z79899 Other long term (current) drug therapy: Secondary | ICD-10-CM | POA: Diagnosis not present

## 2020-05-15 DIAGNOSIS — J441 Chronic obstructive pulmonary disease with (acute) exacerbation: Secondary | ICD-10-CM | POA: Diagnosis not present

## 2020-05-15 DIAGNOSIS — M1990 Unspecified osteoarthritis, unspecified site: Secondary | ICD-10-CM | POA: Diagnosis not present

## 2020-05-15 DIAGNOSIS — F17210 Nicotine dependence, cigarettes, uncomplicated: Secondary | ICD-10-CM | POA: Diagnosis not present

## 2020-05-15 DIAGNOSIS — R0902 Hypoxemia: Secondary | ICD-10-CM | POA: Diagnosis not present

## 2020-05-15 DIAGNOSIS — J449 Chronic obstructive pulmonary disease, unspecified: Secondary | ICD-10-CM | POA: Diagnosis not present

## 2020-05-15 DIAGNOSIS — Z743 Need for continuous supervision: Secondary | ICD-10-CM | POA: Diagnosis not present

## 2020-05-15 DIAGNOSIS — R531 Weakness: Secondary | ICD-10-CM | POA: Diagnosis not present

## 2020-05-15 DIAGNOSIS — J9601 Acute respiratory failure with hypoxia: Secondary | ICD-10-CM | POA: Diagnosis not present

## 2020-05-15 DIAGNOSIS — Z7984 Long term (current) use of oral hypoglycemic drugs: Secondary | ICD-10-CM | POA: Diagnosis not present

## 2020-05-15 DIAGNOSIS — Z8673 Personal history of transient ischemic attack (TIA), and cerebral infarction without residual deficits: Secondary | ICD-10-CM | POA: Diagnosis not present

## 2020-05-15 DIAGNOSIS — Z7982 Long term (current) use of aspirin: Secondary | ICD-10-CM | POA: Diagnosis not present

## 2020-05-15 DIAGNOSIS — A419 Sepsis, unspecified organism: Secondary | ICD-10-CM | POA: Diagnosis not present

## 2020-05-20 DIAGNOSIS — Z9181 History of falling: Secondary | ICD-10-CM | POA: Diagnosis not present

## 2020-05-20 DIAGNOSIS — M109 Gout, unspecified: Secondary | ICD-10-CM | POA: Diagnosis not present

## 2020-05-20 DIAGNOSIS — R7303 Prediabetes: Secondary | ICD-10-CM | POA: Diagnosis not present

## 2020-05-20 DIAGNOSIS — F32A Depression, unspecified: Secondary | ICD-10-CM | POA: Diagnosis not present

## 2020-05-20 DIAGNOSIS — Z95818 Presence of other cardiac implants and grafts: Secondary | ICD-10-CM | POA: Diagnosis not present

## 2020-05-20 DIAGNOSIS — I2510 Atherosclerotic heart disease of native coronary artery without angina pectoris: Secondary | ICD-10-CM | POA: Diagnosis not present

## 2020-05-20 DIAGNOSIS — Z7984 Long term (current) use of oral hypoglycemic drugs: Secondary | ICD-10-CM | POA: Diagnosis not present

## 2020-05-20 DIAGNOSIS — M1991 Primary osteoarthritis, unspecified site: Secondary | ICD-10-CM | POA: Diagnosis not present

## 2020-05-20 DIAGNOSIS — J441 Chronic obstructive pulmonary disease with (acute) exacerbation: Secondary | ICD-10-CM | POA: Diagnosis not present

## 2020-05-20 DIAGNOSIS — Z7982 Long term (current) use of aspirin: Secondary | ICD-10-CM | POA: Diagnosis not present

## 2020-05-20 DIAGNOSIS — K219 Gastro-esophageal reflux disease without esophagitis: Secondary | ICD-10-CM | POA: Diagnosis not present

## 2020-05-20 DIAGNOSIS — J9601 Acute respiratory failure with hypoxia: Secondary | ICD-10-CM | POA: Diagnosis not present

## 2020-05-20 DIAGNOSIS — Z7951 Long term (current) use of inhaled steroids: Secondary | ICD-10-CM | POA: Diagnosis not present

## 2020-05-20 DIAGNOSIS — Z9981 Dependence on supplemental oxygen: Secondary | ICD-10-CM | POA: Diagnosis not present

## 2020-05-20 DIAGNOSIS — I252 Old myocardial infarction: Secondary | ICD-10-CM | POA: Diagnosis not present

## 2020-05-20 DIAGNOSIS — I10 Essential (primary) hypertension: Secondary | ICD-10-CM | POA: Diagnosis not present

## 2020-05-20 DIAGNOSIS — F17210 Nicotine dependence, cigarettes, uncomplicated: Secondary | ICD-10-CM | POA: Diagnosis not present

## 2020-05-20 DIAGNOSIS — G43909 Migraine, unspecified, not intractable, without status migrainosus: Secondary | ICD-10-CM | POA: Diagnosis not present

## 2020-05-20 DIAGNOSIS — Z8673 Personal history of transient ischemic attack (TIA), and cerebral infarction without residual deficits: Secondary | ICD-10-CM | POA: Diagnosis not present

## 2020-05-24 DIAGNOSIS — Z9981 Dependence on supplemental oxygen: Secondary | ICD-10-CM | POA: Diagnosis not present

## 2020-05-24 DIAGNOSIS — F17200 Nicotine dependence, unspecified, uncomplicated: Secondary | ICD-10-CM | POA: Diagnosis not present

## 2020-05-24 DIAGNOSIS — Z95818 Presence of other cardiac implants and grafts: Secondary | ICD-10-CM | POA: Diagnosis not present

## 2020-05-24 DIAGNOSIS — M109 Gout, unspecified: Secondary | ICD-10-CM | POA: Diagnosis not present

## 2020-05-24 DIAGNOSIS — Z79899 Other long term (current) drug therapy: Secondary | ICD-10-CM | POA: Diagnosis not present

## 2020-05-24 DIAGNOSIS — F17210 Nicotine dependence, cigarettes, uncomplicated: Secondary | ICD-10-CM | POA: Diagnosis not present

## 2020-05-24 DIAGNOSIS — F32A Depression, unspecified: Secondary | ICD-10-CM | POA: Diagnosis not present

## 2020-05-24 DIAGNOSIS — J9601 Acute respiratory failure with hypoxia: Secondary | ICD-10-CM | POA: Diagnosis not present

## 2020-05-24 DIAGNOSIS — Z9181 History of falling: Secondary | ICD-10-CM | POA: Diagnosis not present

## 2020-05-24 DIAGNOSIS — K219 Gastro-esophageal reflux disease without esophagitis: Secondary | ICD-10-CM | POA: Diagnosis not present

## 2020-05-24 DIAGNOSIS — Z8673 Personal history of transient ischemic attack (TIA), and cerebral infarction without residual deficits: Secondary | ICD-10-CM | POA: Diagnosis not present

## 2020-05-24 DIAGNOSIS — Z7951 Long term (current) use of inhaled steroids: Secondary | ICD-10-CM | POA: Diagnosis not present

## 2020-05-24 DIAGNOSIS — I252 Old myocardial infarction: Secondary | ICD-10-CM | POA: Diagnosis not present

## 2020-05-24 DIAGNOSIS — M1991 Primary osteoarthritis, unspecified site: Secondary | ICD-10-CM | POA: Diagnosis not present

## 2020-05-24 DIAGNOSIS — Z7982 Long term (current) use of aspirin: Secondary | ICD-10-CM | POA: Diagnosis not present

## 2020-05-24 DIAGNOSIS — R7303 Prediabetes: Secondary | ICD-10-CM | POA: Diagnosis not present

## 2020-05-24 DIAGNOSIS — I2510 Atherosclerotic heart disease of native coronary artery without angina pectoris: Secondary | ICD-10-CM | POA: Diagnosis not present

## 2020-05-24 DIAGNOSIS — J441 Chronic obstructive pulmonary disease with (acute) exacerbation: Secondary | ICD-10-CM | POA: Diagnosis not present

## 2020-05-24 DIAGNOSIS — G43909 Migraine, unspecified, not intractable, without status migrainosus: Secondary | ICD-10-CM | POA: Diagnosis not present

## 2020-05-24 DIAGNOSIS — I10 Essential (primary) hypertension: Secondary | ICD-10-CM | POA: Diagnosis not present

## 2020-05-24 DIAGNOSIS — Z7984 Long term (current) use of oral hypoglycemic drugs: Secondary | ICD-10-CM | POA: Diagnosis not present

## 2020-05-28 DIAGNOSIS — G43909 Migraine, unspecified, not intractable, without status migrainosus: Secondary | ICD-10-CM | POA: Diagnosis not present

## 2020-05-28 DIAGNOSIS — Z7984 Long term (current) use of oral hypoglycemic drugs: Secondary | ICD-10-CM | POA: Diagnosis not present

## 2020-05-28 DIAGNOSIS — Z8673 Personal history of transient ischemic attack (TIA), and cerebral infarction without residual deficits: Secondary | ICD-10-CM | POA: Diagnosis not present

## 2020-05-28 DIAGNOSIS — I2510 Atherosclerotic heart disease of native coronary artery without angina pectoris: Secondary | ICD-10-CM | POA: Diagnosis not present

## 2020-05-28 DIAGNOSIS — M109 Gout, unspecified: Secondary | ICD-10-CM | POA: Diagnosis not present

## 2020-05-28 DIAGNOSIS — Z9981 Dependence on supplemental oxygen: Secondary | ICD-10-CM | POA: Diagnosis not present

## 2020-05-28 DIAGNOSIS — F32A Depression, unspecified: Secondary | ICD-10-CM | POA: Diagnosis not present

## 2020-05-28 DIAGNOSIS — Z7982 Long term (current) use of aspirin: Secondary | ICD-10-CM | POA: Diagnosis not present

## 2020-05-28 DIAGNOSIS — K219 Gastro-esophageal reflux disease without esophagitis: Secondary | ICD-10-CM | POA: Diagnosis not present

## 2020-05-28 DIAGNOSIS — J441 Chronic obstructive pulmonary disease with (acute) exacerbation: Secondary | ICD-10-CM | POA: Diagnosis not present

## 2020-05-28 DIAGNOSIS — M1991 Primary osteoarthritis, unspecified site: Secondary | ICD-10-CM | POA: Diagnosis not present

## 2020-05-28 DIAGNOSIS — F17210 Nicotine dependence, cigarettes, uncomplicated: Secondary | ICD-10-CM | POA: Diagnosis not present

## 2020-05-28 DIAGNOSIS — I10 Essential (primary) hypertension: Secondary | ICD-10-CM | POA: Diagnosis not present

## 2020-05-28 DIAGNOSIS — Z95818 Presence of other cardiac implants and grafts: Secondary | ICD-10-CM | POA: Diagnosis not present

## 2020-05-28 DIAGNOSIS — Z7951 Long term (current) use of inhaled steroids: Secondary | ICD-10-CM | POA: Diagnosis not present

## 2020-05-28 DIAGNOSIS — J9601 Acute respiratory failure with hypoxia: Secondary | ICD-10-CM | POA: Diagnosis not present

## 2020-05-28 DIAGNOSIS — R7303 Prediabetes: Secondary | ICD-10-CM | POA: Diagnosis not present

## 2020-05-28 DIAGNOSIS — I252 Old myocardial infarction: Secondary | ICD-10-CM | POA: Diagnosis not present

## 2020-05-28 DIAGNOSIS — Z9181 History of falling: Secondary | ICD-10-CM | POA: Diagnosis not present

## 2020-05-30 DIAGNOSIS — F32A Depression, unspecified: Secondary | ICD-10-CM | POA: Diagnosis not present

## 2020-05-30 DIAGNOSIS — Z9181 History of falling: Secondary | ICD-10-CM | POA: Diagnosis not present

## 2020-05-30 DIAGNOSIS — Z7951 Long term (current) use of inhaled steroids: Secondary | ICD-10-CM | POA: Diagnosis not present

## 2020-05-30 DIAGNOSIS — M109 Gout, unspecified: Secondary | ICD-10-CM | POA: Diagnosis not present

## 2020-05-30 DIAGNOSIS — Z8673 Personal history of transient ischemic attack (TIA), and cerebral infarction without residual deficits: Secondary | ICD-10-CM | POA: Diagnosis not present

## 2020-05-30 DIAGNOSIS — J441 Chronic obstructive pulmonary disease with (acute) exacerbation: Secondary | ICD-10-CM | POA: Diagnosis not present

## 2020-05-30 DIAGNOSIS — K219 Gastro-esophageal reflux disease without esophagitis: Secondary | ICD-10-CM | POA: Diagnosis not present

## 2020-05-30 DIAGNOSIS — I2510 Atherosclerotic heart disease of native coronary artery without angina pectoris: Secondary | ICD-10-CM | POA: Diagnosis not present

## 2020-05-30 DIAGNOSIS — Z9981 Dependence on supplemental oxygen: Secondary | ICD-10-CM | POA: Diagnosis not present

## 2020-05-30 DIAGNOSIS — F17210 Nicotine dependence, cigarettes, uncomplicated: Secondary | ICD-10-CM | POA: Diagnosis not present

## 2020-05-30 DIAGNOSIS — I252 Old myocardial infarction: Secondary | ICD-10-CM | POA: Diagnosis not present

## 2020-05-30 DIAGNOSIS — Z95818 Presence of other cardiac implants and grafts: Secondary | ICD-10-CM | POA: Diagnosis not present

## 2020-05-30 DIAGNOSIS — G43909 Migraine, unspecified, not intractable, without status migrainosus: Secondary | ICD-10-CM | POA: Diagnosis not present

## 2020-05-30 DIAGNOSIS — M1991 Primary osteoarthritis, unspecified site: Secondary | ICD-10-CM | POA: Diagnosis not present

## 2020-05-30 DIAGNOSIS — J9601 Acute respiratory failure with hypoxia: Secondary | ICD-10-CM | POA: Diagnosis not present

## 2020-05-30 DIAGNOSIS — Z7982 Long term (current) use of aspirin: Secondary | ICD-10-CM | POA: Diagnosis not present

## 2020-05-30 DIAGNOSIS — Z7984 Long term (current) use of oral hypoglycemic drugs: Secondary | ICD-10-CM | POA: Diagnosis not present

## 2020-05-30 DIAGNOSIS — R7303 Prediabetes: Secondary | ICD-10-CM | POA: Diagnosis not present

## 2020-05-30 DIAGNOSIS — I10 Essential (primary) hypertension: Secondary | ICD-10-CM | POA: Diagnosis not present

## 2020-06-05 DIAGNOSIS — F32A Depression, unspecified: Secondary | ICD-10-CM | POA: Diagnosis not present

## 2020-06-05 DIAGNOSIS — J9601 Acute respiratory failure with hypoxia: Secondary | ICD-10-CM | POA: Diagnosis not present

## 2020-06-05 DIAGNOSIS — I2510 Atherosclerotic heart disease of native coronary artery without angina pectoris: Secondary | ICD-10-CM | POA: Diagnosis not present

## 2020-06-05 DIAGNOSIS — K219 Gastro-esophageal reflux disease without esophagitis: Secondary | ICD-10-CM | POA: Diagnosis not present

## 2020-06-05 DIAGNOSIS — I252 Old myocardial infarction: Secondary | ICD-10-CM | POA: Diagnosis not present

## 2020-06-05 DIAGNOSIS — F17210 Nicotine dependence, cigarettes, uncomplicated: Secondary | ICD-10-CM | POA: Diagnosis not present

## 2020-06-05 DIAGNOSIS — Z9181 History of falling: Secondary | ICD-10-CM | POA: Diagnosis not present

## 2020-06-05 DIAGNOSIS — Z95818 Presence of other cardiac implants and grafts: Secondary | ICD-10-CM | POA: Diagnosis not present

## 2020-06-05 DIAGNOSIS — G43909 Migraine, unspecified, not intractable, without status migrainosus: Secondary | ICD-10-CM | POA: Diagnosis not present

## 2020-06-05 DIAGNOSIS — M109 Gout, unspecified: Secondary | ICD-10-CM | POA: Diagnosis not present

## 2020-06-05 DIAGNOSIS — Z7982 Long term (current) use of aspirin: Secondary | ICD-10-CM | POA: Diagnosis not present

## 2020-06-05 DIAGNOSIS — M1991 Primary osteoarthritis, unspecified site: Secondary | ICD-10-CM | POA: Diagnosis not present

## 2020-06-05 DIAGNOSIS — R7303 Prediabetes: Secondary | ICD-10-CM | POA: Diagnosis not present

## 2020-06-05 DIAGNOSIS — Z7951 Long term (current) use of inhaled steroids: Secondary | ICD-10-CM | POA: Diagnosis not present

## 2020-06-05 DIAGNOSIS — Z8673 Personal history of transient ischemic attack (TIA), and cerebral infarction without residual deficits: Secondary | ICD-10-CM | POA: Diagnosis not present

## 2020-06-05 DIAGNOSIS — Z7984 Long term (current) use of oral hypoglycemic drugs: Secondary | ICD-10-CM | POA: Diagnosis not present

## 2020-06-05 DIAGNOSIS — Z9981 Dependence on supplemental oxygen: Secondary | ICD-10-CM | POA: Diagnosis not present

## 2020-06-05 DIAGNOSIS — I10 Essential (primary) hypertension: Secondary | ICD-10-CM | POA: Diagnosis not present

## 2020-06-05 DIAGNOSIS — J441 Chronic obstructive pulmonary disease with (acute) exacerbation: Secondary | ICD-10-CM | POA: Diagnosis not present

## 2020-06-12 DIAGNOSIS — Z95818 Presence of other cardiac implants and grafts: Secondary | ICD-10-CM | POA: Diagnosis not present

## 2020-06-12 DIAGNOSIS — Z8673 Personal history of transient ischemic attack (TIA), and cerebral infarction without residual deficits: Secondary | ICD-10-CM | POA: Diagnosis not present

## 2020-06-12 DIAGNOSIS — Z7951 Long term (current) use of inhaled steroids: Secondary | ICD-10-CM | POA: Diagnosis not present

## 2020-06-12 DIAGNOSIS — M109 Gout, unspecified: Secondary | ICD-10-CM | POA: Diagnosis not present

## 2020-06-12 DIAGNOSIS — I10 Essential (primary) hypertension: Secondary | ICD-10-CM | POA: Diagnosis not present

## 2020-06-12 DIAGNOSIS — Z7984 Long term (current) use of oral hypoglycemic drugs: Secondary | ICD-10-CM | POA: Diagnosis not present

## 2020-06-12 DIAGNOSIS — J441 Chronic obstructive pulmonary disease with (acute) exacerbation: Secondary | ICD-10-CM | POA: Diagnosis not present

## 2020-06-12 DIAGNOSIS — I252 Old myocardial infarction: Secondary | ICD-10-CM | POA: Diagnosis not present

## 2020-06-12 DIAGNOSIS — F32A Depression, unspecified: Secondary | ICD-10-CM | POA: Diagnosis not present

## 2020-06-12 DIAGNOSIS — F17210 Nicotine dependence, cigarettes, uncomplicated: Secondary | ICD-10-CM | POA: Diagnosis not present

## 2020-06-12 DIAGNOSIS — Z9181 History of falling: Secondary | ICD-10-CM | POA: Diagnosis not present

## 2020-06-12 DIAGNOSIS — Z7982 Long term (current) use of aspirin: Secondary | ICD-10-CM | POA: Diagnosis not present

## 2020-06-12 DIAGNOSIS — R7303 Prediabetes: Secondary | ICD-10-CM | POA: Diagnosis not present

## 2020-06-12 DIAGNOSIS — K219 Gastro-esophageal reflux disease without esophagitis: Secondary | ICD-10-CM | POA: Diagnosis not present

## 2020-06-12 DIAGNOSIS — J9601 Acute respiratory failure with hypoxia: Secondary | ICD-10-CM | POA: Diagnosis not present

## 2020-06-12 DIAGNOSIS — G43909 Migraine, unspecified, not intractable, without status migrainosus: Secondary | ICD-10-CM | POA: Diagnosis not present

## 2020-06-12 DIAGNOSIS — Z9981 Dependence on supplemental oxygen: Secondary | ICD-10-CM | POA: Diagnosis not present

## 2020-06-12 DIAGNOSIS — I2510 Atherosclerotic heart disease of native coronary artery without angina pectoris: Secondary | ICD-10-CM | POA: Diagnosis not present

## 2020-06-12 DIAGNOSIS — M1991 Primary osteoarthritis, unspecified site: Secondary | ICD-10-CM | POA: Diagnosis not present

## 2020-06-15 DIAGNOSIS — Z7951 Long term (current) use of inhaled steroids: Secondary | ICD-10-CM | POA: Diagnosis not present

## 2020-06-15 DIAGNOSIS — M109 Gout, unspecified: Secondary | ICD-10-CM | POA: Diagnosis not present

## 2020-06-15 DIAGNOSIS — Z7984 Long term (current) use of oral hypoglycemic drugs: Secondary | ICD-10-CM | POA: Diagnosis not present

## 2020-06-15 DIAGNOSIS — I252 Old myocardial infarction: Secondary | ICD-10-CM | POA: Diagnosis not present

## 2020-06-15 DIAGNOSIS — R7303 Prediabetes: Secondary | ICD-10-CM | POA: Diagnosis not present

## 2020-06-15 DIAGNOSIS — Z8673 Personal history of transient ischemic attack (TIA), and cerebral infarction without residual deficits: Secondary | ICD-10-CM | POA: Diagnosis not present

## 2020-06-15 DIAGNOSIS — Z95818 Presence of other cardiac implants and grafts: Secondary | ICD-10-CM | POA: Diagnosis not present

## 2020-06-15 DIAGNOSIS — M1991 Primary osteoarthritis, unspecified site: Secondary | ICD-10-CM | POA: Diagnosis not present

## 2020-06-15 DIAGNOSIS — G43909 Migraine, unspecified, not intractable, without status migrainosus: Secondary | ICD-10-CM | POA: Diagnosis not present

## 2020-06-15 DIAGNOSIS — Z9981 Dependence on supplemental oxygen: Secondary | ICD-10-CM | POA: Diagnosis not present

## 2020-06-15 DIAGNOSIS — I10 Essential (primary) hypertension: Secondary | ICD-10-CM | POA: Diagnosis not present

## 2020-06-15 DIAGNOSIS — K219 Gastro-esophageal reflux disease without esophagitis: Secondary | ICD-10-CM | POA: Diagnosis not present

## 2020-06-15 DIAGNOSIS — F17210 Nicotine dependence, cigarettes, uncomplicated: Secondary | ICD-10-CM | POA: Diagnosis not present

## 2020-06-15 DIAGNOSIS — I2510 Atherosclerotic heart disease of native coronary artery without angina pectoris: Secondary | ICD-10-CM | POA: Diagnosis not present

## 2020-06-15 DIAGNOSIS — F32A Depression, unspecified: Secondary | ICD-10-CM | POA: Diagnosis not present

## 2020-06-15 DIAGNOSIS — Z9181 History of falling: Secondary | ICD-10-CM | POA: Diagnosis not present

## 2020-06-15 DIAGNOSIS — J9601 Acute respiratory failure with hypoxia: Secondary | ICD-10-CM | POA: Diagnosis not present

## 2020-06-15 DIAGNOSIS — Z7982 Long term (current) use of aspirin: Secondary | ICD-10-CM | POA: Diagnosis not present

## 2020-06-15 DIAGNOSIS — J441 Chronic obstructive pulmonary disease with (acute) exacerbation: Secondary | ICD-10-CM | POA: Diagnosis not present

## 2020-06-16 DIAGNOSIS — J441 Chronic obstructive pulmonary disease with (acute) exacerbation: Secondary | ICD-10-CM | POA: Diagnosis not present

## 2020-06-16 DIAGNOSIS — J9601 Acute respiratory failure with hypoxia: Secondary | ICD-10-CM | POA: Diagnosis not present

## 2020-06-19 DIAGNOSIS — M1991 Primary osteoarthritis, unspecified site: Secondary | ICD-10-CM | POA: Diagnosis not present

## 2020-06-19 DIAGNOSIS — F17210 Nicotine dependence, cigarettes, uncomplicated: Secondary | ICD-10-CM | POA: Diagnosis not present

## 2020-06-19 DIAGNOSIS — J9601 Acute respiratory failure with hypoxia: Secondary | ICD-10-CM | POA: Diagnosis not present

## 2020-06-19 DIAGNOSIS — I10 Essential (primary) hypertension: Secondary | ICD-10-CM | POA: Diagnosis not present

## 2020-06-19 DIAGNOSIS — J441 Chronic obstructive pulmonary disease with (acute) exacerbation: Secondary | ICD-10-CM | POA: Diagnosis not present

## 2020-06-19 DIAGNOSIS — Z9181 History of falling: Secondary | ICD-10-CM | POA: Diagnosis not present

## 2020-06-19 DIAGNOSIS — R7303 Prediabetes: Secondary | ICD-10-CM | POA: Diagnosis not present

## 2020-06-19 DIAGNOSIS — I252 Old myocardial infarction: Secondary | ICD-10-CM | POA: Diagnosis not present

## 2020-06-19 DIAGNOSIS — Z7951 Long term (current) use of inhaled steroids: Secondary | ICD-10-CM | POA: Diagnosis not present

## 2020-06-19 DIAGNOSIS — F32A Depression, unspecified: Secondary | ICD-10-CM | POA: Diagnosis not present

## 2020-06-19 DIAGNOSIS — G43909 Migraine, unspecified, not intractable, without status migrainosus: Secondary | ICD-10-CM | POA: Diagnosis not present

## 2020-06-19 DIAGNOSIS — Z95818 Presence of other cardiac implants and grafts: Secondary | ICD-10-CM | POA: Diagnosis not present

## 2020-06-19 DIAGNOSIS — Z9981 Dependence on supplemental oxygen: Secondary | ICD-10-CM | POA: Diagnosis not present

## 2020-06-19 DIAGNOSIS — M109 Gout, unspecified: Secondary | ICD-10-CM | POA: Diagnosis not present

## 2020-06-19 DIAGNOSIS — K219 Gastro-esophageal reflux disease without esophagitis: Secondary | ICD-10-CM | POA: Diagnosis not present

## 2020-06-19 DIAGNOSIS — Z8673 Personal history of transient ischemic attack (TIA), and cerebral infarction without residual deficits: Secondary | ICD-10-CM | POA: Diagnosis not present

## 2020-06-19 DIAGNOSIS — Z7982 Long term (current) use of aspirin: Secondary | ICD-10-CM | POA: Diagnosis not present

## 2020-06-19 DIAGNOSIS — I2510 Atherosclerotic heart disease of native coronary artery without angina pectoris: Secondary | ICD-10-CM | POA: Diagnosis not present

## 2020-06-19 DIAGNOSIS — Z7984 Long term (current) use of oral hypoglycemic drugs: Secondary | ICD-10-CM | POA: Diagnosis not present

## 2020-06-21 DIAGNOSIS — I2510 Atherosclerotic heart disease of native coronary artery without angina pectoris: Secondary | ICD-10-CM | POA: Diagnosis not present

## 2020-06-21 DIAGNOSIS — J9601 Acute respiratory failure with hypoxia: Secondary | ICD-10-CM | POA: Diagnosis not present

## 2020-06-21 DIAGNOSIS — Z7951 Long term (current) use of inhaled steroids: Secondary | ICD-10-CM | POA: Diagnosis not present

## 2020-06-21 DIAGNOSIS — Z9981 Dependence on supplemental oxygen: Secondary | ICD-10-CM | POA: Diagnosis not present

## 2020-06-21 DIAGNOSIS — Z7982 Long term (current) use of aspirin: Secondary | ICD-10-CM | POA: Diagnosis not present

## 2020-06-21 DIAGNOSIS — M1991 Primary osteoarthritis, unspecified site: Secondary | ICD-10-CM | POA: Diagnosis not present

## 2020-06-21 DIAGNOSIS — I10 Essential (primary) hypertension: Secondary | ICD-10-CM | POA: Diagnosis not present

## 2020-06-21 DIAGNOSIS — R7303 Prediabetes: Secondary | ICD-10-CM | POA: Diagnosis not present

## 2020-06-21 DIAGNOSIS — Z95818 Presence of other cardiac implants and grafts: Secondary | ICD-10-CM | POA: Diagnosis not present

## 2020-06-21 DIAGNOSIS — Z8673 Personal history of transient ischemic attack (TIA), and cerebral infarction without residual deficits: Secondary | ICD-10-CM | POA: Diagnosis not present

## 2020-06-21 DIAGNOSIS — M109 Gout, unspecified: Secondary | ICD-10-CM | POA: Diagnosis not present

## 2020-06-21 DIAGNOSIS — G43909 Migraine, unspecified, not intractable, without status migrainosus: Secondary | ICD-10-CM | POA: Diagnosis not present

## 2020-06-21 DIAGNOSIS — Z7984 Long term (current) use of oral hypoglycemic drugs: Secondary | ICD-10-CM | POA: Diagnosis not present

## 2020-06-21 DIAGNOSIS — K219 Gastro-esophageal reflux disease without esophagitis: Secondary | ICD-10-CM | POA: Diagnosis not present

## 2020-06-21 DIAGNOSIS — I252 Old myocardial infarction: Secondary | ICD-10-CM | POA: Diagnosis not present

## 2020-06-21 DIAGNOSIS — J441 Chronic obstructive pulmonary disease with (acute) exacerbation: Secondary | ICD-10-CM | POA: Diagnosis not present

## 2020-06-21 DIAGNOSIS — Z9181 History of falling: Secondary | ICD-10-CM | POA: Diagnosis not present

## 2020-06-21 DIAGNOSIS — F17210 Nicotine dependence, cigarettes, uncomplicated: Secondary | ICD-10-CM | POA: Diagnosis not present

## 2020-06-21 DIAGNOSIS — F32A Depression, unspecified: Secondary | ICD-10-CM | POA: Diagnosis not present

## 2020-06-26 DIAGNOSIS — J441 Chronic obstructive pulmonary disease with (acute) exacerbation: Secondary | ICD-10-CM | POA: Diagnosis not present

## 2020-06-26 DIAGNOSIS — J9601 Acute respiratory failure with hypoxia: Secondary | ICD-10-CM | POA: Diagnosis not present

## 2020-06-26 DIAGNOSIS — Z7951 Long term (current) use of inhaled steroids: Secondary | ICD-10-CM | POA: Diagnosis not present

## 2020-06-26 DIAGNOSIS — Z8673 Personal history of transient ischemic attack (TIA), and cerebral infarction without residual deficits: Secondary | ICD-10-CM | POA: Diagnosis not present

## 2020-06-26 DIAGNOSIS — Z95818 Presence of other cardiac implants and grafts: Secondary | ICD-10-CM | POA: Diagnosis not present

## 2020-06-26 DIAGNOSIS — F32A Depression, unspecified: Secondary | ICD-10-CM | POA: Diagnosis not present

## 2020-06-26 DIAGNOSIS — Z9981 Dependence on supplemental oxygen: Secondary | ICD-10-CM | POA: Diagnosis not present

## 2020-06-26 DIAGNOSIS — M109 Gout, unspecified: Secondary | ICD-10-CM | POA: Diagnosis not present

## 2020-06-26 DIAGNOSIS — K219 Gastro-esophageal reflux disease without esophagitis: Secondary | ICD-10-CM | POA: Diagnosis not present

## 2020-06-26 DIAGNOSIS — Z9181 History of falling: Secondary | ICD-10-CM | POA: Diagnosis not present

## 2020-06-26 DIAGNOSIS — Z7984 Long term (current) use of oral hypoglycemic drugs: Secondary | ICD-10-CM | POA: Diagnosis not present

## 2020-06-26 DIAGNOSIS — I252 Old myocardial infarction: Secondary | ICD-10-CM | POA: Diagnosis not present

## 2020-06-26 DIAGNOSIS — I2510 Atherosclerotic heart disease of native coronary artery without angina pectoris: Secondary | ICD-10-CM | POA: Diagnosis not present

## 2020-06-26 DIAGNOSIS — R7303 Prediabetes: Secondary | ICD-10-CM | POA: Diagnosis not present

## 2020-06-26 DIAGNOSIS — G43909 Migraine, unspecified, not intractable, without status migrainosus: Secondary | ICD-10-CM | POA: Diagnosis not present

## 2020-06-26 DIAGNOSIS — F17210 Nicotine dependence, cigarettes, uncomplicated: Secondary | ICD-10-CM | POA: Diagnosis not present

## 2020-06-26 DIAGNOSIS — I10 Essential (primary) hypertension: Secondary | ICD-10-CM | POA: Diagnosis not present

## 2020-06-26 DIAGNOSIS — Z7982 Long term (current) use of aspirin: Secondary | ICD-10-CM | POA: Diagnosis not present

## 2020-06-26 DIAGNOSIS — M1991 Primary osteoarthritis, unspecified site: Secondary | ICD-10-CM | POA: Diagnosis not present

## 2020-06-29 DIAGNOSIS — J441 Chronic obstructive pulmonary disease with (acute) exacerbation: Secondary | ICD-10-CM | POA: Diagnosis not present

## 2020-06-29 DIAGNOSIS — I252 Old myocardial infarction: Secondary | ICD-10-CM | POA: Diagnosis not present

## 2020-06-29 DIAGNOSIS — F17210 Nicotine dependence, cigarettes, uncomplicated: Secondary | ICD-10-CM | POA: Diagnosis not present

## 2020-06-29 DIAGNOSIS — K219 Gastro-esophageal reflux disease without esophagitis: Secondary | ICD-10-CM | POA: Diagnosis not present

## 2020-06-29 DIAGNOSIS — Z7984 Long term (current) use of oral hypoglycemic drugs: Secondary | ICD-10-CM | POA: Diagnosis not present

## 2020-06-29 DIAGNOSIS — I10 Essential (primary) hypertension: Secondary | ICD-10-CM | POA: Diagnosis not present

## 2020-06-29 DIAGNOSIS — R7303 Prediabetes: Secondary | ICD-10-CM | POA: Diagnosis not present

## 2020-06-29 DIAGNOSIS — Z7951 Long term (current) use of inhaled steroids: Secondary | ICD-10-CM | POA: Diagnosis not present

## 2020-06-29 DIAGNOSIS — I2510 Atherosclerotic heart disease of native coronary artery without angina pectoris: Secondary | ICD-10-CM | POA: Diagnosis not present

## 2020-06-29 DIAGNOSIS — F32A Depression, unspecified: Secondary | ICD-10-CM | POA: Diagnosis not present

## 2020-06-29 DIAGNOSIS — Z7982 Long term (current) use of aspirin: Secondary | ICD-10-CM | POA: Diagnosis not present

## 2020-06-29 DIAGNOSIS — Z9181 History of falling: Secondary | ICD-10-CM | POA: Diagnosis not present

## 2020-06-29 DIAGNOSIS — Z8673 Personal history of transient ischemic attack (TIA), and cerebral infarction without residual deficits: Secondary | ICD-10-CM | POA: Diagnosis not present

## 2020-06-29 DIAGNOSIS — Z95818 Presence of other cardiac implants and grafts: Secondary | ICD-10-CM | POA: Diagnosis not present

## 2020-06-29 DIAGNOSIS — M1991 Primary osteoarthritis, unspecified site: Secondary | ICD-10-CM | POA: Diagnosis not present

## 2020-06-29 DIAGNOSIS — M109 Gout, unspecified: Secondary | ICD-10-CM | POA: Diagnosis not present

## 2020-06-29 DIAGNOSIS — Z9981 Dependence on supplemental oxygen: Secondary | ICD-10-CM | POA: Diagnosis not present

## 2020-06-29 DIAGNOSIS — J9601 Acute respiratory failure with hypoxia: Secondary | ICD-10-CM | POA: Diagnosis not present

## 2020-06-29 DIAGNOSIS — G43909 Migraine, unspecified, not intractable, without status migrainosus: Secondary | ICD-10-CM | POA: Diagnosis not present

## 2020-07-03 DIAGNOSIS — Z7982 Long term (current) use of aspirin: Secondary | ICD-10-CM | POA: Diagnosis not present

## 2020-07-03 DIAGNOSIS — I252 Old myocardial infarction: Secondary | ICD-10-CM | POA: Diagnosis not present

## 2020-07-03 DIAGNOSIS — Z7984 Long term (current) use of oral hypoglycemic drugs: Secondary | ICD-10-CM | POA: Diagnosis not present

## 2020-07-03 DIAGNOSIS — M109 Gout, unspecified: Secondary | ICD-10-CM | POA: Diagnosis not present

## 2020-07-03 DIAGNOSIS — F32A Depression, unspecified: Secondary | ICD-10-CM | POA: Diagnosis not present

## 2020-07-03 DIAGNOSIS — I2510 Atherosclerotic heart disease of native coronary artery without angina pectoris: Secondary | ICD-10-CM | POA: Diagnosis not present

## 2020-07-03 DIAGNOSIS — J9601 Acute respiratory failure with hypoxia: Secondary | ICD-10-CM | POA: Diagnosis not present

## 2020-07-03 DIAGNOSIS — Z95818 Presence of other cardiac implants and grafts: Secondary | ICD-10-CM | POA: Diagnosis not present

## 2020-07-03 DIAGNOSIS — Z9981 Dependence on supplemental oxygen: Secondary | ICD-10-CM | POA: Diagnosis not present

## 2020-07-03 DIAGNOSIS — G43909 Migraine, unspecified, not intractable, without status migrainosus: Secondary | ICD-10-CM | POA: Diagnosis not present

## 2020-07-03 DIAGNOSIS — J441 Chronic obstructive pulmonary disease with (acute) exacerbation: Secondary | ICD-10-CM | POA: Diagnosis not present

## 2020-07-03 DIAGNOSIS — Z7951 Long term (current) use of inhaled steroids: Secondary | ICD-10-CM | POA: Diagnosis not present

## 2020-07-03 DIAGNOSIS — I10 Essential (primary) hypertension: Secondary | ICD-10-CM | POA: Diagnosis not present

## 2020-07-03 DIAGNOSIS — Z8673 Personal history of transient ischemic attack (TIA), and cerebral infarction without residual deficits: Secondary | ICD-10-CM | POA: Diagnosis not present

## 2020-07-03 DIAGNOSIS — Z9181 History of falling: Secondary | ICD-10-CM | POA: Diagnosis not present

## 2020-07-03 DIAGNOSIS — R7303 Prediabetes: Secondary | ICD-10-CM | POA: Diagnosis not present

## 2020-07-03 DIAGNOSIS — M1991 Primary osteoarthritis, unspecified site: Secondary | ICD-10-CM | POA: Diagnosis not present

## 2020-07-03 DIAGNOSIS — K219 Gastro-esophageal reflux disease without esophagitis: Secondary | ICD-10-CM | POA: Diagnosis not present

## 2020-07-03 DIAGNOSIS — F17210 Nicotine dependence, cigarettes, uncomplicated: Secondary | ICD-10-CM | POA: Diagnosis not present

## 2020-07-10 DIAGNOSIS — Z95818 Presence of other cardiac implants and grafts: Secondary | ICD-10-CM | POA: Diagnosis not present

## 2020-07-10 DIAGNOSIS — Z9981 Dependence on supplemental oxygen: Secondary | ICD-10-CM | POA: Diagnosis not present

## 2020-07-10 DIAGNOSIS — I252 Old myocardial infarction: Secondary | ICD-10-CM | POA: Diagnosis not present

## 2020-07-10 DIAGNOSIS — J9601 Acute respiratory failure with hypoxia: Secondary | ICD-10-CM | POA: Diagnosis not present

## 2020-07-10 DIAGNOSIS — G43909 Migraine, unspecified, not intractable, without status migrainosus: Secondary | ICD-10-CM | POA: Diagnosis not present

## 2020-07-10 DIAGNOSIS — M1991 Primary osteoarthritis, unspecified site: Secondary | ICD-10-CM | POA: Diagnosis not present

## 2020-07-10 DIAGNOSIS — M109 Gout, unspecified: Secondary | ICD-10-CM | POA: Diagnosis not present

## 2020-07-10 DIAGNOSIS — I10 Essential (primary) hypertension: Secondary | ICD-10-CM | POA: Diagnosis not present

## 2020-07-10 DIAGNOSIS — F17210 Nicotine dependence, cigarettes, uncomplicated: Secondary | ICD-10-CM | POA: Diagnosis not present

## 2020-07-10 DIAGNOSIS — Z9181 History of falling: Secondary | ICD-10-CM | POA: Diagnosis not present

## 2020-07-10 DIAGNOSIS — Z7951 Long term (current) use of inhaled steroids: Secondary | ICD-10-CM | POA: Diagnosis not present

## 2020-07-10 DIAGNOSIS — Z7982 Long term (current) use of aspirin: Secondary | ICD-10-CM | POA: Diagnosis not present

## 2020-07-10 DIAGNOSIS — R7303 Prediabetes: Secondary | ICD-10-CM | POA: Diagnosis not present

## 2020-07-10 DIAGNOSIS — K219 Gastro-esophageal reflux disease without esophagitis: Secondary | ICD-10-CM | POA: Diagnosis not present

## 2020-07-10 DIAGNOSIS — I2510 Atherosclerotic heart disease of native coronary artery without angina pectoris: Secondary | ICD-10-CM | POA: Diagnosis not present

## 2020-07-10 DIAGNOSIS — Z8673 Personal history of transient ischemic attack (TIA), and cerebral infarction without residual deficits: Secondary | ICD-10-CM | POA: Diagnosis not present

## 2020-07-10 DIAGNOSIS — J441 Chronic obstructive pulmonary disease with (acute) exacerbation: Secondary | ICD-10-CM | POA: Diagnosis not present

## 2020-07-10 DIAGNOSIS — F32A Depression, unspecified: Secondary | ICD-10-CM | POA: Diagnosis not present

## 2020-07-10 DIAGNOSIS — Z7984 Long term (current) use of oral hypoglycemic drugs: Secondary | ICD-10-CM | POA: Diagnosis not present

## 2020-07-12 DIAGNOSIS — Z9181 History of falling: Secondary | ICD-10-CM | POA: Diagnosis not present

## 2020-07-12 DIAGNOSIS — I252 Old myocardial infarction: Secondary | ICD-10-CM | POA: Diagnosis not present

## 2020-07-12 DIAGNOSIS — K219 Gastro-esophageal reflux disease without esophagitis: Secondary | ICD-10-CM | POA: Diagnosis not present

## 2020-07-12 DIAGNOSIS — Z7982 Long term (current) use of aspirin: Secondary | ICD-10-CM | POA: Diagnosis not present

## 2020-07-12 DIAGNOSIS — J9601 Acute respiratory failure with hypoxia: Secondary | ICD-10-CM | POA: Diagnosis not present

## 2020-07-12 DIAGNOSIS — Z9981 Dependence on supplemental oxygen: Secondary | ICD-10-CM | POA: Diagnosis not present

## 2020-07-12 DIAGNOSIS — Z8673 Personal history of transient ischemic attack (TIA), and cerebral infarction without residual deficits: Secondary | ICD-10-CM | POA: Diagnosis not present

## 2020-07-12 DIAGNOSIS — M1991 Primary osteoarthritis, unspecified site: Secondary | ICD-10-CM | POA: Diagnosis not present

## 2020-07-12 DIAGNOSIS — R7303 Prediabetes: Secondary | ICD-10-CM | POA: Diagnosis not present

## 2020-07-12 DIAGNOSIS — F17210 Nicotine dependence, cigarettes, uncomplicated: Secondary | ICD-10-CM | POA: Diagnosis not present

## 2020-07-12 DIAGNOSIS — Z95818 Presence of other cardiac implants and grafts: Secondary | ICD-10-CM | POA: Diagnosis not present

## 2020-07-12 DIAGNOSIS — Z7984 Long term (current) use of oral hypoglycemic drugs: Secondary | ICD-10-CM | POA: Diagnosis not present

## 2020-07-12 DIAGNOSIS — Z7951 Long term (current) use of inhaled steroids: Secondary | ICD-10-CM | POA: Diagnosis not present

## 2020-07-12 DIAGNOSIS — F32A Depression, unspecified: Secondary | ICD-10-CM | POA: Diagnosis not present

## 2020-07-12 DIAGNOSIS — M109 Gout, unspecified: Secondary | ICD-10-CM | POA: Diagnosis not present

## 2020-07-12 DIAGNOSIS — I2510 Atherosclerotic heart disease of native coronary artery without angina pectoris: Secondary | ICD-10-CM | POA: Diagnosis not present

## 2020-07-12 DIAGNOSIS — I10 Essential (primary) hypertension: Secondary | ICD-10-CM | POA: Diagnosis not present

## 2020-07-12 DIAGNOSIS — J441 Chronic obstructive pulmonary disease with (acute) exacerbation: Secondary | ICD-10-CM | POA: Diagnosis not present

## 2020-07-12 DIAGNOSIS — G43909 Migraine, unspecified, not intractable, without status migrainosus: Secondary | ICD-10-CM | POA: Diagnosis not present

## 2020-07-13 DIAGNOSIS — Z7984 Long term (current) use of oral hypoglycemic drugs: Secondary | ICD-10-CM | POA: Diagnosis not present

## 2020-07-13 DIAGNOSIS — Z7951 Long term (current) use of inhaled steroids: Secondary | ICD-10-CM | POA: Diagnosis not present

## 2020-07-13 DIAGNOSIS — F17210 Nicotine dependence, cigarettes, uncomplicated: Secondary | ICD-10-CM | POA: Diagnosis not present

## 2020-07-13 DIAGNOSIS — J441 Chronic obstructive pulmonary disease with (acute) exacerbation: Secondary | ICD-10-CM | POA: Diagnosis not present

## 2020-07-13 DIAGNOSIS — M1991 Primary osteoarthritis, unspecified site: Secondary | ICD-10-CM | POA: Diagnosis not present

## 2020-07-13 DIAGNOSIS — G43909 Migraine, unspecified, not intractable, without status migrainosus: Secondary | ICD-10-CM | POA: Diagnosis not present

## 2020-07-13 DIAGNOSIS — I10 Essential (primary) hypertension: Secondary | ICD-10-CM | POA: Diagnosis not present

## 2020-07-13 DIAGNOSIS — K219 Gastro-esophageal reflux disease without esophagitis: Secondary | ICD-10-CM | POA: Diagnosis not present

## 2020-07-13 DIAGNOSIS — Z95818 Presence of other cardiac implants and grafts: Secondary | ICD-10-CM | POA: Diagnosis not present

## 2020-07-13 DIAGNOSIS — Z9181 History of falling: Secondary | ICD-10-CM | POA: Diagnosis not present

## 2020-07-13 DIAGNOSIS — J9601 Acute respiratory failure with hypoxia: Secondary | ICD-10-CM | POA: Diagnosis not present

## 2020-07-13 DIAGNOSIS — F32A Depression, unspecified: Secondary | ICD-10-CM | POA: Diagnosis not present

## 2020-07-13 DIAGNOSIS — R7303 Prediabetes: Secondary | ICD-10-CM | POA: Diagnosis not present

## 2020-07-13 DIAGNOSIS — I252 Old myocardial infarction: Secondary | ICD-10-CM | POA: Diagnosis not present

## 2020-07-13 DIAGNOSIS — I2510 Atherosclerotic heart disease of native coronary artery without angina pectoris: Secondary | ICD-10-CM | POA: Diagnosis not present

## 2020-07-13 DIAGNOSIS — M109 Gout, unspecified: Secondary | ICD-10-CM | POA: Diagnosis not present

## 2020-07-13 DIAGNOSIS — Z7982 Long term (current) use of aspirin: Secondary | ICD-10-CM | POA: Diagnosis not present

## 2020-07-13 DIAGNOSIS — Z9981 Dependence on supplemental oxygen: Secondary | ICD-10-CM | POA: Diagnosis not present

## 2020-07-13 DIAGNOSIS — Z8673 Personal history of transient ischemic attack (TIA), and cerebral infarction without residual deficits: Secondary | ICD-10-CM | POA: Diagnosis not present

## 2020-08-14 DIAGNOSIS — H9193 Unspecified hearing loss, bilateral: Secondary | ICD-10-CM | POA: Diagnosis not present

## 2020-08-14 DIAGNOSIS — I2510 Atherosclerotic heart disease of native coronary artery without angina pectoris: Secondary | ICD-10-CM | POA: Diagnosis not present

## 2020-08-14 DIAGNOSIS — F32A Depression, unspecified: Secondary | ICD-10-CM | POA: Diagnosis not present

## 2020-08-14 DIAGNOSIS — F17210 Nicotine dependence, cigarettes, uncomplicated: Secondary | ICD-10-CM | POA: Diagnosis not present

## 2020-08-14 DIAGNOSIS — G47411 Narcolepsy with cataplexy: Secondary | ICD-10-CM | POA: Diagnosis not present

## 2020-08-14 DIAGNOSIS — I5032 Chronic diastolic (congestive) heart failure: Secondary | ICD-10-CM | POA: Diagnosis not present

## 2020-08-14 DIAGNOSIS — M1991 Primary osteoarthritis, unspecified site: Secondary | ICD-10-CM | POA: Diagnosis not present

## 2020-08-14 DIAGNOSIS — M549 Dorsalgia, unspecified: Secondary | ICD-10-CM | POA: Diagnosis not present

## 2020-08-14 DIAGNOSIS — Z7982 Long term (current) use of aspirin: Secondary | ICD-10-CM | POA: Diagnosis not present

## 2020-08-14 DIAGNOSIS — H5461 Unqualified visual loss, right eye, normal vision left eye: Secondary | ICD-10-CM | POA: Diagnosis not present

## 2020-08-14 DIAGNOSIS — E1122 Type 2 diabetes mellitus with diabetic chronic kidney disease: Secondary | ICD-10-CM | POA: Diagnosis not present

## 2020-08-14 DIAGNOSIS — G4733 Obstructive sleep apnea (adult) (pediatric): Secondary | ICD-10-CM | POA: Diagnosis not present

## 2020-08-14 DIAGNOSIS — Z7984 Long term (current) use of oral hypoglycemic drugs: Secondary | ICD-10-CM | POA: Diagnosis not present

## 2020-08-14 DIAGNOSIS — N189 Chronic kidney disease, unspecified: Secondary | ICD-10-CM | POA: Diagnosis not present

## 2020-08-14 DIAGNOSIS — I130 Hypertensive heart and chronic kidney disease with heart failure and stage 1 through stage 4 chronic kidney disease, or unspecified chronic kidney disease: Secondary | ICD-10-CM | POA: Diagnosis not present

## 2020-08-14 DIAGNOSIS — J449 Chronic obstructive pulmonary disease, unspecified: Secondary | ICD-10-CM | POA: Diagnosis not present

## 2020-08-14 DIAGNOSIS — M109 Gout, unspecified: Secondary | ICD-10-CM | POA: Diagnosis not present

## 2020-08-14 DIAGNOSIS — G8929 Other chronic pain: Secondary | ICD-10-CM | POA: Diagnosis not present

## 2020-08-14 DIAGNOSIS — K219 Gastro-esophageal reflux disease without esophagitis: Secondary | ICD-10-CM | POA: Diagnosis not present

## 2020-08-16 DIAGNOSIS — H9193 Unspecified hearing loss, bilateral: Secondary | ICD-10-CM | POA: Diagnosis not present

## 2020-08-16 DIAGNOSIS — G4733 Obstructive sleep apnea (adult) (pediatric): Secondary | ICD-10-CM | POA: Diagnosis not present

## 2020-08-16 DIAGNOSIS — N189 Chronic kidney disease, unspecified: Secondary | ICD-10-CM | POA: Diagnosis not present

## 2020-08-16 DIAGNOSIS — K219 Gastro-esophageal reflux disease without esophagitis: Secondary | ICD-10-CM | POA: Diagnosis not present

## 2020-08-16 DIAGNOSIS — M549 Dorsalgia, unspecified: Secondary | ICD-10-CM | POA: Diagnosis not present

## 2020-08-16 DIAGNOSIS — E1122 Type 2 diabetes mellitus with diabetic chronic kidney disease: Secondary | ICD-10-CM | POA: Diagnosis not present

## 2020-08-16 DIAGNOSIS — I5032 Chronic diastolic (congestive) heart failure: Secondary | ICD-10-CM | POA: Diagnosis not present

## 2020-08-16 DIAGNOSIS — F17210 Nicotine dependence, cigarettes, uncomplicated: Secondary | ICD-10-CM | POA: Diagnosis not present

## 2020-08-16 DIAGNOSIS — Z7984 Long term (current) use of oral hypoglycemic drugs: Secondary | ICD-10-CM | POA: Diagnosis not present

## 2020-08-16 DIAGNOSIS — I2510 Atherosclerotic heart disease of native coronary artery without angina pectoris: Secondary | ICD-10-CM | POA: Diagnosis not present

## 2020-08-16 DIAGNOSIS — M1991 Primary osteoarthritis, unspecified site: Secondary | ICD-10-CM | POA: Diagnosis not present

## 2020-08-16 DIAGNOSIS — I130 Hypertensive heart and chronic kidney disease with heart failure and stage 1 through stage 4 chronic kidney disease, or unspecified chronic kidney disease: Secondary | ICD-10-CM | POA: Diagnosis not present

## 2020-08-16 DIAGNOSIS — Z7982 Long term (current) use of aspirin: Secondary | ICD-10-CM | POA: Diagnosis not present

## 2020-08-16 DIAGNOSIS — G8929 Other chronic pain: Secondary | ICD-10-CM | POA: Diagnosis not present

## 2020-08-16 DIAGNOSIS — J449 Chronic obstructive pulmonary disease, unspecified: Secondary | ICD-10-CM | POA: Diagnosis not present

## 2020-08-16 DIAGNOSIS — G47411 Narcolepsy with cataplexy: Secondary | ICD-10-CM | POA: Diagnosis not present

## 2020-08-16 DIAGNOSIS — M109 Gout, unspecified: Secondary | ICD-10-CM | POA: Diagnosis not present

## 2020-08-16 DIAGNOSIS — F32A Depression, unspecified: Secondary | ICD-10-CM | POA: Diagnosis not present

## 2020-08-16 DIAGNOSIS — H5461 Unqualified visual loss, right eye, normal vision left eye: Secondary | ICD-10-CM | POA: Diagnosis not present

## 2020-08-17 DIAGNOSIS — J9601 Acute respiratory failure with hypoxia: Secondary | ICD-10-CM | POA: Diagnosis not present

## 2020-08-17 DIAGNOSIS — J441 Chronic obstructive pulmonary disease with (acute) exacerbation: Secondary | ICD-10-CM | POA: Diagnosis not present

## 2020-08-20 DIAGNOSIS — L570 Actinic keratosis: Secondary | ICD-10-CM | POA: Diagnosis not present

## 2020-08-20 DIAGNOSIS — L578 Other skin changes due to chronic exposure to nonionizing radiation: Secondary | ICD-10-CM | POA: Diagnosis not present

## 2020-08-20 DIAGNOSIS — L821 Other seborrheic keratosis: Secondary | ICD-10-CM | POA: Diagnosis not present

## 2020-08-20 DIAGNOSIS — L820 Inflamed seborrheic keratosis: Secondary | ICD-10-CM | POA: Diagnosis not present

## 2020-08-21 DIAGNOSIS — M1991 Primary osteoarthritis, unspecified site: Secondary | ICD-10-CM | POA: Diagnosis not present

## 2020-08-21 DIAGNOSIS — I2510 Atherosclerotic heart disease of native coronary artery without angina pectoris: Secondary | ICD-10-CM | POA: Diagnosis not present

## 2020-08-21 DIAGNOSIS — F32A Depression, unspecified: Secondary | ICD-10-CM | POA: Diagnosis not present

## 2020-08-21 DIAGNOSIS — F17210 Nicotine dependence, cigarettes, uncomplicated: Secondary | ICD-10-CM | POA: Diagnosis not present

## 2020-08-21 DIAGNOSIS — G4733 Obstructive sleep apnea (adult) (pediatric): Secondary | ICD-10-CM | POA: Diagnosis not present

## 2020-08-21 DIAGNOSIS — I130 Hypertensive heart and chronic kidney disease with heart failure and stage 1 through stage 4 chronic kidney disease, or unspecified chronic kidney disease: Secondary | ICD-10-CM | POA: Diagnosis not present

## 2020-08-21 DIAGNOSIS — K219 Gastro-esophageal reflux disease without esophagitis: Secondary | ICD-10-CM | POA: Diagnosis not present

## 2020-08-21 DIAGNOSIS — G8929 Other chronic pain: Secondary | ICD-10-CM | POA: Diagnosis not present

## 2020-08-21 DIAGNOSIS — M549 Dorsalgia, unspecified: Secondary | ICD-10-CM | POA: Diagnosis not present

## 2020-08-21 DIAGNOSIS — H9193 Unspecified hearing loss, bilateral: Secondary | ICD-10-CM | POA: Diagnosis not present

## 2020-08-21 DIAGNOSIS — G47411 Narcolepsy with cataplexy: Secondary | ICD-10-CM | POA: Diagnosis not present

## 2020-08-21 DIAGNOSIS — E1122 Type 2 diabetes mellitus with diabetic chronic kidney disease: Secondary | ICD-10-CM | POA: Diagnosis not present

## 2020-08-21 DIAGNOSIS — I5032 Chronic diastolic (congestive) heart failure: Secondary | ICD-10-CM | POA: Diagnosis not present

## 2020-08-21 DIAGNOSIS — M109 Gout, unspecified: Secondary | ICD-10-CM | POA: Diagnosis not present

## 2020-08-21 DIAGNOSIS — Z7984 Long term (current) use of oral hypoglycemic drugs: Secondary | ICD-10-CM | POA: Diagnosis not present

## 2020-08-21 DIAGNOSIS — N189 Chronic kidney disease, unspecified: Secondary | ICD-10-CM | POA: Diagnosis not present

## 2020-08-21 DIAGNOSIS — J449 Chronic obstructive pulmonary disease, unspecified: Secondary | ICD-10-CM | POA: Diagnosis not present

## 2020-08-21 DIAGNOSIS — H5461 Unqualified visual loss, right eye, normal vision left eye: Secondary | ICD-10-CM | POA: Diagnosis not present

## 2020-08-21 DIAGNOSIS — Z7982 Long term (current) use of aspirin: Secondary | ICD-10-CM | POA: Diagnosis not present

## 2020-08-23 DIAGNOSIS — M549 Dorsalgia, unspecified: Secondary | ICD-10-CM | POA: Diagnosis not present

## 2020-08-23 DIAGNOSIS — F32A Depression, unspecified: Secondary | ICD-10-CM | POA: Diagnosis not present

## 2020-08-23 DIAGNOSIS — H9193 Unspecified hearing loss, bilateral: Secondary | ICD-10-CM | POA: Diagnosis not present

## 2020-08-23 DIAGNOSIS — Z7984 Long term (current) use of oral hypoglycemic drugs: Secondary | ICD-10-CM | POA: Diagnosis not present

## 2020-08-23 DIAGNOSIS — G4733 Obstructive sleep apnea (adult) (pediatric): Secondary | ICD-10-CM | POA: Diagnosis not present

## 2020-08-23 DIAGNOSIS — Z7982 Long term (current) use of aspirin: Secondary | ICD-10-CM | POA: Diagnosis not present

## 2020-08-23 DIAGNOSIS — G47411 Narcolepsy with cataplexy: Secondary | ICD-10-CM | POA: Diagnosis not present

## 2020-08-23 DIAGNOSIS — M1991 Primary osteoarthritis, unspecified site: Secondary | ICD-10-CM | POA: Diagnosis not present

## 2020-08-23 DIAGNOSIS — E1122 Type 2 diabetes mellitus with diabetic chronic kidney disease: Secondary | ICD-10-CM | POA: Diagnosis not present

## 2020-08-23 DIAGNOSIS — I2510 Atherosclerotic heart disease of native coronary artery without angina pectoris: Secondary | ICD-10-CM | POA: Diagnosis not present

## 2020-08-23 DIAGNOSIS — I5032 Chronic diastolic (congestive) heart failure: Secondary | ICD-10-CM | POA: Diagnosis not present

## 2020-08-23 DIAGNOSIS — I130 Hypertensive heart and chronic kidney disease with heart failure and stage 1 through stage 4 chronic kidney disease, or unspecified chronic kidney disease: Secondary | ICD-10-CM | POA: Diagnosis not present

## 2020-08-23 DIAGNOSIS — G8929 Other chronic pain: Secondary | ICD-10-CM | POA: Diagnosis not present

## 2020-08-23 DIAGNOSIS — K219 Gastro-esophageal reflux disease without esophagitis: Secondary | ICD-10-CM | POA: Diagnosis not present

## 2020-08-23 DIAGNOSIS — M109 Gout, unspecified: Secondary | ICD-10-CM | POA: Diagnosis not present

## 2020-08-23 DIAGNOSIS — H5461 Unqualified visual loss, right eye, normal vision left eye: Secondary | ICD-10-CM | POA: Diagnosis not present

## 2020-08-23 DIAGNOSIS — N189 Chronic kidney disease, unspecified: Secondary | ICD-10-CM | POA: Diagnosis not present

## 2020-08-23 DIAGNOSIS — J449 Chronic obstructive pulmonary disease, unspecified: Secondary | ICD-10-CM | POA: Diagnosis not present

## 2020-08-23 DIAGNOSIS — F17210 Nicotine dependence, cigarettes, uncomplicated: Secondary | ICD-10-CM | POA: Diagnosis not present

## 2020-08-28 DIAGNOSIS — G47411 Narcolepsy with cataplexy: Secondary | ICD-10-CM | POA: Diagnosis not present

## 2020-08-28 DIAGNOSIS — Z7984 Long term (current) use of oral hypoglycemic drugs: Secondary | ICD-10-CM | POA: Diagnosis not present

## 2020-08-28 DIAGNOSIS — I2510 Atherosclerotic heart disease of native coronary artery without angina pectoris: Secondary | ICD-10-CM | POA: Diagnosis not present

## 2020-08-28 DIAGNOSIS — Z7982 Long term (current) use of aspirin: Secondary | ICD-10-CM | POA: Diagnosis not present

## 2020-08-28 DIAGNOSIS — G8929 Other chronic pain: Secondary | ICD-10-CM | POA: Diagnosis not present

## 2020-08-28 DIAGNOSIS — H9193 Unspecified hearing loss, bilateral: Secondary | ICD-10-CM | POA: Diagnosis not present

## 2020-08-28 DIAGNOSIS — M1991 Primary osteoarthritis, unspecified site: Secondary | ICD-10-CM | POA: Diagnosis not present

## 2020-08-28 DIAGNOSIS — F17210 Nicotine dependence, cigarettes, uncomplicated: Secondary | ICD-10-CM | POA: Diagnosis not present

## 2020-08-28 DIAGNOSIS — E1122 Type 2 diabetes mellitus with diabetic chronic kidney disease: Secondary | ICD-10-CM | POA: Diagnosis not present

## 2020-08-28 DIAGNOSIS — J449 Chronic obstructive pulmonary disease, unspecified: Secondary | ICD-10-CM | POA: Diagnosis not present

## 2020-08-28 DIAGNOSIS — M549 Dorsalgia, unspecified: Secondary | ICD-10-CM | POA: Diagnosis not present

## 2020-08-28 DIAGNOSIS — K219 Gastro-esophageal reflux disease without esophagitis: Secondary | ICD-10-CM | POA: Diagnosis not present

## 2020-08-28 DIAGNOSIS — H5461 Unqualified visual loss, right eye, normal vision left eye: Secondary | ICD-10-CM | POA: Diagnosis not present

## 2020-08-28 DIAGNOSIS — I130 Hypertensive heart and chronic kidney disease with heart failure and stage 1 through stage 4 chronic kidney disease, or unspecified chronic kidney disease: Secondary | ICD-10-CM | POA: Diagnosis not present

## 2020-08-28 DIAGNOSIS — M109 Gout, unspecified: Secondary | ICD-10-CM | POA: Diagnosis not present

## 2020-08-28 DIAGNOSIS — N189 Chronic kidney disease, unspecified: Secondary | ICD-10-CM | POA: Diagnosis not present

## 2020-08-28 DIAGNOSIS — F32A Depression, unspecified: Secondary | ICD-10-CM | POA: Diagnosis not present

## 2020-08-28 DIAGNOSIS — G4733 Obstructive sleep apnea (adult) (pediatric): Secondary | ICD-10-CM | POA: Diagnosis not present

## 2020-08-28 DIAGNOSIS — I5032 Chronic diastolic (congestive) heart failure: Secondary | ICD-10-CM | POA: Diagnosis not present

## 2020-08-31 DIAGNOSIS — H5461 Unqualified visual loss, right eye, normal vision left eye: Secondary | ICD-10-CM | POA: Diagnosis not present

## 2020-08-31 DIAGNOSIS — I130 Hypertensive heart and chronic kidney disease with heart failure and stage 1 through stage 4 chronic kidney disease, or unspecified chronic kidney disease: Secondary | ICD-10-CM | POA: Diagnosis not present

## 2020-08-31 DIAGNOSIS — I5032 Chronic diastolic (congestive) heart failure: Secondary | ICD-10-CM | POA: Diagnosis not present

## 2020-08-31 DIAGNOSIS — Z7982 Long term (current) use of aspirin: Secondary | ICD-10-CM | POA: Diagnosis not present

## 2020-08-31 DIAGNOSIS — G4733 Obstructive sleep apnea (adult) (pediatric): Secondary | ICD-10-CM | POA: Diagnosis not present

## 2020-08-31 DIAGNOSIS — M1991 Primary osteoarthritis, unspecified site: Secondary | ICD-10-CM | POA: Diagnosis not present

## 2020-08-31 DIAGNOSIS — G47411 Narcolepsy with cataplexy: Secondary | ICD-10-CM | POA: Diagnosis not present

## 2020-08-31 DIAGNOSIS — G8929 Other chronic pain: Secondary | ICD-10-CM | POA: Diagnosis not present

## 2020-08-31 DIAGNOSIS — I2510 Atherosclerotic heart disease of native coronary artery without angina pectoris: Secondary | ICD-10-CM | POA: Diagnosis not present

## 2020-08-31 DIAGNOSIS — M549 Dorsalgia, unspecified: Secondary | ICD-10-CM | POA: Diagnosis not present

## 2020-08-31 DIAGNOSIS — J449 Chronic obstructive pulmonary disease, unspecified: Secondary | ICD-10-CM | POA: Diagnosis not present

## 2020-08-31 DIAGNOSIS — E1122 Type 2 diabetes mellitus with diabetic chronic kidney disease: Secondary | ICD-10-CM | POA: Diagnosis not present

## 2020-08-31 DIAGNOSIS — F32A Depression, unspecified: Secondary | ICD-10-CM | POA: Diagnosis not present

## 2020-08-31 DIAGNOSIS — N189 Chronic kidney disease, unspecified: Secondary | ICD-10-CM | POA: Diagnosis not present

## 2020-08-31 DIAGNOSIS — M109 Gout, unspecified: Secondary | ICD-10-CM | POA: Diagnosis not present

## 2020-08-31 DIAGNOSIS — K219 Gastro-esophageal reflux disease without esophagitis: Secondary | ICD-10-CM | POA: Diagnosis not present

## 2020-08-31 DIAGNOSIS — F17210 Nicotine dependence, cigarettes, uncomplicated: Secondary | ICD-10-CM | POA: Diagnosis not present

## 2020-08-31 DIAGNOSIS — H9193 Unspecified hearing loss, bilateral: Secondary | ICD-10-CM | POA: Diagnosis not present

## 2020-08-31 DIAGNOSIS — Z7984 Long term (current) use of oral hypoglycemic drugs: Secondary | ICD-10-CM | POA: Diagnosis not present

## 2020-09-07 DIAGNOSIS — F17210 Nicotine dependence, cigarettes, uncomplicated: Secondary | ICD-10-CM | POA: Diagnosis not present

## 2020-09-07 DIAGNOSIS — I5032 Chronic diastolic (congestive) heart failure: Secondary | ICD-10-CM | POA: Diagnosis not present

## 2020-09-07 DIAGNOSIS — M1991 Primary osteoarthritis, unspecified site: Secondary | ICD-10-CM | POA: Diagnosis not present

## 2020-09-07 DIAGNOSIS — M109 Gout, unspecified: Secondary | ICD-10-CM | POA: Diagnosis not present

## 2020-09-07 DIAGNOSIS — Z7984 Long term (current) use of oral hypoglycemic drugs: Secondary | ICD-10-CM | POA: Diagnosis not present

## 2020-09-07 DIAGNOSIS — F32A Depression, unspecified: Secondary | ICD-10-CM | POA: Diagnosis not present

## 2020-09-07 DIAGNOSIS — N189 Chronic kidney disease, unspecified: Secondary | ICD-10-CM | POA: Diagnosis not present

## 2020-09-07 DIAGNOSIS — H9193 Unspecified hearing loss, bilateral: Secondary | ICD-10-CM | POA: Diagnosis not present

## 2020-09-07 DIAGNOSIS — I2510 Atherosclerotic heart disease of native coronary artery without angina pectoris: Secondary | ICD-10-CM | POA: Diagnosis not present

## 2020-09-07 DIAGNOSIS — I130 Hypertensive heart and chronic kidney disease with heart failure and stage 1 through stage 4 chronic kidney disease, or unspecified chronic kidney disease: Secondary | ICD-10-CM | POA: Diagnosis not present

## 2020-09-07 DIAGNOSIS — G8929 Other chronic pain: Secondary | ICD-10-CM | POA: Diagnosis not present

## 2020-09-07 DIAGNOSIS — E1122 Type 2 diabetes mellitus with diabetic chronic kidney disease: Secondary | ICD-10-CM | POA: Diagnosis not present

## 2020-09-07 DIAGNOSIS — G4733 Obstructive sleep apnea (adult) (pediatric): Secondary | ICD-10-CM | POA: Diagnosis not present

## 2020-09-07 DIAGNOSIS — M549 Dorsalgia, unspecified: Secondary | ICD-10-CM | POA: Diagnosis not present

## 2020-09-07 DIAGNOSIS — K219 Gastro-esophageal reflux disease without esophagitis: Secondary | ICD-10-CM | POA: Diagnosis not present

## 2020-09-07 DIAGNOSIS — G47411 Narcolepsy with cataplexy: Secondary | ICD-10-CM | POA: Diagnosis not present

## 2020-09-07 DIAGNOSIS — H5461 Unqualified visual loss, right eye, normal vision left eye: Secondary | ICD-10-CM | POA: Diagnosis not present

## 2020-09-07 DIAGNOSIS — Z7982 Long term (current) use of aspirin: Secondary | ICD-10-CM | POA: Diagnosis not present

## 2020-09-07 DIAGNOSIS — J449 Chronic obstructive pulmonary disease, unspecified: Secondary | ICD-10-CM | POA: Diagnosis not present

## 2020-09-12 DIAGNOSIS — I5032 Chronic diastolic (congestive) heart failure: Secondary | ICD-10-CM | POA: Diagnosis not present

## 2020-09-12 DIAGNOSIS — K219 Gastro-esophageal reflux disease without esophagitis: Secondary | ICD-10-CM | POA: Diagnosis not present

## 2020-09-12 DIAGNOSIS — F17210 Nicotine dependence, cigarettes, uncomplicated: Secondary | ICD-10-CM | POA: Diagnosis not present

## 2020-09-12 DIAGNOSIS — M1991 Primary osteoarthritis, unspecified site: Secondary | ICD-10-CM | POA: Diagnosis not present

## 2020-09-12 DIAGNOSIS — Z7982 Long term (current) use of aspirin: Secondary | ICD-10-CM | POA: Diagnosis not present

## 2020-09-12 DIAGNOSIS — J449 Chronic obstructive pulmonary disease, unspecified: Secondary | ICD-10-CM | POA: Diagnosis not present

## 2020-09-12 DIAGNOSIS — N189 Chronic kidney disease, unspecified: Secondary | ICD-10-CM | POA: Diagnosis not present

## 2020-09-12 DIAGNOSIS — F32A Depression, unspecified: Secondary | ICD-10-CM | POA: Diagnosis not present

## 2020-09-12 DIAGNOSIS — M549 Dorsalgia, unspecified: Secondary | ICD-10-CM | POA: Diagnosis not present

## 2020-09-12 DIAGNOSIS — H5461 Unqualified visual loss, right eye, normal vision left eye: Secondary | ICD-10-CM | POA: Diagnosis not present

## 2020-09-12 DIAGNOSIS — G4733 Obstructive sleep apnea (adult) (pediatric): Secondary | ICD-10-CM | POA: Diagnosis not present

## 2020-09-12 DIAGNOSIS — I2510 Atherosclerotic heart disease of native coronary artery without angina pectoris: Secondary | ICD-10-CM | POA: Diagnosis not present

## 2020-09-12 DIAGNOSIS — Z7984 Long term (current) use of oral hypoglycemic drugs: Secondary | ICD-10-CM | POA: Diagnosis not present

## 2020-09-12 DIAGNOSIS — M109 Gout, unspecified: Secondary | ICD-10-CM | POA: Diagnosis not present

## 2020-09-12 DIAGNOSIS — H9193 Unspecified hearing loss, bilateral: Secondary | ICD-10-CM | POA: Diagnosis not present

## 2020-09-12 DIAGNOSIS — G8929 Other chronic pain: Secondary | ICD-10-CM | POA: Diagnosis not present

## 2020-09-12 DIAGNOSIS — E1122 Type 2 diabetes mellitus with diabetic chronic kidney disease: Secondary | ICD-10-CM | POA: Diagnosis not present

## 2020-09-12 DIAGNOSIS — I130 Hypertensive heart and chronic kidney disease with heart failure and stage 1 through stage 4 chronic kidney disease, or unspecified chronic kidney disease: Secondary | ICD-10-CM | POA: Diagnosis not present

## 2020-09-12 DIAGNOSIS — G47411 Narcolepsy with cataplexy: Secondary | ICD-10-CM | POA: Diagnosis not present

## 2020-09-14 DIAGNOSIS — J9601 Acute respiratory failure with hypoxia: Secondary | ICD-10-CM | POA: Diagnosis not present

## 2020-09-14 DIAGNOSIS — J441 Chronic obstructive pulmonary disease with (acute) exacerbation: Secondary | ICD-10-CM | POA: Diagnosis not present

## 2020-09-21 DIAGNOSIS — I10 Essential (primary) hypertension: Secondary | ICD-10-CM | POA: Diagnosis not present

## 2020-09-21 DIAGNOSIS — I5032 Chronic diastolic (congestive) heart failure: Secondary | ICD-10-CM | POA: Diagnosis not present

## 2020-09-21 DIAGNOSIS — J449 Chronic obstructive pulmonary disease, unspecified: Secondary | ICD-10-CM | POA: Diagnosis not present

## 2020-09-21 DIAGNOSIS — R262 Difficulty in walking, not elsewhere classified: Secondary | ICD-10-CM | POA: Diagnosis not present

## 2020-09-21 DIAGNOSIS — E1165 Type 2 diabetes mellitus with hyperglycemia: Secondary | ICD-10-CM | POA: Diagnosis not present

## 2020-09-21 DIAGNOSIS — M1990 Unspecified osteoarthritis, unspecified site: Secondary | ICD-10-CM | POA: Diagnosis not present

## 2020-10-12 DIAGNOSIS — L820 Inflamed seborrheic keratosis: Secondary | ICD-10-CM | POA: Diagnosis not present

## 2020-10-15 DIAGNOSIS — J441 Chronic obstructive pulmonary disease with (acute) exacerbation: Secondary | ICD-10-CM | POA: Diagnosis not present

## 2020-10-15 DIAGNOSIS — J9601 Acute respiratory failure with hypoxia: Secondary | ICD-10-CM | POA: Diagnosis not present

## 2020-11-14 DIAGNOSIS — J441 Chronic obstructive pulmonary disease with (acute) exacerbation: Secondary | ICD-10-CM | POA: Diagnosis not present

## 2020-11-14 DIAGNOSIS — J9601 Acute respiratory failure with hypoxia: Secondary | ICD-10-CM | POA: Diagnosis not present

## 2020-12-15 DIAGNOSIS — J9601 Acute respiratory failure with hypoxia: Secondary | ICD-10-CM | POA: Diagnosis not present

## 2020-12-15 DIAGNOSIS — J441 Chronic obstructive pulmonary disease with (acute) exacerbation: Secondary | ICD-10-CM | POA: Diagnosis not present

## 2021-01-04 DIAGNOSIS — S91302A Unspecified open wound, left foot, initial encounter: Secondary | ICD-10-CM | POA: Diagnosis not present

## 2021-01-04 DIAGNOSIS — L97429 Non-pressure chronic ulcer of left heel and midfoot with unspecified severity: Secondary | ICD-10-CM | POA: Diagnosis not present

## 2021-01-04 DIAGNOSIS — E08621 Diabetes mellitus due to underlying condition with foot ulcer: Secondary | ICD-10-CM | POA: Diagnosis not present

## 2021-01-04 DIAGNOSIS — E119 Type 2 diabetes mellitus without complications: Secondary | ICD-10-CM | POA: Diagnosis not present

## 2021-01-04 DIAGNOSIS — M19072 Primary osteoarthritis, left ankle and foot: Secondary | ICD-10-CM | POA: Diagnosis not present

## 2021-01-04 DIAGNOSIS — E11621 Type 2 diabetes mellitus with foot ulcer: Secondary | ICD-10-CM | POA: Diagnosis not present

## 2021-01-04 DIAGNOSIS — M7989 Other specified soft tissue disorders: Secondary | ICD-10-CM | POA: Diagnosis not present

## 2021-01-04 DIAGNOSIS — L97529 Non-pressure chronic ulcer of other part of left foot with unspecified severity: Secondary | ICD-10-CM | POA: Diagnosis not present

## 2021-01-09 ENCOUNTER — Other Ambulatory Visit: Payer: Self-pay

## 2021-01-09 ENCOUNTER — Ambulatory Visit (INDEPENDENT_AMBULATORY_CARE_PROVIDER_SITE_OTHER): Payer: Medicare Other | Admitting: Sports Medicine

## 2021-01-09 ENCOUNTER — Encounter: Payer: Self-pay | Admitting: Sports Medicine

## 2021-01-09 ENCOUNTER — Other Ambulatory Visit: Payer: Self-pay | Admitting: Sports Medicine

## 2021-01-09 DIAGNOSIS — I739 Peripheral vascular disease, unspecified: Secondary | ICD-10-CM | POA: Diagnosis not present

## 2021-01-09 DIAGNOSIS — L97521 Non-pressure chronic ulcer of other part of left foot limited to breakdown of skin: Secondary | ICD-10-CM | POA: Diagnosis not present

## 2021-01-09 DIAGNOSIS — R54 Age-related physical debility: Secondary | ICD-10-CM

## 2021-01-09 DIAGNOSIS — L03116 Cellulitis of left lower limb: Secondary | ICD-10-CM | POA: Diagnosis not present

## 2021-01-09 DIAGNOSIS — F0151 Vascular dementia with behavioral disturbance: Secondary | ICD-10-CM

## 2021-01-09 DIAGNOSIS — M79672 Pain in left foot: Secondary | ICD-10-CM

## 2021-01-09 DIAGNOSIS — F01518 Vascular dementia, unspecified severity, with other behavioral disturbance: Secondary | ICD-10-CM

## 2021-01-09 MED ORDER — ACETAMINOPHEN-CODEINE #3 300-30 MG PO TABS: 1.0000 | ORAL_TABLET | Freq: Three times a day (TID) | ORAL | 0 refills | Status: DC | PRN

## 2021-01-09 NOTE — Progress Notes (Signed)
Subjective: Luke Allen is a 86 y.o. male patient seen in office for evaluation of ulceration of the left foot.  Patient denies a history of diabetes and is assisted by daughter/power of attorney who helps to care for patient.  Patient reports that he wants to know everything that is going on with his care because this is his body.  Per chart review patient went to the ER at [facility] on [address] for left foot wounds and was treated with Keflex and told to use antibiotic cream and gauze dressing of which the daughter/power of attorney has been applying daily.  Daughter/power of attorney reports that she thinks this may have started after he broke his ribs and had to spend a lot of time in bed.  No other issues noted.  Review of systems noncontributory however patient constantly reports pain.  Patient Active Problem List   Diagnosis Date Noted   Abdominal pain 10/01/2016   Age-related physical debility 05/03/2016   Cellulitis of left foot 05/03/2016   COPD exacerbation ([place]) 05/03/2016   Essential hypertension 05/03/2016   Renal insufficiency 05/03/2016   Tobacco abuse 09/05/2014   CVA (cerebral infarction) 08/29/2013   Central retinal artery occlusion of right eye 08/28/2013   Acid reflux    Anxiety    Sleep apnea    Hypertensive emergency 08/27/2013   Current Outpatient Medications on File Prior to Visit  Medication Sig Dispense Refill   allopurinol (ZYLOPRIM) 300 MG tablet Take 300 mg by mouth daily.   12   amLODipine (NORVASC) 10 MG tablet Take 10 mg by mouth daily.     cetirizine (ZYRTEC) 10 MG tablet Take 10 mg by mouth daily as needed for allergies.     divalproex (DEPAKOTE ER) 250 MG 24 hr tablet Take 250 mg by mouth daily.     furosemide (LASIX) 40 MG tablet Take 1 tablet by mouth daily.  0   lactulose (CHRONULAC) 10 GM/15ML solution Take 10 g by mouth 2 (two) times daily.     lisinopril-hydrochlorothiazide (PRINZIDE,ZESTORETIC) 20-12.5 MG tablet Take 1 tablet by mouth daily.      metFORMIN (GLUCOPHAGE) 500 MG tablet Take 500 mg by mouth 2 (two) times daily.     metoprolol (LOPRESSOR) 50 MG tablet Take 50 mg by mouth 2 (two) times daily.     pantoprazole (PROTONIX) 40 MG tablet Take 40 mg by mouth 2 (two) times daily.     SENNA-S 8.6-50 MG tablet Take 2 tablets by mouth 2 (two) times daily.     tamsulosin (FLOMAX) 0.4 MG CAPS capsule Take 0.4 mg by mouth at bedtime.     venlafaxine XR (EFFEXOR-XR) 150 MG 24 hr capsule Take 300 mg by mouth daily.     vitamin C (ASCORBIC ACID) 500 MG tablet Take 500 mg by mouth daily.     No current facility-administered medications on file prior to visit.   Allergies  Allergen Reactions   Morphine And Related    Naproxen     Other reaction(s): FLUSHING   Other     Theraflu Cold and Cough ME   Tiotropium     No results found for this or any previous visit (from the past 2160 hour(s)).  Objective: There were no vitals filed for this visit.  General: Patient is awake, alert, oriented x 2 and in no acute distress except when his daughter is in the room he is very combative and is noted to be saying cuss words.  Dermatology: Skin is warm and  dry bilateral with a partial thickness ulceration present Left plantar lateral heel and left second and third toes.  The ulcerations measures 2 x 2 cm and the toe ulcerations measures less than 0.5 cm respectively. There is a keratotic border with a granular base. The ulcerations do not probe to bone. There is no malodor, no active drainage, faint erythema, minimal edema. No other acute signs of infection.   Vascular: Dorsalis Pedis pulse = 1/4 Bilateral,  Posterior Tibial pulse = 0/4 Bilateral,  Capillary Fill Time < 5 seconds  Neurologic: Gross sensation present via light touch bilateral.  Musculosketal: There is minimal pain with palpation to ulcerated areas. No pain with compression to calves bilateral. No gross bony deformities noted bilateral.  No results for input(s): GRAMSTAIN,  LABORGA in the last 8760 hours.  Assessment and Plan:  Problem List Items Addressed This Visit       Musculoskeletal and Integument   Cellulitis of left foot     Other   Age-related physical debility   Other Visit Diagnoses     Left foot pain    -  Primary   Relevant Orders   WOUND CULTURE   Ulcer of left foot, limited to breakdown of skin ([place])       PVD (peripheral vascular disease) ([place])       Vascular dementia with behavior disturbance (H[facility])       Relevant Medications   divalproex (DEPAKOTE ER) 250 MG 24 hr tablet   venlafaxine XR (EFFEXOR-XR) 150 MG 24 hr capsule        -Examined patient and discussed the progression of the wound and treatment alternatives. -Xrays reviewed from [facility] dated 01/04/2021 negative for osteomyelitis - Excisionally dedbrided ulceration at left heel and second and third toes to healthy bleeding borders removing nonviable tissue using a sterile chisel blade. Wound measures post debridement as above. Wound was debrided to the level of the dermis with viable wound base exposed to promote healing. Hemostasis was achieved with manuel pressure. Patient tolerated procedure well without any discomfort or anesthesia necessary for this wound debridement.  -Wound culture obtained from the left heel and we will call patient if there is a need to continue with more antibiotics meanwhile continue with Keflex as given by ER -Applied Iodosorb and dry sterile Band-Aid dressing and instructed patient's daughter/power of attorney to continue with daily dressings at home consisting of the same -Prescribed Tylenol 3 this time only to help with pain - Advised patient to go to the ER or return to office if the wound worsens or if constitutional symptoms are present. -Continue with wheelchair and advised daughter/power of attorney on pillow offloading to prevent worsening of heel ulcer -Patient to return to office in 3 weeks for follow up wound care or sooner if  problems arise.  Landis Martins, DPM

## 2021-01-10 ENCOUNTER — Other Ambulatory Visit: Payer: Self-pay | Admitting: Sports Medicine

## 2021-01-10 MED ORDER — ACETAMINOPHEN-CODEINE #3 300-30 MG PO TABS: 1.0000 | ORAL_TABLET | Freq: Three times a day (TID) | ORAL | 0 refills | Status: AC | PRN

## 2021-01-10 NOTE — Progress Notes (Signed)
Rx sent to correct pharmacy.

## 2021-01-11 ENCOUNTER — Telehealth: Payer: Self-pay | Admitting: *Deleted

## 2021-01-11 LAB — WOUND CULTURE

## 2021-01-11 NOTE — Telephone Encounter (Signed)
-----   Message from Landis Martins, [place] sent at 01/10/2021  6:02 PM EDT ----- It went to [facility]... I will changes the Rx to [facility] ----- Message ----- From: Viviana Simpler, [facility] Sent: 01/10/2021   2:57 PM EDT To: Landis Martins, DPM  Patient's care giver called and stated that there was to be a prescription sent to [facility] and there has not been one sent over. Lattie Haw

## 2021-01-11 NOTE — Telephone Encounter (Signed)
Called and spoke with patient's care giver (sharon) and relayed the message per Dr Cannon Kettle. Lattie Haw

## 2021-01-14 DIAGNOSIS — J441 Chronic obstructive pulmonary disease with (acute) exacerbation: Secondary | ICD-10-CM | POA: Diagnosis not present

## 2021-01-14 DIAGNOSIS — J9601 Acute respiratory failure with hypoxia: Secondary | ICD-10-CM | POA: Diagnosis not present

## 2021-01-30 ENCOUNTER — Ambulatory Visit (INDEPENDENT_AMBULATORY_CARE_PROVIDER_SITE_OTHER): Payer: Medicare Other | Admitting: Sports Medicine

## 2021-01-30 ENCOUNTER — Encounter: Payer: Self-pay | Admitting: Sports Medicine

## 2021-01-30 ENCOUNTER — Other Ambulatory Visit: Payer: Self-pay

## 2021-01-30 DIAGNOSIS — L97521 Non-pressure chronic ulcer of other part of left foot limited to breakdown of skin: Secondary | ICD-10-CM | POA: Diagnosis not present

## 2021-01-30 DIAGNOSIS — M79672 Pain in left foot: Secondary | ICD-10-CM

## 2021-01-30 DIAGNOSIS — M86172 Other acute osteomyelitis, left ankle and foot: Secondary | ICD-10-CM

## 2021-01-30 DIAGNOSIS — F01518 Vascular dementia, unspecified severity, with other behavioral disturbance: Secondary | ICD-10-CM

## 2021-01-30 DIAGNOSIS — I739 Peripheral vascular disease, unspecified: Secondary | ICD-10-CM

## 2021-01-30 DIAGNOSIS — F0151 Vascular dementia with behavioral disturbance: Secondary | ICD-10-CM

## 2021-01-30 DIAGNOSIS — L03116 Cellulitis of left lower limb: Secondary | ICD-10-CM

## 2021-01-30 MED ORDER — SULFAMETHOXAZOLE-TRIMETHOPRIM 400-80 MG PO TABS: 1.0000 | ORAL_TABLET | Freq: Two times a day (BID) | ORAL | 0 refills | Status: DC

## 2021-01-30 MED ORDER — HYDROCODONE-ACETAMINOPHEN 5-325 MG PO TABS: 1.0000 | ORAL_TABLET | Freq: Four times a day (QID) | ORAL | 0 refills | Status: AC | PRN

## 2021-01-30 NOTE — Progress Notes (Signed)
Subjective: Luke Allen is a 86 y.o. male patient seen in office for evaluation of ulceration of the left foot.  Patient denies a history of diabetes and is assisted by daughter/power of attorney who helps to care for patient.  Patient's daughter reports that she is concerned because of the redness and swelling states that it was doing good last week but then this week slowly starting to get red and is concerned.  Patient also still admits that he has significant pain to the bottom of the heel pain wakes him up even at night.  Patient Active Problem List   Diagnosis Date Noted   Abdominal pain 10/01/2016   Age-related physical debility 05/03/2016   Cellulitis of left foot 05/03/2016   COPD exacerbation ([place]) 05/03/2016   Essential hypertension 05/03/2016   Renal insufficiency 05/03/2016   Tobacco abuse 09/05/2014   CVA (cerebral infarction) 08/29/2013   Central retinal artery occlusion of right eye 08/28/2013   Acid reflux    Anxiety    Sleep apnea    Hypertensive emergency 08/27/2013   Current Outpatient Medications on File Prior to Visit  Medication Sig Dispense Refill   allopurinol (ZYLOPRIM) 300 MG tablet Take 300 mg by mouth daily.   12   amLODipine (NORVASC) 10 MG tablet Take 10 mg by mouth daily.     cetirizine (ZYRTEC) 10 MG tablet Take 10 mg by mouth daily as needed for allergies.     divalproex (DEPAKOTE ER) 250 MG 24 hr tablet Take 250 mg by mouth daily.     furosemide (LASIX) 40 MG tablet Take 1 tablet by mouth daily.  0   lactulose (CHRONULAC) 10 GM/15ML solution Take 10 g by mouth 2 (two) times daily.     lisinopril-hydrochlorothiazide (PRINZIDE,ZESTORETIC) 20-12.5 MG tablet Take 1 tablet by mouth daily.     metFORMIN (GLUCOPHAGE) 500 MG tablet Take 500 mg by mouth 2 (two) times daily.     metoprolol (LOPRESSOR) 50 MG tablet Take 50 mg by mouth 2 (two) times daily.     pantoprazole (PROTONIX) 40 MG tablet Take 40 mg by mouth 2 (two) times daily.     SENNA-S 8.6-50 MG  tablet Take 2 tablets by mouth 2 (two) times daily.     tamsulosin (FLOMAX) 0.4 MG CAPS capsule Take 0.4 mg by mouth at bedtime.     venlafaxine XR (EFFEXOR-XR) 150 MG 24 hr capsule Take 300 mg by mouth daily.     vitamin C (ASCORBIC ACID) 500 MG tablet Take 500 mg by mouth daily.     No current facility-administered medications on file prior to visit.   Allergies  Allergen Reactions   Morphine And Related    Naproxen     Other reaction(s): FLUSHING   Other     Theraflu Cold and Cough ME   Tiotropium     Recent Results (from the past 2160 hour(s))  WOUND CULTURE     Status: None   Collection Time: 01/09/21 11:31 AM   Specimen: Foot, Left; Wound   Wound Culture and sens  Result Value Ref Range   Gram Stain Result Final report    Organism ID, Bacteria Comment     Comment: No white blood cells seen.   Organism ID, Bacteria Comment     Comment: Many gram positive cocci.   Organism ID, Bacteria Comment     Comment: Rare gram positive rods   Aerobic Bacterial Culture Final report    Organism ID, Bacteria Mixed skin flora  Comment: Heavy growth    Objective: There were no vitals filed for this visit.  General: Patient is awake, alert, oriented x 2 and in no acute distress except when his daughter is in the room he is very combative and is noted to be saying cuss words.  Dermatology: Skin is warm and dry bilateral with a partial thickness ulceration present Left plantar lateral heel and left second toe.  Previous third toe ulcer has resolved, the ulcerations measures 2 x 1.5 cm and the toe ulcerations measures less than 0.3 cm respectively. There is a keratotic border with a granular base and mild surrounding periwound maceration likely secondary to Band-Aid dressing to heel. The ulcerations do not probe to bone. There is no malodor, no active drainage, faint erythema, minimal edema.  No warmth.  No other acute signs of infection.   Vascular: Dorsalis Pedis pulse = 1/4  Bilateral,  Posterior Tibial pulse = 0/4 Bilateral,  Capillary Fill Time < 5 seconds  Neurologic: Gross sensation present via light touch bilateral.  Musculosketal: There is minimal pain with palpation to ulcerated areas. No pain with compression to calves bilateral. No gross bony deformities noted bilateral.  No results for input(s): GRAMSTAIN, LABORGA in the last 8760 hours.  Assessment and Plan:  Problem List Items Addressed This Visit       Musculoskeletal and Integument   Cellulitis of left foot   Other Visit Diagnoses     Ulcer of left foot, limited to breakdown of skin ([place])    -  Primary   Left foot pain       PVD (peripheral vascular disease) ([place])       Vascular dementia with behavior disturbance ([facility])            -Examined patient and discussed the progression of the wound and treatment alternatives. - Excisionally dedbrided ulceration at left heel and second toe to healthy bleeding borders removing nonviable tissue using a sterile chisel blade. Wound measures post debridement as above. Wound was debrided to the level of the dermis with viable wound base exposed to promote healing. Hemostasis was achieved with manuel pressure. Patient tolerated procedure well without any discomfort or anesthesia necessary for this wound debridement.  -For preventative measures prescribed Bactrim antibiotic for patient to take as directed -Applied Iodosorb and dry sterile Band-Aid dressing to left second toe and applied Prisma to the heel and instructed patient's daughter/power of attorney to continue with daily dressings at home consisting of the same -Prescribed hydrocodone this time only to help with pain -Ordered MRI however due to patient pacemaker facility unable to perform so change the order for an MRI to a CAT scan of the left heel to check for osteomyelitis if this is negative at next visit we will proceed with getting vascular studies - Advised patient to go to the ER or return to  office if the wound worsens or if constitutional symptoms are present. -Continue with cane to assist with gait and surgical shoe with heel cushion as dispensed this visit -Patient to return to office in 2 weeks for follow up wound care or sooner if problems arise.  Landis Martins, DPM

## 2021-01-31 ENCOUNTER — Telehealth: Payer: Self-pay | Admitting: Sports Medicine

## 2021-01-31 ENCOUNTER — Telehealth: Payer: Self-pay

## 2021-01-31 NOTE — Telephone Encounter (Signed)
  [facility] and scheduled pt's appt for CT scan for checking in at 2 for a 2:30 appt.  Contacted pt's daughter and advised him of his appt date and time, daughter states she can not do that date since she is a very busy women, so I provided her with [facility] phone number so she can reschedule

## 2021-01-31 NOTE — Telephone Encounter (Signed)
Void-no message needed

## 2021-02-04 DIAGNOSIS — M79672 Pain in left foot: Secondary | ICD-10-CM | POA: Diagnosis not present

## 2021-02-04 DIAGNOSIS — S91302A Unspecified open wound, left foot, initial encounter: Secondary | ICD-10-CM | POA: Diagnosis not present

## 2021-02-04 DIAGNOSIS — E119 Type 2 diabetes mellitus without complications: Secondary | ICD-10-CM | POA: Diagnosis not present

## 2021-02-04 DIAGNOSIS — L97429 Non-pressure chronic ulcer of left heel and midfoot with unspecified severity: Secondary | ICD-10-CM | POA: Diagnosis not present

## 2021-02-04 DIAGNOSIS — L03113 Cellulitis of right upper limb: Secondary | ICD-10-CM | POA: Diagnosis not present

## 2021-02-14 DIAGNOSIS — J441 Chronic obstructive pulmonary disease with (acute) exacerbation: Secondary | ICD-10-CM | POA: Diagnosis not present

## 2021-02-14 DIAGNOSIS — J9601 Acute respiratory failure with hypoxia: Secondary | ICD-10-CM | POA: Diagnosis not present

## 2021-02-15 ENCOUNTER — Encounter: Payer: Self-pay | Admitting: Sports Medicine

## 2021-02-15 ENCOUNTER — Ambulatory Visit (INDEPENDENT_AMBULATORY_CARE_PROVIDER_SITE_OTHER): Payer: Medicare Other | Admitting: Sports Medicine

## 2021-02-15 ENCOUNTER — Other Ambulatory Visit: Payer: Self-pay

## 2021-02-15 DIAGNOSIS — M79672 Pain in left foot: Secondary | ICD-10-CM

## 2021-02-15 DIAGNOSIS — F0151 Vascular dementia with behavioral disturbance: Secondary | ICD-10-CM

## 2021-02-15 DIAGNOSIS — I739 Peripheral vascular disease, unspecified: Secondary | ICD-10-CM

## 2021-02-15 DIAGNOSIS — B351 Tinea unguium: Secondary | ICD-10-CM

## 2021-02-15 DIAGNOSIS — M79674 Pain in right toe(s): Secondary | ICD-10-CM

## 2021-02-15 DIAGNOSIS — M79675 Pain in left toe(s): Secondary | ICD-10-CM

## 2021-02-15 DIAGNOSIS — L97521 Non-pressure chronic ulcer of other part of left foot limited to breakdown of skin: Secondary | ICD-10-CM

## 2021-02-15 DIAGNOSIS — F01518 Vascular dementia, unspecified severity, with other behavioral disturbance: Secondary | ICD-10-CM

## 2021-02-15 NOTE — Progress Notes (Signed)
Subjective: Luke Allen is a 86 y.o. male patient seen in office for evaluation of ulceration of the left foot.  Patient is assisted by daughter/power of attorney who helps to care for patient.  Patient's daughter reports that she feels like his wound is slowly getting better has not had to give him any pain medicine or antibiotics over the last several days he has stopped complaining of pain and she has not seen any signs of infection on the heel.  Patient is also here for discussion of CT results and also request nail trim at this visit.  Patient Active Problem List   Diagnosis Date Noted   Abdominal pain 10/01/2016   Age-related physical debility 05/03/2016   Cellulitis of left foot 05/03/2016   COPD exacerbation ([place]) 05/03/2016   Essential hypertension 05/03/2016   Renal insufficiency 05/03/2016   Tobacco abuse 09/05/2014   CVA (cerebral infarction) 08/29/2013   Central retinal artery occlusion of right eye 08/28/2013   Acid reflux    Anxiety    Sleep apnea    Hypertensive emergency 08/27/2013   Current Outpatient Medications on File Prior to Visit  Medication Sig Dispense Refill   allopurinol (ZYLOPRIM) 300 MG tablet Take 300 mg by mouth daily.   12   amLODipine (NORVASC) 10 MG tablet Take 10 mg by mouth daily.     cetirizine (ZYRTEC) 10 MG tablet Take 10 mg by mouth daily as needed for allergies.     divalproex (DEPAKOTE ER) 250 MG 24 hr tablet Take 250 mg by mouth daily.     furosemide (LASIX) 40 MG tablet Take 1 tablet by mouth daily.  0   lactulose (CHRONULAC) 10 GM/15ML solution Take 10 g by mouth 2 (two) times daily.     lisinopril-hydrochlorothiazide (PRINZIDE,ZESTORETIC) 20-12.5 MG tablet Take 1 tablet by mouth daily.     metFORMIN (GLUCOPHAGE) 500 MG tablet Take 500 mg by mouth 2 (two) times daily.     metoprolol (LOPRESSOR) 50 MG tablet Take 50 mg by mouth 2 (two) times daily.     pantoprazole (PROTONIX) 40 MG tablet Take 40 mg by mouth 2 (two) times daily.     SENNA-S  8.6-50 MG tablet Take 2 tablets by mouth 2 (two) times daily.     sulfamethoxazole-trimethoprim (BACTRIM) 400-80 MG tablet Take 1 tablet by mouth 2 (two) times daily. 28 tablet 0   tamsulosin (FLOMAX) 0.4 MG CAPS capsule Take 0.4 mg by mouth at bedtime.     venlafaxine XR (EFFEXOR-XR) 150 MG 24 hr capsule Take 300 mg by mouth daily.     vitamin C (ASCORBIC ACID) 500 MG tablet Take 500 mg by mouth daily.     No current facility-administered medications on file prior to visit.   Allergies  Allergen Reactions   Morphine And Related    Naproxen     Other reaction(s): FLUSHING   Other     Theraflu Cold and Cough ME   Tiotropium     Recent Results (from the past 2160 hour(s))  WOUND CULTURE     Status: None   Collection Time: 01/09/21 11:31 AM   Specimen: Foot, Left; Wound   Wound Culture and sens  Result Value Ref Range   Gram Stain Result Final report    Organism ID, Bacteria Comment     Comment: No white blood cells seen.   Organism ID, Bacteria Comment     Comment: Many gram positive cocci.   Organism ID, Bacteria Comment     Comment:  Rare gram positive rods   Aerobic Bacterial Culture Final report    Organism ID, Bacteria Mixed skin flora     Comment: Heavy growth    Objective: There were no vitals filed for this visit.  General: Patient is awake, alert, oriented x 2 and in no acute distress except when his daughter is in the room he is very combative and is noted to be saying cuss words.  Dermatology: Skin is warm and dry bilateral with a partial thickness ulceration present Left plantar heel that measures 1.1 x 1 cm and abrasions to the toes that are now scabbed over there is a keratotic border with a granular base and surrounding minimal keratosis the ulceration does not probe to bone. There is no malodor, no active drainage, no significant erythema, minimal edema.  No warmth.  No other acute signs of infection.   Nails x10 are thickened and elongated consistent with  onychomycosis  Vascular: Dorsalis Pedis pulse = 1/4 Bilateral,  Posterior Tibial pulse = 0/4 Bilateral,  Capillary Fill Time < 5 seconds  Neurologic: Gross sensation present via light touch bilateral.  Musculosketal: There is minimal pain with palpation to ulcerated area on the left heel. No pain with compression to calves bilateral. No gross bony deformities noted bilateral.  No results for input(s): GRAMSTAIN, LABORGA in the last 8760 hours.  Assessment and Plan:  Problem List Items Addressed This Visit   None Visit Diagnoses     Ulcer of left foot, limited to breakdown of skin ([place])    -  Primary   Left foot pain       PVD (peripheral vascular disease) ([place])       Vascular dementia with behavior disturbance ([place])       Pain due to onychomycosis of toenails of both feet           -Examined patient and discussed the progression of the wound and treatment alternatives. -Cleansed ulceration -Applied Iodosorb and a small amount of Prisma to the heel on the left cover with Mepitel border heel dressing -Advised daughter that abrasions at toes no longer need any type of medication continue to let the dry scabs form and slowly come off on their own -CT scan negative for osteomyelitis -Advised daughter at this time no need for additional antibiotics since heel is doing much better - Advised daughter if she has difficulty with providing appropriate wound care to let my office know so that we can order home nursing however at this time she feels like she can handle it -Mechanically debrided nails x10 using a sterile nail nipper without incident -Patient to return to office in 2 weeks for follow up wound care or sooner if problems arise.  Landis Martins, DPM

## 2021-03-01 ENCOUNTER — Encounter: Payer: Self-pay | Admitting: Sports Medicine

## 2021-03-01 ENCOUNTER — Other Ambulatory Visit: Payer: Self-pay

## 2021-03-01 ENCOUNTER — Ambulatory Visit (INDEPENDENT_AMBULATORY_CARE_PROVIDER_SITE_OTHER): Payer: Medicare Other | Admitting: Sports Medicine

## 2021-03-01 DIAGNOSIS — M79672 Pain in left foot: Secondary | ICD-10-CM

## 2021-03-01 DIAGNOSIS — L97521 Non-pressure chronic ulcer of other part of left foot limited to breakdown of skin: Secondary | ICD-10-CM

## 2021-03-01 DIAGNOSIS — I739 Peripheral vascular disease, unspecified: Secondary | ICD-10-CM

## 2021-03-01 DIAGNOSIS — F0151 Vascular dementia with behavioral disturbance: Secondary | ICD-10-CM

## 2021-03-01 DIAGNOSIS — F01518 Vascular dementia, unspecified severity, with other behavioral disturbance: Secondary | ICD-10-CM

## 2021-03-01 NOTE — Progress Notes (Signed)
Subjective: Luke Allen is a 86 y.o. male patient seen in office for evaluation of ulceration of the left foot.  Patient is assisted by daughter/power of attorney who helps to care for patient who states that wound is doing better she received the supplies from [facility] and has been doing dressings daily as indicated.  Reports that she forgot to put additional cushion in his shoe but otherwise he is doing better.  Patient Active Problem List   Diagnosis Date Noted   Abdominal pain 10/01/2016   Age-related physical debility 05/03/2016   Cellulitis of left foot 05/03/2016   COPD exacerbation ([place]) 05/03/2016   Essential hypertension 05/03/2016   Renal insufficiency 05/03/2016   Tobacco abuse 09/05/2014   CVA (cerebral infarction) 08/29/2013   Central retinal artery occlusion of right eye 08/28/2013   Acid reflux    Anxiety    Sleep apnea    Hypertensive emergency 08/27/2013   Current Outpatient Medications on File Prior to Visit  Medication Sig Dispense Refill   allopurinol (ZYLOPRIM) 300 MG tablet Take 300 mg by mouth daily.   12   amLODipine (NORVASC) 10 MG tablet Take 10 mg by mouth daily.     cetirizine (ZYRTEC) 10 MG tablet Take 10 mg by mouth daily as needed for allergies.     divalproex (DEPAKOTE ER) 250 MG 24 hr tablet Take 250 mg by mouth daily.     furosemide (LASIX) 40 MG tablet Take 1 tablet by mouth daily.  0   lactulose (CHRONULAC) 10 GM/15ML solution Take 10 g by mouth 2 (two) times daily.     lisinopril-hydrochlorothiazide (PRINZIDE,ZESTORETIC) 20-12.5 MG tablet Take 1 tablet by mouth daily.     metFORMIN (GLUCOPHAGE) 500 MG tablet Take 500 mg by mouth 2 (two) times daily.     metoprolol (LOPRESSOR) 50 MG tablet Take 50 mg by mouth 2 (two) times daily.     pantoprazole (PROTONIX) 40 MG tablet Take 40 mg by mouth 2 (two) times daily.     SENNA-S 8.6-50 MG tablet Take 2 tablets by mouth 2 (two) times daily.     sulfamethoxazole-trimethoprim (BACTRIM) 400-80 MG tablet  Take 1 tablet by mouth 2 (two) times daily. 28 tablet 0   tamsulosin (FLOMAX) 0.4 MG CAPS capsule Take 0.4 mg by mouth at bedtime.     venlafaxine XR (EFFEXOR-XR) 150 MG 24 hr capsule Take 300 mg by mouth daily.     vitamin C (ASCORBIC ACID) 500 MG tablet Take 500 mg by mouth daily.     No current facility-administered medications on file prior to visit.   Allergies  Allergen Reactions   Morphine And Related    Naproxen     Other reaction(s): FLUSHING   Other     Theraflu Cold and Cough ME   Tiotropium     Recent Results (from the past 2160 hour(s))  WOUND CULTURE     Status: None   Collection Time: 01/09/21 11:31 AM   Specimen: Foot, Left; Wound   Wound Culture and sens  Result Value Ref Range   Gram Stain Result Final report    Organism ID, Bacteria Comment     Comment: No white blood cells seen.   Organism ID, Bacteria Comment     Comment: Many gram positive cocci.   Organism ID, Bacteria Comment     Comment: Rare gram positive rods   Aerobic Bacterial Culture Final report    Organism ID, Bacteria Mixed skin flora     Comment: Heavy  growth    Objective: There were no vitals filed for this visit.  General: Patient is awake, alert, oriented x 2 and in no acute distress except when his daughter is in the room he is very combative and is noted to be saying cuss words.  Dermatology: Skin is warm and dry bilateral with a partial thickness ulceration present Left plantar heel that measures 1.1 x 1 cm and abrasions to the toes that are now scabbed over there is a keratotic border with a granular base and surrounding minimal keratosis the ulceration does not probe to bone. There is no malodor, no active drainage, no significant erythema, minimal edema.  No warmth.  No other acute signs of infection.   Nails x10 are thickened and short consistent with onychomycosis  Vascular: Dorsalis Pedis pulse = 1/4 Bilateral,  Posterior Tibial pulse = 0/4 Bilateral,  Capillary Fill Time < 5  seconds  Neurologic: Gross sensation present via light touch bilateral.  Musculosketal: There is minimal pain with palpation to ulcerated area on the left heel. No pain with compression to calves bilateral. No gross bony deformities noted bilateral.  No results for input(s): GRAMSTAIN, LABORGA in the last 8760 hours.  Assessment and Plan:  Problem List Items Addressed This Visit   None Visit Diagnoses     Ulcer of left foot, limited to breakdown of skin ([place])    -  Primary   Left foot pain       PVD (peripheral vascular disease) ([place])       Vascular dementia with behavior disturbance ([place])           -Examined patient and discussed the progression of the wound and treatment alternatives. -Cleansed ulceration -Mechanically debrided wound using a sterile tissue nipper to healthy bleeding margins bleeding was controlled with manual pressure patient tolerated debridement well without need for anesthesia -Applied Iodosorb and a small amount of Maxorb to the heel on the left cover with Mepitel border heel dressing -Advised daughter that abrasions at toes no longer need any type of medication continue to let the dry scabs form and slowly come off on their own like previous - Advised daughter if she has difficulty with providing appropriate wound care to let my office know so that we can order home nursing however at this time she feels like she can handle it again this visit -Patient to return to office in 2 weeks for follow up wound care or sooner if problems arise.  Landis Martins, DPM

## 2021-03-17 DIAGNOSIS — J9601 Acute respiratory failure with hypoxia: Secondary | ICD-10-CM | POA: Diagnosis not present

## 2021-03-17 DIAGNOSIS — J441 Chronic obstructive pulmonary disease with (acute) exacerbation: Secondary | ICD-10-CM | POA: Diagnosis not present

## 2021-03-19 ENCOUNTER — Ambulatory Visit: Payer: TRICARE For Life (TFL) | Admitting: Sports Medicine

## 2021-03-27 ENCOUNTER — Ambulatory Visit: Payer: Medicare Other | Admitting: Sports Medicine

## 2021-03-28 DIAGNOSIS — L97521 Non-pressure chronic ulcer of other part of left foot limited to breakdown of skin: Secondary | ICD-10-CM | POA: Diagnosis not present

## 2021-04-05 ENCOUNTER — Other Ambulatory Visit: Payer: Self-pay

## 2021-04-05 ENCOUNTER — Ambulatory Visit (INDEPENDENT_AMBULATORY_CARE_PROVIDER_SITE_OTHER): Payer: Medicare Other | Admitting: Sports Medicine

## 2021-04-05 ENCOUNTER — Encounter: Payer: Self-pay | Admitting: Sports Medicine

## 2021-04-05 DIAGNOSIS — M79672 Pain in left foot: Secondary | ICD-10-CM | POA: Diagnosis not present

## 2021-04-05 DIAGNOSIS — F0151 Vascular dementia with behavioral disturbance: Secondary | ICD-10-CM | POA: Diagnosis not present

## 2021-04-05 DIAGNOSIS — L97521 Non-pressure chronic ulcer of other part of left foot limited to breakdown of skin: Secondary | ICD-10-CM

## 2021-04-05 DIAGNOSIS — I739 Peripheral vascular disease, unspecified: Secondary | ICD-10-CM | POA: Diagnosis not present

## 2021-04-05 DIAGNOSIS — F01518 Vascular dementia, unspecified severity, with other behavioral disturbance: Secondary | ICD-10-CM

## 2021-04-05 NOTE — Progress Notes (Signed)
Subjective: Luke Allen is a 86 y.o. male patient seen in office for evaluation of ulceration of the left foot.  Patient is assisted by daughter/power of attorney who helps to care for patient who states wound is doing good about healed.  Patient denies any other pedal complaints or concerns at this time.  States that he missed last visit due to diarrhea.  Denies any other constitutional symptoms.  Patient Active Problem List   Diagnosis Date Noted   Abdominal pain 10/01/2016   Age-related physical debility 05/03/2016   Cellulitis of left foot 05/03/2016   COPD exacerbation ([place]) 05/03/2016   Essential hypertension 05/03/2016   Renal insufficiency 05/03/2016   Tobacco abuse 09/05/2014   CVA (cerebral infarction) 08/29/2013   Central retinal artery occlusion of right eye 08/28/2013   Acid reflux    Anxiety    Sleep apnea    Hypertensive emergency 08/27/2013   Current Outpatient Medications on File Prior to Visit  Medication Sig Dispense Refill   allopurinol (ZYLOPRIM) 300 MG tablet Take 300 mg by mouth daily.   12   amLODipine (NORVASC) 10 MG tablet Take 10 mg by mouth daily.     cetirizine (ZYRTEC) 10 MG tablet Take 10 mg by mouth daily as needed for allergies.     divalproex (DEPAKOTE ER) 250 MG 24 hr tablet Take 250 mg by mouth daily.     furosemide (LASIX) 40 MG tablet Take 1 tablet by mouth daily.  0   lactulose (CHRONULAC) 10 GM/15ML solution Take 10 g by mouth 2 (two) times daily.     lisinopril-hydrochlorothiazide (PRINZIDE,ZESTORETIC) 20-12.5 MG tablet Take 1 tablet by mouth daily.     metFORMIN (GLUCOPHAGE) 500 MG tablet Take 500 mg by mouth 2 (two) times daily.     metoprolol (LOPRESSOR) 50 MG tablet Take 50 mg by mouth 2 (two) times daily.     pantoprazole (PROTONIX) 40 MG tablet Take 40 mg by mouth 2 (two) times daily.     SENNA-S 8.6-50 MG tablet Take 2 tablets by mouth 2 (two) times daily.     sulfamethoxazole-trimethoprim (BACTRIM) 400-80 MG tablet Take 1 tablet by mouth  2 (two) times daily. 28 tablet 0   tamsulosin (FLOMAX) 0.4 MG CAPS capsule Take 0.4 mg by mouth at bedtime.     venlafaxine XR (EFFEXOR-XR) 150 MG 24 hr capsule Take 300 mg by mouth daily.     vitamin C (ASCORBIC ACID) 500 MG tablet Take 500 mg by mouth daily.     No current facility-administered medications on file prior to visit.   Allergies  Allergen Reactions   Morphine And Related    Naproxen     Other reaction(s): FLUSHING   Other     Theraflu Cold and Cough ME   Tiotropium     Recent Results (from the past 2160 hour(s))  WOUND CULTURE     Status: None   Collection Time: 01/09/21 11:31 AM   Specimen: Foot, Left; Wound   Wound Culture and sens  Result Value Ref Range   Gram Stain Result Final report    Organism ID, Bacteria Comment     Comment: No white blood cells seen.   Organism ID, Bacteria Comment     Comment: Many gram positive cocci.   Organism ID, Bacteria Comment     Comment: Rare gram positive rods   Aerobic Bacterial Culture Final report    Organism ID, Bacteria Mixed skin flora     Comment: Heavy growth  Objective: There were no vitals filed for this visit.  General: Patient is awake, alert, oriented x 2 and in no acute distress except when his daughter is in the room he is very combative and is noted to be saying cuss words.  Dermatology: Skin is warm and dry bilateral with a partial thickness ulceration present Left plantar heel that is prematurely closed last measurement was 1.1 x 1 but now it is 0.1 with a small scab over the area with no evidence of granular base exposed, the ulceration does not probe to bone. There is no malodor, no active drainage, no significant erythema, minimal edema.  No warmth.  No other acute signs of infection.   Nails x10 are thickened and short consistent with onychomycosis  Vascular: Dorsalis Pedis pulse = 1/4 Bilateral,  Posterior Tibial pulse = 0/4 Bilateral,  Capillary Fill Time < 5 seconds  Neurologic: Gross  sensation present via light touch bilateral.  Musculosketal: There is minimal pain with palpation to prematurely healed ulcerated area on the left heel. No pain with compression to calves bilateral. No gross bony deformities noted bilateral.  No results for input(s): GRAMSTAIN, LABORGA in the last 8760 hours.  Assessment and Plan:  Problem List Items Addressed This Visit   None Visit Diagnoses     Ulcer of left foot, limited to breakdown of skin ([place])    -  Primary   Left foot pain       PVD (peripheral vascular disease) ([place])       Vascular dementia with behavior disturbance ([place])           -Examined patient and discussed the progression of the wound and treatment alternatives. -Cleansed ulceration -No debridement necessary at this time due to premature healing nature of wound to the left heel  -Applied Iodosorb covered with Mepitel border heel dressing -Advised daughter that patient may transition to a normal shoe next week -Advised daughter to place offloading padding and normal shoe on next week -Patient to return to office if wound fails to continue to improve and as scheduled for nail care in 8 weeks or sooner if problems arise.  Landis Martins, DPM

## 2021-04-08 DIAGNOSIS — L97521 Non-pressure chronic ulcer of other part of left foot limited to breakdown of skin: Secondary | ICD-10-CM | POA: Diagnosis not present

## 2021-04-16 DIAGNOSIS — J441 Chronic obstructive pulmonary disease with (acute) exacerbation: Secondary | ICD-10-CM | POA: Diagnosis not present

## 2021-04-16 DIAGNOSIS — J9601 Acute respiratory failure with hypoxia: Secondary | ICD-10-CM | POA: Diagnosis not present

## 2021-05-17 DIAGNOSIS — J9601 Acute respiratory failure with hypoxia: Secondary | ICD-10-CM | POA: Diagnosis not present

## 2021-05-17 DIAGNOSIS — J441 Chronic obstructive pulmonary disease with (acute) exacerbation: Secondary | ICD-10-CM | POA: Diagnosis not present

## 2021-05-31 ENCOUNTER — Ambulatory Visit: Payer: Medicare Other | Admitting: Sports Medicine

## 2021-06-16 DIAGNOSIS — J9601 Acute respiratory failure with hypoxia: Secondary | ICD-10-CM | POA: Diagnosis not present

## 2021-06-16 DIAGNOSIS — J441 Chronic obstructive pulmonary disease with (acute) exacerbation: Secondary | ICD-10-CM | POA: Diagnosis not present

## 2021-06-28 ENCOUNTER — Encounter: Payer: Self-pay | Admitting: Sports Medicine

## 2021-06-28 ENCOUNTER — Ambulatory Visit (INDEPENDENT_AMBULATORY_CARE_PROVIDER_SITE_OTHER): Payer: Medicare Other | Admitting: Sports Medicine

## 2021-06-28 DIAGNOSIS — R39198 Other difficulties with micturition: Secondary | ICD-10-CM | POA: Insufficient documentation

## 2021-06-28 DIAGNOSIS — R296 Repeated falls: Secondary | ICD-10-CM | POA: Insufficient documentation

## 2021-06-28 DIAGNOSIS — G9341 Metabolic encephalopathy: Secondary | ICD-10-CM | POA: Insufficient documentation

## 2021-06-28 DIAGNOSIS — R451 Restlessness and agitation: Secondary | ICD-10-CM | POA: Insufficient documentation

## 2021-06-28 DIAGNOSIS — J189 Pneumonia, unspecified organism: Secondary | ICD-10-CM | POA: Insufficient documentation

## 2021-06-28 DIAGNOSIS — R112 Nausea with vomiting, unspecified: Secondary | ICD-10-CM | POA: Insufficient documentation

## 2021-06-28 DIAGNOSIS — Z461 Encounter for fitting and adjustment of hearing aid: Secondary | ICD-10-CM | POA: Insufficient documentation

## 2021-06-28 DIAGNOSIS — T847XXA Infection and inflammatory reaction due to other internal orthopedic prosthetic devices, implants and grafts, initial encounter: Secondary | ICD-10-CM | POA: Insufficient documentation

## 2021-06-28 DIAGNOSIS — K4090 Unilateral inguinal hernia, without obstruction or gangrene, not specified as recurrent: Secondary | ICD-10-CM | POA: Insufficient documentation

## 2021-06-28 DIAGNOSIS — F05 Delirium due to known physiological condition: Secondary | ICD-10-CM | POA: Insufficient documentation

## 2021-06-28 DIAGNOSIS — I2510 Atherosclerotic heart disease of native coronary artery without angina pectoris: Secondary | ICD-10-CM | POA: Insufficient documentation

## 2021-06-28 DIAGNOSIS — R350 Frequency of micturition: Secondary | ICD-10-CM | POA: Insufficient documentation

## 2021-06-28 DIAGNOSIS — J156 Pneumonia due to other aerobic Gram-negative bacteria: Secondary | ICD-10-CM | POA: Insufficient documentation

## 2021-06-28 DIAGNOSIS — S32030A Wedge compression fracture of third lumbar vertebra, initial encounter for closed fracture: Secondary | ICD-10-CM | POA: Insufficient documentation

## 2021-06-28 DIAGNOSIS — B351 Tinea unguium: Secondary | ICD-10-CM | POA: Diagnosis not present

## 2021-06-28 DIAGNOSIS — M79675 Pain in left toe(s): Secondary | ICD-10-CM

## 2021-06-28 DIAGNOSIS — F528 Other sexual dysfunction not due to a substance or known physiological condition: Secondary | ICD-10-CM | POA: Insufficient documentation

## 2021-06-28 DIAGNOSIS — L570 Actinic keratosis: Secondary | ICD-10-CM | POA: Insufficient documentation

## 2021-06-28 DIAGNOSIS — R001 Bradycardia, unspecified: Secondary | ICD-10-CM | POA: Insufficient documentation

## 2021-06-28 DIAGNOSIS — M545 Low back pain, unspecified: Secondary | ICD-10-CM | POA: Insufficient documentation

## 2021-06-28 DIAGNOSIS — J9621 Acute and chronic respiratory failure with hypoxia: Secondary | ICD-10-CM | POA: Insufficient documentation

## 2021-06-28 DIAGNOSIS — F01518 Vascular dementia, unspecified severity, with other behavioral disturbance: Secondary | ICD-10-CM

## 2021-06-28 DIAGNOSIS — Z7189 Other specified counseling: Secondary | ICD-10-CM | POA: Insufficient documentation

## 2021-06-28 DIAGNOSIS — M25519 Pain in unspecified shoulder: Secondary | ICD-10-CM | POA: Insufficient documentation

## 2021-06-28 DIAGNOSIS — J1569 Pneumonia due to other gram-negative bacteria: Secondary | ICD-10-CM | POA: Insufficient documentation

## 2021-06-28 DIAGNOSIS — E860 Dehydration: Secondary | ICD-10-CM | POA: Insufficient documentation

## 2021-06-28 DIAGNOSIS — M79674 Pain in right toe(s): Secondary | ICD-10-CM | POA: Diagnosis not present

## 2021-06-28 DIAGNOSIS — J9690 Respiratory failure, unspecified, unspecified whether with hypoxia or hypercapnia: Secondary | ICD-10-CM | POA: Insufficient documentation

## 2021-06-28 DIAGNOSIS — Z9861 Coronary angioplasty status: Secondary | ICD-10-CM | POA: Insufficient documentation

## 2021-06-28 DIAGNOSIS — I714 Abdominal aortic aneurysm, without rupture, unspecified: Secondary | ICD-10-CM | POA: Insufficient documentation

## 2021-06-28 DIAGNOSIS — N400 Enlarged prostate without lower urinary tract symptoms: Secondary | ICD-10-CM | POA: Insufficient documentation

## 2021-06-28 DIAGNOSIS — M542 Cervicalgia: Secondary | ICD-10-CM | POA: Insufficient documentation

## 2021-06-28 DIAGNOSIS — E669 Obesity, unspecified: Secondary | ICD-10-CM | POA: Insufficient documentation

## 2021-06-28 DIAGNOSIS — R1310 Dysphagia, unspecified: Secondary | ICD-10-CM | POA: Insufficient documentation

## 2021-06-28 DIAGNOSIS — E119 Type 2 diabetes mellitus without complications: Secondary | ICD-10-CM | POA: Insufficient documentation

## 2021-06-28 DIAGNOSIS — C61 Malignant neoplasm of prostate: Secondary | ICD-10-CM | POA: Insufficient documentation

## 2021-06-28 DIAGNOSIS — J309 Allergic rhinitis, unspecified: Secondary | ICD-10-CM | POA: Insufficient documentation

## 2021-06-28 DIAGNOSIS — J101 Influenza due to other identified influenza virus with other respiratory manifestations: Secondary | ICD-10-CM | POA: Insufficient documentation

## 2021-06-28 DIAGNOSIS — Z741 Need for assistance with personal care: Secondary | ICD-10-CM | POA: Insufficient documentation

## 2021-06-28 DIAGNOSIS — F03918 Unspecified dementia, unspecified severity, with other behavioral disturbance: Secondary | ICD-10-CM | POA: Insufficient documentation

## 2021-06-28 DIAGNOSIS — Z639 Problem related to primary support group, unspecified: Secondary | ICD-10-CM | POA: Insufficient documentation

## 2021-06-28 DIAGNOSIS — R262 Difficulty in walking, not elsewhere classified: Secondary | ICD-10-CM | POA: Insufficient documentation

## 2021-06-28 DIAGNOSIS — H90A32 Mixed conductive and sensorineural hearing loss, unilateral, left ear with restricted hearing on the contralateral side: Secondary | ICD-10-CM | POA: Insufficient documentation

## 2021-06-28 DIAGNOSIS — F17200 Nicotine dependence, unspecified, uncomplicated: Secondary | ICD-10-CM | POA: Insufficient documentation

## 2021-06-28 DIAGNOSIS — R748 Abnormal levels of other serum enzymes: Secondary | ICD-10-CM | POA: Insufficient documentation

## 2021-06-28 DIAGNOSIS — I739 Peripheral vascular disease, unspecified: Secondary | ICD-10-CM

## 2021-06-28 DIAGNOSIS — E11621 Type 2 diabetes mellitus with foot ulcer: Secondary | ICD-10-CM | POA: Insufficient documentation

## 2021-06-28 DIAGNOSIS — R1313 Dysphagia, pharyngeal phase: Secondary | ICD-10-CM | POA: Insufficient documentation

## 2021-06-28 DIAGNOSIS — Z638 Other specified problems related to primary support group: Secondary | ICD-10-CM | POA: Insufficient documentation

## 2021-06-28 DIAGNOSIS — E876 Hypokalemia: Secondary | ICD-10-CM | POA: Insufficient documentation

## 2021-06-28 DIAGNOSIS — G589 Mononeuropathy, unspecified: Secondary | ICD-10-CM | POA: Insufficient documentation

## 2021-06-28 DIAGNOSIS — M1610 Unilateral primary osteoarthritis, unspecified hip: Secondary | ICD-10-CM | POA: Insufficient documentation

## 2021-06-28 DIAGNOSIS — N179 Acute kidney failure, unspecified: Secondary | ICD-10-CM | POA: Insufficient documentation

## 2021-06-28 DIAGNOSIS — IMO0002 Reserved for concepts with insufficient information to code with codable children: Secondary | ICD-10-CM | POA: Insufficient documentation

## 2021-06-28 DIAGNOSIS — R197 Diarrhea, unspecified: Secondary | ICD-10-CM | POA: Insufficient documentation

## 2021-06-28 DIAGNOSIS — R2689 Other abnormalities of gait and mobility: Secondary | ICD-10-CM | POA: Insufficient documentation

## 2021-06-28 NOTE — Progress Notes (Signed)
Subjective: Luke Allen is a 86 y.o. male patient seen today in office with complaint of mildly painful thickened and elongated toenails; unable to trim. Patient is assisted by daughter who denies any other pedal complaints or concerns at this time.  Previous left heel ulcer remains healed.  Patient Active Problem List   Diagnosis Date Noted   Abdominal aortic aneurysm without rupture 06/28/2021   Actinic keratosis 06/28/2021   Acute and chronic respiratory failure with hypoxia ([place]) 06/28/2021   Acute kidney injury superimposed on chronic kidney disease ([place]) 06/28/2021   Agitation 06/28/2021   Allergic rhinitis 06/28/2021   Arthropathy of pelvic region and thigh 06/28/2021   Bradycardia 06/28/2021   CAD S/P percutaneous coronary angioplasty 06/28/2021   Cervicalgia 06/28/2021   Compression fracture of L3 vertebra ([place]) 06/28/2021   Coronary arteriosclerosis 06/28/2021   Other specified counseling 06/28/2021   Degeneration of intervertebral disc 06/28/2021   Delirium due to known physiological condition 06/28/2021   Dementia with behavioral disturbance 06/28/2021   Diabetic foot ulcer ([place]) 06/28/2021   Diarrhea 06/28/2021   Difficulty in walking, not elsewhere classified 06/28/2021   Dysphagia, pharyngeal phase 06/28/2021   Dysphagia, unspecified 06/28/2021   Elevated alkaline phosphatase level 06/28/2021   Encounter for fitting and adjustment of hearing aid 06/28/2021   Family circumstance 06/28/2021   Gram-negative pneumonia ([place]) 06/28/2021   Hypertrophy of prostate without urinary obstruction and other lower urinary tract symptoms (LUTS) 06/28/2021   Hypopotassemia 06/28/2021   Infection and inflammatory reaction due to other internal orthopedic prosthetic devices, implants and grafts, initial encounter ([place]) 06/28/2021   Influenza B 06/28/2021   Inguinal hernia 06/28/2021   Low back pain 06/28/2021   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   Malignant neoplasm of prostate ([place])  29/52/8413   Metabolic encephalopathy 24/40/1027   Mixed conductive and sensorineural hearing loss, unilateral, left ear with restricted hearing on the contralateral side 06/28/2021   Mononeuritis 06/28/2021   Multiple falls 06/28/2021   Nausea and vomiting 06/28/2021   Need for assistance with personal care 06/28/2021   Nicotine dependence 06/28/2021   Obesity 06/28/2021   Other abnormalities of gait and mobility 06/28/2021   Other interpersonal problem 06/28/2021   Pain in joint, shoulder region 06/28/2021   Psychosexual dysfunction with inhibited sexual excitement 06/28/2021   Respiratory failure ([place]) 06/28/2021   Right lower lobe pneumonia 06/28/2021   Slowing of urinary stream 06/28/2021   Type 2 diabetes mellitus without complications ([place]) 25/36/6440   Urinary frequency 06/28/2021   Abdominal pain 10/01/2016   Age-related physical debility 05/03/2016   Cellulitis of left foot 05/03/2016   COPD exacerbation ([place]) 05/03/2016   Essential hypertension 05/03/2016   Renal insufficiency 05/03/2016   Tobacco abuse 09/05/2014   CVA (cerebral infarction) 08/29/2013   Central retinal artery occlusion of right eye 08/28/2013   Acid reflux    Anxiety    Sleep apnea    Hypertensive emergency 08/27/2013   Enthesopathy 03/03/2000   Antisocial personality disorder ([place]) 02/17/1992   Cataplexy and narcolepsy 02/17/1992    Current Outpatient Medications on File Prior to Visit  Medication Sig Dispense Refill   acetaminophen (TYLENOL) 325 MG tablet 3 tablets.     allopurinol (ZYLOPRIM) 300 MG tablet Take 1 tablet by mouth daily.     aspirin 81 MG chewable tablet CHEW ONE TABLET BY MOUTH EVERY DAY     budesonide-formoterol (SYMBICORT) 160-4.5 MCG/ACT inhaler INHALE 2 PUFFS BY MOUTH TWICE A DAY (RINSE MOUTH WELL WITH WATER AFTER EACH USE)  cephALEXin (KEFLEX) 500 MG capsule 500 mg.     divalproex (DEPAKOTE ER) 250 MG 24 hr tablet Take 1 tablet by mouth at bedtime.     doxycycline  (MONODOX) 100 MG capsule      glipiZIDE (GLUCOTROL) 5 MG tablet 5 mg.     HYDROcodone-acetaminophen (NORCO/VICODIN) 5-325 MG tablet      ipratropium-albuterol (DUONEB) 0.5-2.5 (3) MG/3ML SOLN INHALE 1 VIAL IN NEBULIZER BY MOUTH FOUR TIMES A DAY     Magnesium Hydroxide (MILK OF MAGNESIA PO) TAKE 2 TABLESPOONFULS BY MOUTH ONCE A DAY AS NEEDED     Magnesium Oxide 420 MG TABS Take 1 tablet by mouth daily.     metFORMIN (GLUCOPHAGE) 500 MG tablet Take 1 tablet by mouth 2 (two) times daily.     QUEtiapine (SEROQUEL) 25 MG tablet 25 mg.     vitamin B-12 (CYANOCOBALAMIN) 500 MCG tablet TAKE FOUR TABLETS BY MOUTH ONCE A DAY     acetaminophen-codeine (TYLENOL #3) 300-30 MG tablet      amLODipine (NORVASC) 10 MG tablet Take 10 mg by mouth daily.     cetirizine (ZYRTEC) 10 MG tablet Take 10 mg by mouth daily as needed for allergies.     furosemide (LASIX) 40 MG tablet Take 1 tablet by mouth daily.  0   lactulose (CHRONULAC) 10 GM/15ML solution Take 10 g by mouth 2 (two) times daily.     lisinopril-hydrochlorothiazide (PRINZIDE,ZESTORETIC) 20-12.5 MG tablet Take 1 tablet by mouth daily.     metFORMIN (GLUCOPHAGE) 500 MG tablet Take 500 mg by mouth 2 (two) times daily.     metoprolol (LOPRESSOR) 50 MG tablet Take 50 mg by mouth 2 (two) times daily.     pantoprazole (PROTONIX) 40 MG tablet Take 40 mg by mouth 2 (two) times daily.     SENNA-S 8.6-50 MG tablet Take 2 tablets by mouth 2 (two) times daily.     sulfamethoxazole-trimethoprim (BACTRIM) 400-80 MG tablet Take 1 tablet by mouth 2 (two) times daily. 28 tablet 0   tamsulosin (FLOMAX) 0.4 MG CAPS capsule Take 0.4 mg by mouth at bedtime.     venlafaxine XR (EFFEXOR-XR) 150 MG 24 hr capsule Take 300 mg by mouth daily.     vitamin C (ASCORBIC ACID) 500 MG tablet Take 500 mg by mouth daily.     No current facility-administered medications on file prior to visit.    Allergies  Allergen Reactions   Morphine And Related    Naproxen     Other  reaction(s): FLUSHING   Other     Theraflu Cold and Cough ME   Tiotropium     Objective: Physical Exam  General: Well developed, nourished, no acute distress, awake, alert and oriented x 2  Dermatology: Left heel wound is now healed.  Nails x10 are thickened and short consistent with onychomycosis   Vascular: Dorsalis Pedis pulse = 1/4 Bilateral,  Posterior Tibial pulse = 0/4 Bilateral,  Capillary Fill Time < 5 seconds   Neurologic: Gross sensation present via light touch bilateral.   Musculosketal: There is no pain to palpation bilateral.  No pain with compression to calves bilateral. No gross bony deformities noted bilateral.   Assessment and Plan:  Problem List Items Addressed This Visit   None Visit Diagnoses     Pain due to onychomycosis of toenails of both feet    -  Primary   Relevant Medications   cephALEXin (KEFLEX) 500 MG capsule   PVD (peripheral vascular disease) ([address] [address])  Relevant Medications   aspirin 81 MG chewable tablet   Vascular dementia with behavior disturbance       Relevant Medications   divalproex (DEPAKOTE ER) 250 MG 24 hr tablet   QUEtiapine (SEROQUEL) 25 MG tablet       -Examined patient.  -Discussed treatment options for painful mycotic nails. -Mechanically debrided and reduced mycotic nails with sterile nail nipper and dremel nail file without incident. -Advised daughter to try to get dad new shoes since his current shoes are very worn and continue with heel cushion on the left -Patient to return in 3 months for follow up evaluation or sooner if symptoms worsen.  Landis Martins, DPM

## 2021-07-17 DIAGNOSIS — J9601 Acute respiratory failure with hypoxia: Secondary | ICD-10-CM | POA: Diagnosis not present

## 2021-07-17 DIAGNOSIS — J441 Chronic obstructive pulmonary disease with (acute) exacerbation: Secondary | ICD-10-CM | POA: Diagnosis not present

## 2021-08-15 DIAGNOSIS — R531 Weakness: Secondary | ICD-10-CM | POA: Diagnosis not present

## 2021-08-15 DIAGNOSIS — I7300 Raynaud's syndrome without gangrene: Secondary | ICD-10-CM | POA: Diagnosis not present

## 2021-08-17 DIAGNOSIS — J9601 Acute respiratory failure with hypoxia: Secondary | ICD-10-CM | POA: Diagnosis not present

## 2021-08-17 DIAGNOSIS — J441 Chronic obstructive pulmonary disease with (acute) exacerbation: Secondary | ICD-10-CM | POA: Diagnosis not present

## 2021-08-29 DIAGNOSIS — Z9181 History of falling: Secondary | ICD-10-CM | POA: Diagnosis not present

## 2021-08-29 DIAGNOSIS — F17200 Nicotine dependence, unspecified, uncomplicated: Secondary | ICD-10-CM | POA: Diagnosis not present

## 2021-08-29 DIAGNOSIS — R262 Difficulty in walking, not elsewhere classified: Secondary | ICD-10-CM | POA: Diagnosis not present

## 2021-08-29 DIAGNOSIS — I10 Essential (primary) hypertension: Secondary | ICD-10-CM | POA: Diagnosis not present

## 2021-08-29 DIAGNOSIS — E1165 Type 2 diabetes mellitus with hyperglycemia: Secondary | ICD-10-CM | POA: Diagnosis not present

## 2021-08-29 DIAGNOSIS — J449 Chronic obstructive pulmonary disease, unspecified: Secondary | ICD-10-CM | POA: Diagnosis not present

## 2021-08-29 DIAGNOSIS — M1990 Unspecified osteoarthritis, unspecified site: Secondary | ICD-10-CM | POA: Diagnosis not present

## 2021-08-29 DIAGNOSIS — Z9981 Dependence on supplemental oxygen: Secondary | ICD-10-CM | POA: Diagnosis not present

## 2021-08-29 DIAGNOSIS — I5032 Chronic diastolic (congestive) heart failure: Secondary | ICD-10-CM | POA: Diagnosis not present

## 2021-08-29 DIAGNOSIS — J9611 Chronic respiratory failure with hypoxia: Secondary | ICD-10-CM | POA: Diagnosis not present

## 2021-09-14 DIAGNOSIS — J9601 Acute respiratory failure with hypoxia: Secondary | ICD-10-CM | POA: Diagnosis not present

## 2021-09-14 DIAGNOSIS — J441 Chronic obstructive pulmonary disease with (acute) exacerbation: Secondary | ICD-10-CM | POA: Diagnosis not present

## 2021-09-27 ENCOUNTER — Other Ambulatory Visit: Payer: Self-pay

## 2021-09-27 ENCOUNTER — Ambulatory Visit (INDEPENDENT_AMBULATORY_CARE_PROVIDER_SITE_OTHER): Payer: Medicare Other | Admitting: Sports Medicine

## 2021-09-27 ENCOUNTER — Encounter: Payer: Self-pay | Admitting: Sports Medicine

## 2021-09-27 DIAGNOSIS — M79675 Pain in left toe(s): Secondary | ICD-10-CM | POA: Diagnosis not present

## 2021-09-27 DIAGNOSIS — I739 Peripheral vascular disease, unspecified: Secondary | ICD-10-CM

## 2021-09-27 DIAGNOSIS — F01518 Vascular dementia, unspecified severity, with other behavioral disturbance: Secondary | ICD-10-CM

## 2021-09-27 DIAGNOSIS — B351 Tinea unguium: Secondary | ICD-10-CM | POA: Diagnosis not present

## 2021-09-27 DIAGNOSIS — M79674 Pain in right toe(s): Secondary | ICD-10-CM

## 2021-09-27 NOTE — Progress Notes (Signed)
Subjective: ?Luke Allen is a 87 y.o. male patient seen today in office with complaint of mildly painful thickened and elongated toenails; unable to trim. Patient is assisted by daughter this visit, who reports that he did not want to get out of bed she had to wake him up to come to his appointment today.  No other issues noted. ? ?Patient Active Problem List  ? Diagnosis Date Noted  ? Abdominal aortic aneurysm without rupture 06/28/2021  ? Actinic keratosis 06/28/2021  ? Acute and chronic respiratory failure with hypoxia ([place]) 06/28/2021  ? Acute kidney injury superimposed on chronic kidney disease ([place]) 06/28/2021  ? Agitation 06/28/2021  ? Allergic rhinitis 06/28/2021  ? Arthropathy of pelvic region and thigh 06/28/2021  ? Bradycardia 06/28/2021  ? CAD S/P percutaneous coronary angioplasty 06/28/2021  ? Cervicalgia 06/28/2021  ? Compression fracture of L3 vertebra ([place]) 06/28/2021  ? Coronary arteriosclerosis 06/28/2021  ? Other specified counseling 06/28/2021  ? Degeneration of intervertebral disc 06/28/2021  ? Delirium due to known physiological condition 06/28/2021  ? Dementia with behavioral disturbance 06/28/2021  ? Diabetic foot ulcer ([place]) 06/28/2021  ? Diarrhea 06/28/2021  ? Difficulty in walking, not elsewhere classified 06/28/2021  ? Dysphagia, pharyngeal phase 06/28/2021  ? Dysphagia, unspecified 06/28/2021  ? Elevated alkaline phosphatase level 06/28/2021  ? Encounter for fitting and adjustment of hearing aid 06/28/2021  ? Family circumstance 06/28/2021  ? Gram-negative pneumonia ([place]) 06/28/2021  ? Hypertrophy of prostate without urinary obstruction and other lower urinary tract symptoms (LUTS) 06/28/2021  ? Hypopotassemia 06/28/2021  ? Infection and inflammatory reaction due to other internal orthopedic prosthetic devices, implants and grafts, initial encounter ([place]) 06/28/2021  ? Influenza B 06/28/2021  ? Inguinal hernia 06/28/2021  ? Low back pain 06/28/2021  ? 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  Malignant neoplasm of prostate ([place]) 06/28/2021  ? Metabolic encephalopathy 40/97/3532  ? Mixed conductive and sensorineural hearing loss, unilateral, left ear with restricted hearing on the contralateral side 06/28/2021  ? Mononeuritis 06/28/2021  ? Multiple falls 06/28/2021  ? Nausea and vomiting 06/28/2021  ? Need for assistance with personal care 06/28/2021  ? Nicotine dependence 06/28/2021  ? Obesity 06/28/2021  ? Other abnormalities of gait and mobility 06/28/2021  ? Other interpersonal problem 06/28/2021  ? Pain in joint, shoulder region 06/28/2021  ? Psychosexual dysfunction with inhibited sexual excitement 06/28/2021  ? Respiratory failure ([place]) 06/28/2021  ? Right lower lobe pneumonia 06/28/2021  ? Slowing of urinary stream 06/28/2021  ? Type 2 diabetes mellitus without complications ([place]) 99/24/2683  ? Urinary frequency 06/28/2021  ? Abdominal pain 10/01/2016  ? Age-related physical debility 05/03/2016  ? Cellulitis of left foot 05/03/2016  ? COPD exacerbation ([place]) 05/03/2016  ? Essential hypertension 05/03/2016  ? Renal insufficiency 05/03/2016  ? Tobacco abuse 09/05/2014  ? CVA (cerebral infarction) 08/29/2013  ? Central retinal artery occlusion of right eye 08/28/2013  ? Acid reflux   ? Anxiety   ? Sleep apnea   ? Hypertensive emergency 08/27/2013  ? Enthesopathy 03/03/2000  ? Antisocial personality disorder ([place]) 02/17/1992  ? Cataplexy and narcolepsy 02/17/1992  ? ? ?Current Outpatient Medications on File Prior to Visit  ?Medication Sig Dispense Refill  ? albuterol (VENTOLIN HFA) 108 (90 Base) MCG/ACT inhaler INHALE 2 PUFFS BY MOUTH FOUR TIMES A DAY    ? fluticasone-salmeterol (ADVAIR) 250-50 MCG/ACT AEPB INHALE 1 INHALATION BY MOUTH TWICE A DAY FOR BREATHING (RINSE MOUTH WELL WITH WATER AFTER EACH USE)    ? ipratropium-albuterol (DUONEB) 0.5-2.5 (3) MG/3ML  SOLN INHALE 1 VIAL IN NEBULIZER BY MOUTH FOUR TIMES A DAY    ? metFORMIN (GLUCOPHAGE) 500 MG tablet Take 1 tablet by mouth 2 (two) times daily.     ? acetaminophen (TYLENOL) 325 MG tablet 3 tablets.    ? acetaminophen-codeine (TYLENOL #3) 300-30 MG tablet     ? allopurinol (ZYLOPRIM) 300 MG tablet Take 1 tablet by mouth daily.    ? amLODipine (NORVASC) 10 MG tablet Take 10 mg by mouth daily.    ? aspirin 81 MG chewable tablet CHEW ONE TABLET BY MOUTH EVERY DAY    ? budesonide-formoterol (SYMBICORT) 160-4.5 MCG/ACT inhaler INHALE 2 PUFFS BY MOUTH TWICE A DAY (RINSE MOUTH WELL WITH WATER AFTER EACH USE)    ? cetirizine (ZYRTEC) 10 MG tablet Take 10 mg by mouth daily as needed for allergies.    ? divalproex (DEPAKOTE ER) 250 MG 24 hr tablet Take 1 tablet by mouth at bedtime.    ? furosemide (LASIX) 40 MG tablet Take 1 tablet by mouth daily.  0  ? glipiZIDE (GLUCOTROL) 5 MG tablet 5 mg.    ? HYDROcodone-acetaminophen (NORCO/VICODIN) 5-325 MG tablet     ? lactulose (CHRONULAC) 10 GM/15ML solution Take 10 g by mouth 2 (two) times daily.    ? lisinopril-hydrochlorothiazide (PRINZIDE,ZESTORETIC) 20-12.5 MG tablet Take 1 tablet by mouth daily.    ? Magnesium Hydroxide (MILK OF MAGNESIA PO) TAKE 2 TABLESPOONFULS BY MOUTH ONCE A DAY AS NEEDED    ? Magnesium Oxide 420 MG TABS Take 1 tablet by mouth daily.    ? metoprolol (LOPRESSOR) 50 MG tablet Take 50 mg by mouth 2 (two) times daily.    ? pantoprazole (PROTONIX) 40 MG tablet Take 40 mg by mouth 2 (two) times daily.    ? QUEtiapine (SEROQUEL) 25 MG tablet 25 mg.    ? SENNA-S 8.6-50 MG tablet Take 2 tablets by mouth 2 (two) times daily.    ? tamsulosin (FLOMAX) 0.4 MG CAPS capsule Take 0.4 mg by mouth at bedtime.    ? venlafaxine XR (EFFEXOR-XR) 150 MG 24 hr capsule Take 300 mg by mouth daily.    ? vitamin B-12 (CYANOCOBALAMIN) 500 MCG tablet TAKE FOUR TABLETS BY MOUTH ONCE A DAY    ? vitamin C (ASCORBIC ACID) 500 MG tablet Take 500 mg by mouth daily.    ? ?No current facility-administered medications on file prior to visit.  ? ? ?Allergies  ?Allergen Reactions  ? Morphine And Related   ? Naproxen   ?  Other reaction(s):  FLUSHING  ? Other   ?  Theraflu ?Cold and Cough ME  ? Tiotropium   ? ? ?Objective: ?Physical Exam ? ?General: Well developed, nourished, no acute distress, awake, alert and oriented x 2 ? ?Dermatology: No open wounds.  Very minimal scab that is dry noted at the dorsal left second and right second toes likely from Patient bumping his toes due to his gait problem secondary to neuropathy, nails x10 are thickened and elongated consistent with onychomycosis ?  ?Vascular: Dorsalis Pedis pulse = 1/4 Bilateral,  Posterior Tibial pulse = 0/4 Bilateral, trace swelling bilateral lower extremities, capillary Fill Time < 5 seconds ?  ?Neurologic: Gross sensation present via light touch bilateral. ?  ?Musculosketal: There is no pain to palpation bilateral or to second toes bilateral.  No pain to palpation to heels.  No pain with compression to calves bilateral.  Asymptomatic hammertoe deformities noted bilateral. ? ? ?Assessment and Plan:  ?Problem List Items  Addressed This Visit   ?None ?Visit Diagnoses   ? ? Pain due to onychomycosis of toenails of both feet    -  Primary  ? PVD (peripheral vascular disease) ([place])      ? Vascular dementia with behavior disturbance      ? ?  ? ? ?-Examined patient.  ?-Discussed treatment options for painful mycotic nails. ?-Mechanically debrided and reduced all painful mycotic nails with sterile nail nipper and dremel nail file without incident ?-Advised patient and daughter to closely monitor dry scabs at the second toes bilateral if worsens return to office sooner ?-Dispensed Surgigrip compression sleeves for patient to wear as directed to assist with edema control  ?-Patient to return in 3 months for follow up evaluation/nail care with Dr. Blenda Mounts or sooner if symptoms worsen. ? ?Landis Martins, DPM ? ?

## 2021-10-15 DIAGNOSIS — J441 Chronic obstructive pulmonary disease with (acute) exacerbation: Secondary | ICD-10-CM | POA: Diagnosis not present

## 2021-10-15 DIAGNOSIS — J9601 Acute respiratory failure with hypoxia: Secondary | ICD-10-CM | POA: Diagnosis not present

## 2021-11-13 DIAGNOSIS — R42 Dizziness and giddiness: Secondary | ICD-10-CM | POA: Diagnosis not present

## 2021-11-13 DIAGNOSIS — F17210 Nicotine dependence, cigarettes, uncomplicated: Secondary | ICD-10-CM | POA: Diagnosis not present

## 2021-11-13 DIAGNOSIS — R519 Headache, unspecified: Secondary | ICD-10-CM | POA: Diagnosis not present

## 2021-11-13 DIAGNOSIS — R6889 Other general symptoms and signs: Secondary | ICD-10-CM | POA: Diagnosis not present

## 2021-11-13 DIAGNOSIS — J449 Chronic obstructive pulmonary disease, unspecified: Secondary | ICD-10-CM | POA: Diagnosis not present

## 2021-11-13 DIAGNOSIS — Z743 Need for continuous supervision: Secondary | ICD-10-CM | POA: Diagnosis not present

## 2021-11-13 DIAGNOSIS — R0902 Hypoxemia: Secondary | ICD-10-CM | POA: Diagnosis not present

## 2021-11-13 DIAGNOSIS — R404 Transient alteration of awareness: Secondary | ICD-10-CM | POA: Diagnosis not present

## 2021-11-13 DIAGNOSIS — I2510 Atherosclerotic heart disease of native coronary artery without angina pectoris: Secondary | ICD-10-CM | POA: Diagnosis not present

## 2021-11-13 DIAGNOSIS — I10 Essential (primary) hypertension: Secondary | ICD-10-CM | POA: Diagnosis not present

## 2021-11-13 DIAGNOSIS — E119 Type 2 diabetes mellitus without complications: Secondary | ICD-10-CM | POA: Diagnosis not present

## 2021-11-14 DIAGNOSIS — J441 Chronic obstructive pulmonary disease with (acute) exacerbation: Secondary | ICD-10-CM | POA: Diagnosis not present

## 2021-11-14 DIAGNOSIS — J9601 Acute respiratory failure with hypoxia: Secondary | ICD-10-CM | POA: Diagnosis not present

## 2021-12-15 DIAGNOSIS — J441 Chronic obstructive pulmonary disease with (acute) exacerbation: Secondary | ICD-10-CM | POA: Diagnosis not present

## 2021-12-15 DIAGNOSIS — J9601 Acute respiratory failure with hypoxia: Secondary | ICD-10-CM | POA: Diagnosis not present

## 2021-12-23 ENCOUNTER — Ambulatory Visit (INDEPENDENT_AMBULATORY_CARE_PROVIDER_SITE_OTHER): Payer: Medicare Other | Admitting: Podiatry

## 2021-12-23 ENCOUNTER — Encounter: Payer: Self-pay | Admitting: Podiatry

## 2021-12-23 DIAGNOSIS — J301 Allergic rhinitis due to pollen: Secondary | ICD-10-CM | POA: Insufficient documentation

## 2021-12-23 DIAGNOSIS — M79674 Pain in right toe(s): Secondary | ICD-10-CM

## 2021-12-23 DIAGNOSIS — M79675 Pain in left toe(s): Secondary | ICD-10-CM

## 2021-12-23 DIAGNOSIS — B351 Tinea unguium: Secondary | ICD-10-CM | POA: Diagnosis not present

## 2021-12-23 DIAGNOSIS — I739 Peripheral vascular disease, unspecified: Secondary | ICD-10-CM

## 2021-12-23 NOTE — Progress Notes (Signed)
Subjective: Luke Allen is a 87 y.o. male patient seen today in office with complaint of mildly painful thickened and elongated toenails; unable to trim. Patient is assisted by care taker this visit.   No other issues noted.  Patient Active Problem List   Diagnosis Date Noted   Abdominal aortic aneurysm without rupture ([place]) 06/28/2021   Actinic keratosis 06/28/2021   Acute and chronic respiratory failure with hypoxia ([place]) 06/28/2021   Acute kidney injury superimposed on chronic kidney disease ([place]) 06/28/2021   Agitation 06/28/2021   Allergic rhinitis 06/28/2021   Arthropathy of pelvic region and thigh 06/28/2021   Bradycardia 06/28/2021   CAD S/P percutaneous coronary angioplasty 06/28/2021   Cervicalgia 06/28/2021   Compression fracture of L3 vertebra ([place]) 06/28/2021   Coronary arteriosclerosis 06/28/2021   Other specified counseling 06/28/2021   Degeneration of intervertebral disc 06/28/2021   Delirium due to known physiological condition 06/28/2021   Dementia with behavioral disturbance ([place]) 06/28/2021   Diabetic foot ulcer ([place]) 06/28/2021   Diarrhea 06/28/2021   Difficulty in walking, not elsewhere classified 06/28/2021   Dysphagia, pharyngeal phase 06/28/2021   Dysphagia, unspecified 06/28/2021   Elevated alkaline phosphatase level 06/28/2021   Encounter for fitting and adjustment of hearing aid 06/28/2021   Family circumstance 06/28/2021   Gram-negative pneumonia ([place]) 06/28/2021   Hypertrophy of prostate without urinary obstruction and other lower urinary tract symptoms (LUTS) 06/28/2021   Hypopotassemia 06/28/2021   Infection and inflammatory reaction due to other internal orthopedic prosthetic devices, implants and grafts, initial encounter ([place]) 06/28/2021   Influenza B 06/28/2021   Inguinal hernia 06/28/2021   Low back pain 06/28/2021   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   Malignant neoplasm of prostate ([place]) 95/28/4132   Metabolic encephalopathy 44/07/270   Mixed  conductive and sensorineural hearing loss, unilateral, left ear with restricted hearing on the contralateral side 06/28/2021   Mononeuritis 06/28/2021   Multiple falls 06/28/2021   Nausea and vomiting 06/28/2021   Need for assistance with personal care 06/28/2021   Nicotine dependence 06/28/2021   Obesity 06/28/2021   Other abnormalities of gait and mobility 06/28/2021   Other interpersonal problem 06/28/2021   Pain in joint, shoulder region 06/28/2021   Psychosexual dysfunction with inhibited sexual excitement 06/28/2021   Respiratory failure ([place]) 06/28/2021   Right lower lobe pneumonia 06/28/2021   Slowing of urinary stream 06/28/2021   Type 2 diabetes mellitus without complications ([place]) 53/66/4403   Urinary frequency 06/28/2021   Abdominal pain 10/01/2016   Age-related physical debility 05/03/2016   Cellulitis of left foot 05/03/2016   COPD exacerbation ([place]) 05/03/2016   Essential hypertension 05/03/2016   Renal insufficiency 05/03/2016   Tobacco abuse 09/05/2014   CVA (cerebral infarction) 08/29/2013   Central retinal artery occlusion of right eye 08/28/2013   Acid reflux    Anxiety    Sleep apnea    Hypertensive emergency 08/27/2013   Enthesopathy 03/03/2000   Antisocial personality disorder ([place]) 02/17/1992   Cataplexy and narcolepsy 02/17/1992    Current Outpatient Medications on File Prior to Visit  Medication Sig Dispense Refill   acetaminophen (TYLENOL) 325 MG tablet 3 tablets.     acetaminophen-codeine (TYLENOL #3) 300-30 MG tablet      albuterol (VENTOLIN HFA) 108 (90 Base) MCG/ACT inhaler INHALE 2 PUFFS BY MOUTH FOUR TIMES A DAY     allopurinol (ZYLOPRIM) 300 MG tablet Take 1 tablet by mouth daily.     amLODipine (NORVASC) 10 MG tablet Take 10 mg by mouth daily.  aspirin 81 MG chewable tablet CHEW ONE TABLET BY MOUTH EVERY DAY     budesonide-formoterol (SYMBICORT) 160-4.5 MCG/ACT inhaler INHALE 2 PUFFS BY MOUTH TWICE A DAY (RINSE MOUTH WELL WITH WATER  AFTER EACH USE)     cetirizine (ZYRTEC) 10 MG tablet Take 10 mg by mouth daily as needed for allergies.     divalproex (DEPAKOTE ER) 250 MG 24 hr tablet Take 1 tablet by mouth at bedtime.     fluticasone-salmeterol (ADVAIR) 250-50 MCG/ACT AEPB INHALE 1 INHALATION BY MOUTH TWICE A DAY FOR BREATHING (RINSE MOUTH WELL WITH WATER AFTER EACH USE)     furosemide (LASIX) 40 MG tablet Take 1 tablet by mouth daily.  0   glipiZIDE (GLUCOTROL) 5 MG tablet 5 mg.     HYDROcodone-acetaminophen (NORCO/VICODIN) 5-325 MG tablet      ipratropium-albuterol (DUONEB) 0.5-2.5 (3) MG/3ML SOLN INHALE 1 VIAL IN NEBULIZER BY MOUTH FOUR TIMES A DAY     lactulose (CHRONULAC) 10 GM/15ML solution Take 10 g by mouth 2 (two) times daily.     lisinopril-hydrochlorothiazide (PRINZIDE,ZESTORETIC) 20-12.5 MG tablet Take 1 tablet by mouth daily.     Magnesium Hydroxide (MILK OF MAGNESIA PO) TAKE 2 TABLESPOONFULS BY MOUTH ONCE A DAY AS NEEDED     Magnesium Oxide 420 MG TABS Take 1 tablet by mouth daily.     metFORMIN (GLUCOPHAGE) 500 MG tablet Take 1 tablet by mouth 2 (two) times daily.     metoprolol (LOPRESSOR) 50 MG tablet Take 50 mg by mouth 2 (two) times daily.     pantoprazole (PROTONIX) 40 MG tablet Take 40 mg by mouth 2 (two) times daily.     QUEtiapine (SEROQUEL) 25 MG tablet 25 mg.     SENNA-S 8.6-50 MG tablet Take 2 tablets by mouth 2 (two) times daily.     tamsulosin (FLOMAX) 0.4 MG CAPS capsule Take 0.4 mg by mouth at bedtime.     venlafaxine XR (EFFEXOR-XR) 150 MG 24 hr capsule Take 300 mg by mouth daily.     vitamin B-12 (CYANOCOBALAMIN) 500 MCG tablet TAKE FOUR TABLETS BY MOUTH ONCE A DAY     vitamin C (ASCORBIC ACID) 500 MG tablet Take 500 mg by mouth daily.     No current facility-administered medications on file prior to visit.    Allergies  Allergen Reactions   Morphine And Related    Naproxen     Other reaction(s): FLUSHING   Other     Theraflu Cold and Cough ME   Tiotropium      Objective: Physical Exam  General: Well developed, nourished, no acute distress, awake, alert and oriented x 2  Dermatology: No open wounds.  Very minimal scab that is dry noted at the dorsal left second and right second toes likely from Patient bumping his toes due to his gait problem secondary to neuropathy, nails x10 are thickened and elongated consistent with onychomycosis   Vascular: Dorsalis Pedis pulse = 1/4 Bilateral,  Posterior Tibial pulse = 0/4 Bilateral, trace swelling bilateral lower extremities, capillary Fill Time < 5 seconds   Neurologic: Gross sensation present via light touch bilateral.   Musculosketal: There is no pain to palpation bilateral or to second toes bilateral.  No pain to palpation to heels.  No pain with compression to calves bilateral.  Asymptomatic hammertoe deformities noted bilateral.   Assessment and Plan:  Problem List Items Addressed This Visit   None Visit Diagnoses     Pain due to onychomycosis of toenails of both  feet    -  Primary   PVD (peripheral vascular disease) ([place])           -Discussed and educated patient on diabetic foot care, especially with  regards to the vascular, neurological and musculoskeletal systems.  -Stressed the importance of good glycemic control and the detriment of not  controlling glucose levels in relation to the foot. -Discussed supportive shoes at all times and checking feet regularly.  -Mechanically debrided all nails 1-5 bilateral using sterile nail nipper and filed with dremel without incident  -Answered all patient questions -Patient to return  in 3 months for at risk foot care -Patient advised to call the office if any problems or questions arise in the meantime.   Lorenda Peck, DPM

## 2022-01-14 DIAGNOSIS — J441 Chronic obstructive pulmonary disease with (acute) exacerbation: Secondary | ICD-10-CM | POA: Diagnosis not present

## 2022-01-14 DIAGNOSIS — J9601 Acute respiratory failure with hypoxia: Secondary | ICD-10-CM | POA: Diagnosis not present

## 2022-02-14 DIAGNOSIS — J9601 Acute respiratory failure with hypoxia: Secondary | ICD-10-CM | POA: Diagnosis not present

## 2022-02-14 DIAGNOSIS — J441 Chronic obstructive pulmonary disease with (acute) exacerbation: Secondary | ICD-10-CM | POA: Diagnosis not present

## 2022-02-27 DIAGNOSIS — J9611 Chronic respiratory failure with hypoxia: Secondary | ICD-10-CM | POA: Diagnosis not present

## 2022-02-27 DIAGNOSIS — Z9981 Dependence on supplemental oxygen: Secondary | ICD-10-CM | POA: Diagnosis not present

## 2022-02-27 DIAGNOSIS — I10 Essential (primary) hypertension: Secondary | ICD-10-CM | POA: Diagnosis not present

## 2022-02-27 DIAGNOSIS — E1165 Type 2 diabetes mellitus with hyperglycemia: Secondary | ICD-10-CM | POA: Diagnosis not present

## 2022-02-27 DIAGNOSIS — J449 Chronic obstructive pulmonary disease, unspecified: Secondary | ICD-10-CM | POA: Diagnosis not present

## 2022-03-03 ENCOUNTER — Encounter: Payer: Self-pay | Admitting: Podiatry

## 2022-03-03 ENCOUNTER — Ambulatory Visit (INDEPENDENT_AMBULATORY_CARE_PROVIDER_SITE_OTHER): Payer: Medicare Other | Admitting: Podiatry

## 2022-03-03 DIAGNOSIS — I739 Peripheral vascular disease, unspecified: Secondary | ICD-10-CM | POA: Diagnosis not present

## 2022-03-03 DIAGNOSIS — F01518 Vascular dementia, unspecified severity, with other behavioral disturbance: Secondary | ICD-10-CM | POA: Diagnosis not present

## 2022-03-03 DIAGNOSIS — M79675 Pain in left toe(s): Secondary | ICD-10-CM

## 2022-03-03 DIAGNOSIS — L84 Corns and callosities: Secondary | ICD-10-CM | POA: Diagnosis not present

## 2022-03-03 DIAGNOSIS — B351 Tinea unguium: Secondary | ICD-10-CM

## 2022-03-03 DIAGNOSIS — M79674 Pain in right toe(s): Secondary | ICD-10-CM | POA: Diagnosis not present

## 2022-03-03 NOTE — Progress Notes (Signed)
Subjective: Luke Allen is a 87 y.o. male patient seen today in office with new complaint of sores and scraps on the bottom of his feet as well as concern of mildly painful thickened and elongated toenails; unable to trim. Patient is assisted by care taker this visit.   No other issues noted.  Patient Active Problem List   Diagnosis Date Noted   Allergic rhinitis due to pollen 12/23/2021   Abdominal aortic aneurysm without rupture ([place]) 06/28/2021   Actinic keratosis 06/28/2021   Acute and chronic respiratory failure with hypoxia ([place]) 06/28/2021   Acute kidney injury superimposed on chronic kidney disease ([place]) 06/28/2021   Agitation 06/28/2021   Allergic rhinitis 06/28/2021   Arthropathy of pelvic region and thigh 06/28/2021   Bradycardia 06/28/2021   CAD S/P percutaneous coronary angioplasty 06/28/2021   Cervicalgia 06/28/2021   Compression fracture of L3 vertebra ([place]) 06/28/2021   Coronary arteriosclerosis 06/28/2021   Other specified counseling 06/28/2021   Degeneration of intervertebral disc 06/28/2021   Delirium due to known physiological condition 06/28/2021   Dementia with behavioral disturbance ([employer]) 06/28/2021   Diabetic foot ulcer ([place]) 06/28/2021   Diarrhea 06/28/2021   Difficulty in walking, not elsewhere classified 06/28/2021   Dysphagia, pharyngeal phase 06/28/2021   Dysphagia, unspecified 06/28/2021   Elevated alkaline phosphatase level 06/28/2021   Encounter for fitting and adjustment of hearing aid 06/28/2021   Family circumstance 06/28/2021   Gram-negative pneumonia ([place]) 06/28/2021   Hypertrophy of prostate without urinary obstruction and other lower urinary tract symptoms (LUTS) 06/28/2021   Hypopotassemia 06/28/2021   Infection and inflammatory reaction due to other internal orthopedic prosthetic devices, implants and grafts, initial encounter ([place]) 06/28/2021   Influenza B 06/28/2021   Inguinal hernia 06/28/2021   Low back pain 06/28/2021   Luetscher's  syndrome 06/28/2021   Malignant neoplasm of prostate ([place]) 22/63/3354   Metabolic encephalopathy 56/25/6389   Mixed conductive and sensorineural hearing loss, unilateral, left ear with restricted hearing on the contralateral side 06/28/2021   Mononeuritis 06/28/2021   Multiple falls 06/28/2021   Nausea and vomiting 06/28/2021   Need for assistance with personal care 06/28/2021   Nicotine dependence 06/28/2021   Obesity 06/28/2021   Other abnormalities of gait and mobility 06/28/2021   Other interpersonal problem 06/28/2021   Pain in joint, shoulder region 06/28/2021   Psychosexual dysfunction with inhibited sexual excitement 06/28/2021   Respiratory failure ([place]) 06/28/2021   Right lower lobe pneumonia 06/28/2021   Slowing of urinary stream 06/28/2021   Type 2 diabetes mellitus without complications ([place]) 37/34/2876   Urinary frequency 06/28/2021   Abdominal pain 10/01/2016   Age-related physical debility 05/03/2016   Cellulitis of left foot 05/03/2016   COPD exacerbation ([place]) 05/03/2016   Essential hypertension 05/03/2016   Renal insufficiency 05/03/2016   Tobacco abuse 09/05/2014   CVA (cerebral infarction) 08/29/2013   Central retinal artery occlusion of right eye 08/28/2013   Acid reflux    Anxiety    Sleep apnea    Hypertensive emergency 08/27/2013   Enthesopathy 03/03/2000   Antisocial personality disorder ([place]) 02/17/1992   Cataplexy and narcolepsy 02/17/1992    Current Outpatient Medications on File Prior to Visit  Medication Sig Dispense Refill   acetaminophen (TYLENOL) 500 MG tablet Take 1 tablet by mouth 2 (two) times daily as needed.     acetaminophen-codeine (TYLENOL #3) 300-30 MG tablet      albuterol (VENTOLIN HFA) 108 (90 Base) MCG/ACT inhaler INHALE 2 PUFFS BY MOUTH FOUR TIMES A DAY  allopurinol (ZYLOPRIM) 100 MG tablet TAKE ONE TABLET BY MOUTH ONCE A DAY FOR GOUT PREVENTION     allopurinol (ZYLOPRIM) 300 MG tablet Take 1 tablet by mouth daily.      amLODipine (NORVASC) 10 MG tablet Take 10 mg by mouth daily.     aspirin 81 MG chewable tablet CHEW ONE TABLET BY MOUTH EVERY DAY     budesonide-formoterol (SYMBICORT) 160-4.5 MCG/ACT inhaler INHALE 2 PUFFS BY MOUTH TWICE A DAY (RINSE MOUTH WELL WITH WATER AFTER EACH USE)     cetirizine (ZYRTEC) 10 MG tablet Take 10 mg by mouth daily as needed for allergies.     divalproex (DEPAKOTE ER) 250 MG 24 hr tablet Take 1 tablet by mouth at bedtime.     finasteride (PROSCAR) 5 MG tablet Take 1 tablet by mouth daily.     fluticasone-salmeterol (ADVAIR) 250-50 MCG/ACT AEPB INHALE 1 INHALATION BY MOUTH TWICE A DAY FOR BREATHING (RINSE MOUTH WELL WITH WATER AFTER EACH USE)     furosemide (LASIX) 40 MG tablet Take 1 tablet by mouth daily.  0   glipiZIDE (GLUCOTROL) 5 MG tablet 5 mg.     HYDROcodone-acetaminophen (NORCO/VICODIN) 5-325 MG tablet      ipratropium-albuterol (DUONEB) 0.5-2.5 (3) MG/3ML SOLN INHALE 1 VIAL IN NEBULIZER BY MOUTH FOUR TIMES A DAY     lactulose (CHRONULAC) 10 GM/15ML solution Take 10 g by mouth 2 (two) times daily.     lisinopril-hydrochlorothiazide (PRINZIDE,ZESTORETIC) 20-12.5 MG tablet Take 1 tablet by mouth daily.     Magnesium Hydroxide (MILK OF MAGNESIA PO) TAKE 2 TABLESPOONFULS BY MOUTH ONCE A DAY AS NEEDED     Magnesium Oxide 420 MG TABS Take 1 tablet by mouth daily.     metFORMIN (GLUCOPHAGE) 500 MG tablet TAKE ONE TABLET BY MOUTH DAILY FOR DIABETES (ANNUAL KIDNEY FUNCTION TESTING IS NEEDED) *DECREASE 11/04/21     metoprolol (LOPRESSOR) 50 MG tablet Take 50 mg by mouth 2 (two) times daily.     pantoprazole (PROTONIX) 40 MG tablet Take 40 mg by mouth 2 (two) times daily.     QUEtiapine (SEROQUEL) 25 MG tablet 25 mg.     SENNA-S 8.6-50 MG tablet Take 2 tablets by mouth 2 (two) times daily.     tamsulosin (FLOMAX) 0.4 MG CAPS capsule Take 0.4 mg by mouth at bedtime.     traZODone (DESYREL) 50 MG tablet TAKE ONE-HALF TABLET BY MOUTH AT BEDTIME AS NEEDED FOR INSOMNIA      venlafaxine XR (EFFEXOR-XR) 150 MG 24 hr capsule Take 300 mg by mouth daily.     vitamin B-12 (CYANOCOBALAMIN) 500 MCG tablet TAKE FOUR TABLETS BY MOUTH ONCE A DAY     vitamin C (ASCORBIC ACID) 500 MG tablet Take 500 mg by mouth daily.     No current facility-administered medications on file prior to visit.    Allergies  Allergen Reactions   Morphine And Related    Naproxen     Other reaction(s): FLUSHING   Other     Theraflu Cold and Cough ME   Tiotropium     Objective: Physical Exam  General: Well developed, nourished, no acute distress, awake, alert and oriented x 2  Dermatology: No open wounds.  Very minimal scab that is dry noted at the dorsal left second and right second toes likely from Patient bumping his toes due to his gait problem secondary to neuropathy as well as plantar left foot abrasion less than 0.1 cm. Hyperkeratotic lesions noted to left third and second  digits, nails x10 are thickened and elongated consistent with onychomycosis   Vascular: Dorsalis Pedis pulse = 1/4 Bilateral,  Posterior Tibial pulse = 0/4 Bilateral, trace swelling bilateral lower extremities, capillary Fill Time < 5 seconds   Neurologic: Gross sensation present via light touch bilateral.   Musculosketal: There is no pain to palpation bilateral or to second toes bilateral.  No pain to palpation to heels.  No pain with compression to calves bilateral.  Asymptomatic hammertoe deformities noted bilateral.   Assessment and Plan:  Problem List Items Addressed This Visit   None Visit Diagnoses     Pain due to onychomycosis of toenails of both feet    -  Primary   PVD (peripheral vascular disease) ([place])       Vascular dementia with behavior disturbance ([place])       Pre-ulcerative calluses            -Discussed and educated patient on diabetic foot care, especially with  regards to the vascular, neurological and musculoskeletal systems.  -Stressed the importance of good glycemic control and  the detriment of not  controlling glucose levels in relation to the foot. -Discussed supportive shoes at all times and checking feet regularly.  -Mechanically debrided all nails 1-5 bilateral using sterile nail nipper and filed with dremel without incident  -Hyperkeratotic tissue debrided without incident with chisel.  -Advised to keep betadine and band aid over abrasion areas until healed.  -Answered all patient questions -Patient to return  in 3 months for at risk foot care -Patient advised to call the office if any problems or questions arise in the meantime.   Lorenda Peck, DPM

## 2022-03-17 DIAGNOSIS — J9601 Acute respiratory failure with hypoxia: Secondary | ICD-10-CM | POA: Diagnosis not present

## 2022-03-17 DIAGNOSIS — J441 Chronic obstructive pulmonary disease with (acute) exacerbation: Secondary | ICD-10-CM | POA: Diagnosis not present

## 2022-03-24 ENCOUNTER — Ambulatory Visit: Payer: Medicare Other | Admitting: Podiatry

## 2022-04-16 DIAGNOSIS — J9601 Acute respiratory failure with hypoxia: Secondary | ICD-10-CM | POA: Diagnosis not present

## 2022-04-16 DIAGNOSIS — J441 Chronic obstructive pulmonary disease with (acute) exacerbation: Secondary | ICD-10-CM | POA: Diagnosis not present

## 2022-04-30 ENCOUNTER — Telehealth: Payer: Self-pay

## 2022-04-30 NOTE — Patient Outreach (Signed)
  Care Coordination   04/30/2022 Name: Luke Allen MRN: [id number] DOB: 09/22/1934   Care Coordination Outreach Attempts:  An unsuccessful telephone outreach was attempted today to offer the patient information about available care coordination services as a benefit of their health plan.   Follow Up Plan:  Additional outreach attempts will be made to offer the patient care coordination information and services.   Encounter Outcome:  No Answer  Care Coordination Interventions Activated:  No   Care Coordination Interventions:  No, not indicated   Tomasa Rand, RN, BSN, CEN [facility] [employer] [phone]

## 2022-05-09 ENCOUNTER — Telehealth: Payer: Self-pay

## 2022-05-09 NOTE — Patient Outreach (Signed)
  Care Coordination   Initial Visit Note   05/09/2022 Name: Luke Allen MRN: [id number] DOB: [birth year]  Luke Allen is Allen 87 y.o. year old male who sees Luke Allen, Luke A, NP for primary care. I spoke with  Luke Allen by phone today. Patient provided me permission to speak with Luke Allen ( friend)  What matters to the patients health and wellness today?  Reviewed [facility] care coordination program.  Luke Allen reports all needs being met.  Declines services.   SDOH assessments and interventions completed:  No     Care Coordination Interventions Activated:  No  Care Coordination Interventions:  No, not indicated   Follow up plan: No further intervention required.   Encounter Outcome:  Pt. Refused   Luke Rand, RN, BSN, CEN [facility] [employer] [phone]

## 2022-05-17 DIAGNOSIS — J441 Chronic obstructive pulmonary disease with (acute) exacerbation: Secondary | ICD-10-CM | POA: Diagnosis not present

## 2022-05-17 DIAGNOSIS — J9601 Acute respiratory failure with hypoxia: Secondary | ICD-10-CM | POA: Diagnosis not present

## 2022-06-03 ENCOUNTER — Ambulatory Visit (INDEPENDENT_AMBULATORY_CARE_PROVIDER_SITE_OTHER): Payer: Medicare Other | Admitting: Podiatry

## 2022-06-03 DIAGNOSIS — M79674 Pain in right toe(s): Secondary | ICD-10-CM | POA: Diagnosis not present

## 2022-06-03 DIAGNOSIS — M79675 Pain in left toe(s): Secondary | ICD-10-CM | POA: Diagnosis not present

## 2022-06-03 DIAGNOSIS — L97521 Non-pressure chronic ulcer of other part of left foot limited to breakdown of skin: Secondary | ICD-10-CM

## 2022-06-03 DIAGNOSIS — L03116 Cellulitis of left lower limb: Secondary | ICD-10-CM | POA: Diagnosis not present

## 2022-06-03 DIAGNOSIS — B351 Tinea unguium: Secondary | ICD-10-CM | POA: Diagnosis not present

## 2022-06-03 DIAGNOSIS — L84 Corns and callosities: Secondary | ICD-10-CM

## 2022-06-03 DIAGNOSIS — F01518 Vascular dementia, unspecified severity, with other behavioral disturbance: Secondary | ICD-10-CM

## 2022-06-03 DIAGNOSIS — I739 Peripheral vascular disease, unspecified: Secondary | ICD-10-CM

## 2022-06-03 MED ORDER — CEPHALEXIN 500 MG PO CAPS: 500.0000 mg | ORAL_CAPSULE | Freq: Three times a day (TID) | ORAL | 0 refills | Status: AC

## 2022-06-03 NOTE — Progress Notes (Signed)
  Subjective:  Patient ID: Luke Allen, male    DOB: 1935-05-03,  MRN: [id number]  Chief Complaint  Patient presents with   Nail Problem    RFC    87 y.o. male presents with the above complaint. History confirmed with patient. Patient presenting with pain related to dystrophic thickened elongated nails. Patient is unable to trim own nails related to nail dystrophy and/or mobility issues. Patient does have a history of T2DM.  Patient also has preulcerative lesions present on both feet at the second and third toe of the left foot and the second toe of the right foot.  Patient has also noticed some increasing soreness in his left foot as well as some redness in the left foot.  There is a new scrape that is occurred on the outside of the foot not sure how that happened.  Patient has a history of dementia.  Objective:  Physical Exam: warm, good capillary refill nail exam onychomycosis of the toenails, onycholysis, and dystrophic nails protective sensation absent DP pulse absent left lower extremity.  PT pulses weakly palpable gait. Left Foot:  Pain with palpation of nails due to elongation and dystrophic growth.  At the lateral aspect of the left foot there is noted to be a small superficial abrasion with some eschar present with fibrotic tissue overlying.  No drainage mild erythema surrounding.  Overall consistent with a neuropathic abrasion type injury that is slow to heal due to peripheral vascular disease  Right Foot: Pain with palpation of nails due to elongation and dystrophic growth.   Assessment:   1. Pain due to onychomycosis of toenails of both feet   2. Ulcer of left foot, limited to breakdown of skin (HCC)   3. Cellulitis of left foot   4. PVD (peripheral vascular disease) ([place])   5. Pre-ulcerative calluses   6. Vascular dementia with behavior disturbance ([place])      Plan:  Patient was evaluated and treated and all questions answered.  #Ulcer of left lateral midfoot limited to  breakdown of skin with mild surrounding cellulitis -E- Rx for cephalexin 500 mg 3 times daily for 7 days for cellulitis of the left lower extremity. -Recommend daily Betadine paint to the scabs on the outside of the left foot.  Does not need a dressing there is no drainage -.  Monitor for worsening redness or opening the wound.  If he has nausea vomiting fever chills or sees increasing redness swelling or drainage report to the emergency department for further evaluation.  #Hyperkeratotic lesions/pre ulcerative calluses present at the distal tuft of the right second toe as well as the left second toe and third toe All symptomatic hyperkeratoses x 3 separate lesions were safely debrided with a sterile #10 blade to patient's level of comfort without incident. We discussed preventative and palliative care of these lesions including supportive and accommodative shoegear, padding, prefabricated and custom molded accommodative orthoses, use of a pumice stone and lotions/creams daily.  #Onychomycosis with pain  -Nails palliatively debrided as below. -Educated on self-care  Procedure: Nail Debridement Rationale: Pain Type of Debridement: manual, sharp debridement. Instrumentation: Nail nipper, rotary burr. Number of Nails: 10  Return in about 3 weeks (around 06/24/2022) for Follow-up left lateral midfoot ulcer.         Everitt Amber, DPM [facility]

## 2022-06-15 DIAGNOSIS — B338 Other specified viral diseases: Secondary | ICD-10-CM | POA: Diagnosis not present

## 2022-06-15 DIAGNOSIS — R531 Weakness: Secondary | ICD-10-CM | POA: Diagnosis not present

## 2022-06-15 DIAGNOSIS — R9431 Abnormal electrocardiogram [ECG] [EKG]: Secondary | ICD-10-CM | POA: Diagnosis not present

## 2022-06-15 DIAGNOSIS — R059 Cough, unspecified: Secondary | ICD-10-CM | POA: Diagnosis not present

## 2022-06-15 DIAGNOSIS — I700 Atherosclerosis of aorta: Secondary | ICD-10-CM | POA: Diagnosis not present

## 2022-06-15 DIAGNOSIS — R5383 Other fatigue: Secondary | ICD-10-CM | POA: Diagnosis not present

## 2022-06-16 DIAGNOSIS — J440 Chronic obstructive pulmonary disease with acute lower respiratory infection: Secondary | ICD-10-CM | POA: Diagnosis not present

## 2022-06-16 DIAGNOSIS — F32A Depression, unspecified: Secondary | ICD-10-CM | POA: Diagnosis not present

## 2022-06-16 DIAGNOSIS — B338 Other specified viral diseases: Secondary | ICD-10-CM | POA: Diagnosis not present

## 2022-06-16 DIAGNOSIS — I10 Essential (primary) hypertension: Secondary | ICD-10-CM | POA: Diagnosis not present

## 2022-06-16 DIAGNOSIS — Z79899 Other long term (current) drug therapy: Secondary | ICD-10-CM | POA: Diagnosis not present

## 2022-06-16 DIAGNOSIS — R531 Weakness: Secondary | ICD-10-CM | POA: Diagnosis not present

## 2022-06-16 DIAGNOSIS — E119 Type 2 diabetes mellitus without complications: Secondary | ICD-10-CM | POA: Diagnosis not present

## 2022-06-16 DIAGNOSIS — Z9981 Dependence on supplemental oxygen: Secondary | ICD-10-CM | POA: Diagnosis not present

## 2022-06-16 DIAGNOSIS — J441 Chronic obstructive pulmonary disease with (acute) exacerbation: Secondary | ICD-10-CM | POA: Diagnosis not present

## 2022-06-16 DIAGNOSIS — I252 Old myocardial infarction: Secondary | ICD-10-CM | POA: Diagnosis not present

## 2022-06-16 DIAGNOSIS — I2510 Atherosclerotic heart disease of native coronary artery without angina pectoris: Secondary | ICD-10-CM | POA: Diagnosis not present

## 2022-06-16 DIAGNOSIS — J9621 Acute and chronic respiratory failure with hypoxia: Secondary | ICD-10-CM | POA: Diagnosis not present

## 2022-06-16 DIAGNOSIS — E785 Hyperlipidemia, unspecified: Secondary | ICD-10-CM | POA: Diagnosis not present

## 2022-06-16 DIAGNOSIS — Z7984 Long term (current) use of oral hypoglycemic drugs: Secondary | ICD-10-CM | POA: Diagnosis not present

## 2022-06-16 DIAGNOSIS — R9431 Abnormal electrocardiogram [ECG] [EKG]: Secondary | ICD-10-CM | POA: Diagnosis not present

## 2022-06-16 DIAGNOSIS — F17210 Nicotine dependence, cigarettes, uncomplicated: Secondary | ICD-10-CM | POA: Diagnosis not present

## 2022-06-16 DIAGNOSIS — J9601 Acute respiratory failure with hypoxia: Secondary | ICD-10-CM | POA: Diagnosis not present

## 2022-06-16 DIAGNOSIS — R059 Cough, unspecified: Secondary | ICD-10-CM | POA: Diagnosis not present

## 2022-06-16 DIAGNOSIS — I700 Atherosclerosis of aorta: Secondary | ICD-10-CM | POA: Diagnosis not present

## 2022-06-16 DIAGNOSIS — Z1152 Encounter for screening for COVID-19: Secondary | ICD-10-CM | POA: Diagnosis not present

## 2022-06-16 DIAGNOSIS — Z7982 Long term (current) use of aspirin: Secondary | ICD-10-CM | POA: Diagnosis not present

## 2022-06-16 DIAGNOSIS — R5383 Other fatigue: Secondary | ICD-10-CM | POA: Diagnosis not present

## 2022-06-16 DIAGNOSIS — M1990 Unspecified osteoarthritis, unspecified site: Secondary | ICD-10-CM | POA: Diagnosis not present

## 2022-06-16 DIAGNOSIS — Z8673 Personal history of transient ischemic attack (TIA), and cerebral infarction without residual deficits: Secondary | ICD-10-CM | POA: Diagnosis not present

## 2022-06-23 DIAGNOSIS — M545 Low back pain, unspecified: Secondary | ICD-10-CM | POA: Diagnosis not present

## 2022-06-23 DIAGNOSIS — J9621 Acute and chronic respiratory failure with hypoxia: Secondary | ICD-10-CM | POA: Diagnosis not present

## 2022-06-23 DIAGNOSIS — E1165 Type 2 diabetes mellitus with hyperglycemia: Secondary | ICD-10-CM | POA: Diagnosis not present

## 2022-06-23 DIAGNOSIS — I5032 Chronic diastolic (congestive) heart failure: Secondary | ICD-10-CM | POA: Diagnosis not present

## 2022-06-23 DIAGNOSIS — K219 Gastro-esophageal reflux disease without esophagitis: Secondary | ICD-10-CM | POA: Diagnosis not present

## 2022-06-23 DIAGNOSIS — G8929 Other chronic pain: Secondary | ICD-10-CM | POA: Diagnosis not present

## 2022-06-23 DIAGNOSIS — J440 Chronic obstructive pulmonary disease with acute lower respiratory infection: Secondary | ICD-10-CM | POA: Diagnosis not present

## 2022-06-23 DIAGNOSIS — H5461 Unqualified visual loss, right eye, normal vision left eye: Secondary | ICD-10-CM | POA: Diagnosis not present

## 2022-06-23 DIAGNOSIS — F17210 Nicotine dependence, cigarettes, uncomplicated: Secondary | ICD-10-CM | POA: Diagnosis not present

## 2022-06-23 DIAGNOSIS — I252 Old myocardial infarction: Secondary | ICD-10-CM | POA: Diagnosis not present

## 2022-06-23 DIAGNOSIS — Z9981 Dependence on supplemental oxygen: Secondary | ICD-10-CM | POA: Diagnosis not present

## 2022-06-23 DIAGNOSIS — E785 Hyperlipidemia, unspecified: Secondary | ICD-10-CM | POA: Diagnosis not present

## 2022-06-23 DIAGNOSIS — I110 Hypertensive heart disease with heart failure: Secondary | ICD-10-CM | POA: Diagnosis not present

## 2022-06-23 DIAGNOSIS — M1990 Unspecified osteoarthritis, unspecified site: Secondary | ICD-10-CM | POA: Diagnosis not present

## 2022-06-23 DIAGNOSIS — Z7952 Long term (current) use of systemic steroids: Secondary | ICD-10-CM | POA: Diagnosis not present

## 2022-06-23 DIAGNOSIS — G4733 Obstructive sleep apnea (adult) (pediatric): Secondary | ICD-10-CM | POA: Diagnosis not present

## 2022-06-23 DIAGNOSIS — E538 Deficiency of other specified B group vitamins: Secondary | ICD-10-CM | POA: Diagnosis not present

## 2022-06-23 DIAGNOSIS — I2510 Atherosclerotic heart disease of native coronary artery without angina pectoris: Secondary | ICD-10-CM | POA: Diagnosis not present

## 2022-06-23 DIAGNOSIS — Z792 Long term (current) use of antibiotics: Secondary | ICD-10-CM | POA: Diagnosis not present

## 2022-06-23 DIAGNOSIS — G4711 Idiopathic hypersomnia with long sleep time: Secondary | ICD-10-CM | POA: Diagnosis not present

## 2022-06-23 DIAGNOSIS — J441 Chronic obstructive pulmonary disease with (acute) exacerbation: Secondary | ICD-10-CM | POA: Diagnosis not present

## 2022-06-23 DIAGNOSIS — F32A Depression, unspecified: Secondary | ICD-10-CM | POA: Diagnosis not present

## 2022-06-23 DIAGNOSIS — N289 Disorder of kidney and ureter, unspecified: Secondary | ICD-10-CM | POA: Diagnosis not present

## 2022-06-27 DIAGNOSIS — J690 Pneumonitis due to inhalation of food and vomit: Secondary | ICD-10-CM | POA: Diagnosis not present

## 2022-06-27 DIAGNOSIS — E785 Hyperlipidemia, unspecified: Secondary | ICD-10-CM | POA: Diagnosis not present

## 2022-06-27 DIAGNOSIS — J441 Chronic obstructive pulmonary disease with (acute) exacerbation: Secondary | ICD-10-CM | POA: Diagnosis not present

## 2022-06-27 DIAGNOSIS — M1990 Unspecified osteoarthritis, unspecified site: Secondary | ICD-10-CM | POA: Diagnosis not present

## 2022-06-27 DIAGNOSIS — G4711 Idiopathic hypersomnia with long sleep time: Secondary | ICD-10-CM | POA: Diagnosis not present

## 2022-06-27 DIAGNOSIS — R6889 Other general symptoms and signs: Secondary | ICD-10-CM | POA: Diagnosis not present

## 2022-06-27 DIAGNOSIS — I2510 Atherosclerotic heart disease of native coronary artery without angina pectoris: Secondary | ICD-10-CM | POA: Diagnosis not present

## 2022-06-27 DIAGNOSIS — E538 Deficiency of other specified B group vitamins: Secondary | ICD-10-CM | POA: Diagnosis not present

## 2022-06-27 DIAGNOSIS — M545 Low back pain, unspecified: Secondary | ICD-10-CM | POA: Diagnosis not present

## 2022-06-27 DIAGNOSIS — R092 Respiratory arrest: Secondary | ICD-10-CM | POA: Diagnosis not present

## 2022-06-27 DIAGNOSIS — Z9981 Dependence on supplemental oxygen: Secondary | ICD-10-CM | POA: Diagnosis not present

## 2022-06-27 DIAGNOSIS — G8929 Other chronic pain: Secondary | ICD-10-CM | POA: Diagnosis not present

## 2022-06-27 DIAGNOSIS — E1165 Type 2 diabetes mellitus with hyperglycemia: Secondary | ICD-10-CM | POA: Diagnosis not present

## 2022-06-27 DIAGNOSIS — R531 Weakness: Secondary | ICD-10-CM | POA: Diagnosis not present

## 2022-06-27 DIAGNOSIS — K219 Gastro-esophageal reflux disease without esophagitis: Secondary | ICD-10-CM | POA: Diagnosis not present

## 2022-06-27 DIAGNOSIS — J9621 Acute and chronic respiratory failure with hypoxia: Secondary | ICD-10-CM | POA: Diagnosis not present

## 2022-06-27 DIAGNOSIS — G4733 Obstructive sleep apnea (adult) (pediatric): Secondary | ICD-10-CM | POA: Diagnosis not present

## 2022-06-27 DIAGNOSIS — I10 Essential (primary) hypertension: Secondary | ICD-10-CM | POA: Diagnosis not present

## 2022-06-27 DIAGNOSIS — F17210 Nicotine dependence, cigarettes, uncomplicated: Secondary | ICD-10-CM | POA: Diagnosis not present

## 2022-06-27 DIAGNOSIS — J449 Chronic obstructive pulmonary disease, unspecified: Secondary | ICD-10-CM | POA: Diagnosis not present

## 2022-06-27 DIAGNOSIS — R739 Hyperglycemia, unspecified: Secondary | ICD-10-CM | POA: Diagnosis not present

## 2022-06-27 DIAGNOSIS — I252 Old myocardial infarction: Secondary | ICD-10-CM | POA: Diagnosis not present

## 2022-06-27 DIAGNOSIS — H5461 Unqualified visual loss, right eye, normal vision left eye: Secondary | ICD-10-CM | POA: Diagnosis not present

## 2022-06-27 DIAGNOSIS — Z743 Need for continuous supervision: Secondary | ICD-10-CM | POA: Diagnosis not present

## 2022-06-27 DIAGNOSIS — Z792 Long term (current) use of antibiotics: Secondary | ICD-10-CM | POA: Diagnosis not present

## 2022-06-27 DIAGNOSIS — I5032 Chronic diastolic (congestive) heart failure: Secondary | ICD-10-CM | POA: Diagnosis not present

## 2022-06-27 DIAGNOSIS — Z7952 Long term (current) use of systemic steroids: Secondary | ICD-10-CM | POA: Diagnosis not present

## 2022-06-27 DIAGNOSIS — I110 Hypertensive heart disease with heart failure: Secondary | ICD-10-CM | POA: Diagnosis not present

## 2022-06-27 DIAGNOSIS — F32A Depression, unspecified: Secondary | ICD-10-CM | POA: Diagnosis not present

## 2022-06-27 DIAGNOSIS — N289 Disorder of kidney and ureter, unspecified: Secondary | ICD-10-CM | POA: Diagnosis not present

## 2022-06-27 DIAGNOSIS — U071 COVID-19: Secondary | ICD-10-CM | POA: Diagnosis not present

## 2022-06-27 DIAGNOSIS — J440 Chronic obstructive pulmonary disease with acute lower respiratory infection: Secondary | ICD-10-CM | POA: Diagnosis not present

## 2022-06-27 DIAGNOSIS — I951 Orthostatic hypotension: Secondary | ICD-10-CM | POA: Diagnosis not present

## 2022-06-28 DIAGNOSIS — J9621 Acute and chronic respiratory failure with hypoxia: Secondary | ICD-10-CM | POA: Diagnosis not present

## 2022-06-28 DIAGNOSIS — U071 COVID-19: Secondary | ICD-10-CM | POA: Diagnosis not present

## 2022-06-28 DIAGNOSIS — J690 Pneumonitis due to inhalation of food and vomit: Secondary | ICD-10-CM | POA: Diagnosis not present

## 2022-06-29 DIAGNOSIS — J690 Pneumonitis due to inhalation of food and vomit: Secondary | ICD-10-CM | POA: Diagnosis not present

## 2022-06-29 DIAGNOSIS — U071 COVID-19: Secondary | ICD-10-CM | POA: Diagnosis not present

## 2022-06-29 DIAGNOSIS — J9621 Acute and chronic respiratory failure with hypoxia: Secondary | ICD-10-CM | POA: Diagnosis not present

## 2022-06-30 DIAGNOSIS — J439 Emphysema, unspecified: Secondary | ICD-10-CM | POA: Diagnosis not present

## 2022-06-30 DIAGNOSIS — I2510 Atherosclerotic heart disease of native coronary artery without angina pectoris: Secondary | ICD-10-CM | POA: Diagnosis not present

## 2022-06-30 DIAGNOSIS — J22 Unspecified acute lower respiratory infection: Secondary | ICD-10-CM | POA: Diagnosis not present

## 2022-06-30 DIAGNOSIS — Z1621 Resistance to vancomycin: Secondary | ICD-10-CM | POA: Diagnosis not present

## 2022-06-30 DIAGNOSIS — E222 Syndrome of inappropriate secretion of antidiuretic hormone: Secondary | ICD-10-CM | POA: Diagnosis not present

## 2022-06-30 DIAGNOSIS — R531 Weakness: Secondary | ICD-10-CM | POA: Diagnosis not present

## 2022-06-30 DIAGNOSIS — N390 Urinary tract infection, site not specified: Secondary | ICD-10-CM | POA: Diagnosis not present

## 2022-06-30 DIAGNOSIS — J449 Chronic obstructive pulmonary disease, unspecified: Secondary | ICD-10-CM | POA: Diagnosis not present

## 2022-06-30 DIAGNOSIS — I10 Essential (primary) hypertension: Secondary | ICD-10-CM | POA: Diagnosis not present

## 2022-06-30 DIAGNOSIS — J9811 Atelectasis: Secondary | ICD-10-CM | POA: Diagnosis not present

## 2022-06-30 DIAGNOSIS — J441 Chronic obstructive pulmonary disease with (acute) exacerbation: Secondary | ICD-10-CM | POA: Diagnosis not present

## 2022-06-30 DIAGNOSIS — J690 Pneumonitis due to inhalation of food and vomit: Secondary | ICD-10-CM | POA: Diagnosis not present

## 2022-06-30 DIAGNOSIS — Z7952 Long term (current) use of systemic steroids: Secondary | ICD-10-CM | POA: Diagnosis not present

## 2022-06-30 DIAGNOSIS — Z1629 Resistance to other single specified antibiotic: Secondary | ICD-10-CM | POA: Diagnosis not present

## 2022-06-30 DIAGNOSIS — R918 Other nonspecific abnormal finding of lung field: Secondary | ICD-10-CM | POA: Diagnosis not present

## 2022-06-30 DIAGNOSIS — I3139 Other pericardial effusion (noninflammatory): Secondary | ICD-10-CM | POA: Diagnosis not present

## 2022-06-30 DIAGNOSIS — I340 Nonrheumatic mitral (valve) insufficiency: Secondary | ICD-10-CM | POA: Diagnosis not present

## 2022-06-30 DIAGNOSIS — J9690 Respiratory failure, unspecified, unspecified whether with hypoxia or hypercapnia: Secondary | ICD-10-CM | POA: Diagnosis not present

## 2022-06-30 DIAGNOSIS — M109 Gout, unspecified: Secondary | ICD-10-CM | POA: Diagnosis not present

## 2022-06-30 DIAGNOSIS — E119 Type 2 diabetes mellitus without complications: Secondary | ICD-10-CM | POA: Diagnosis not present

## 2022-06-30 DIAGNOSIS — R0902 Hypoxemia: Secondary | ICD-10-CM | POA: Diagnosis not present

## 2022-06-30 DIAGNOSIS — J811 Chronic pulmonary edema: Secondary | ICD-10-CM | POA: Diagnosis not present

## 2022-06-30 DIAGNOSIS — B952 Enterococcus as the cause of diseases classified elsewhere: Secondary | ICD-10-CM | POA: Diagnosis not present

## 2022-06-30 DIAGNOSIS — J90 Pleural effusion, not elsewhere classified: Secondary | ICD-10-CM | POA: Diagnosis not present

## 2022-06-30 DIAGNOSIS — F32A Depression, unspecified: Secondary | ICD-10-CM | POA: Diagnosis not present

## 2022-06-30 DIAGNOSIS — F17210 Nicotine dependence, cigarettes, uncomplicated: Secondary | ICD-10-CM | POA: Diagnosis not present

## 2022-06-30 DIAGNOSIS — I951 Orthostatic hypotension: Secondary | ICD-10-CM | POA: Diagnosis not present

## 2022-06-30 DIAGNOSIS — J9621 Acute and chronic respiratory failure with hypoxia: Secondary | ICD-10-CM | POA: Diagnosis not present

## 2022-06-30 DIAGNOSIS — M1990 Unspecified osteoarthritis, unspecified site: Secondary | ICD-10-CM | POA: Diagnosis not present

## 2022-06-30 DIAGNOSIS — U071 COVID-19: Secondary | ICD-10-CM | POA: Diagnosis not present

## 2022-07-03 DIAGNOSIS — R0902 Hypoxemia: Secondary | ICD-10-CM | POA: Diagnosis not present

## 2022-07-03 DIAGNOSIS — R918 Other nonspecific abnormal finding of lung field: Secondary | ICD-10-CM | POA: Diagnosis not present

## 2022-07-03 DIAGNOSIS — J811 Chronic pulmonary edema: Secondary | ICD-10-CM | POA: Diagnosis not present

## 2022-07-04 DIAGNOSIS — J90 Pleural effusion, not elsewhere classified: Secondary | ICD-10-CM | POA: Diagnosis not present

## 2022-07-04 DIAGNOSIS — J439 Emphysema, unspecified: Secondary | ICD-10-CM | POA: Diagnosis not present

## 2022-07-04 DIAGNOSIS — I3139 Other pericardial effusion (noninflammatory): Secondary | ICD-10-CM | POA: Diagnosis not present

## 2022-07-04 DIAGNOSIS — J9811 Atelectasis: Secondary | ICD-10-CM | POA: Diagnosis not present

## 2022-07-07 DIAGNOSIS — J9621 Acute and chronic respiratory failure with hypoxia: Secondary | ICD-10-CM

## 2022-07-07 DIAGNOSIS — J449 Chronic obstructive pulmonary disease, unspecified: Secondary | ICD-10-CM | POA: Diagnosis not present

## 2022-07-10 DIAGNOSIS — R918 Other nonspecific abnormal finding of lung field: Secondary | ICD-10-CM | POA: Diagnosis not present

## 2022-07-10 DIAGNOSIS — J439 Emphysema, unspecified: Secondary | ICD-10-CM | POA: Diagnosis not present

## 2022-07-10 DIAGNOSIS — J9690 Respiratory failure, unspecified, unspecified whether with hypoxia or hypercapnia: Secondary | ICD-10-CM | POA: Diagnosis not present

## 2022-07-10 DIAGNOSIS — J9621 Acute and chronic respiratory failure with hypoxia: Secondary | ICD-10-CM

## 2022-07-26 DIAGNOSIS — I493 Ventricular premature depolarization: Secondary | ICD-10-CM

## 2022-08-14 DEATH — deceased
